# Patient Record
Sex: Female | Born: 1940
Health system: Southern US, Community
[De-identification: ages and names within clinical notes are randomized; demographics above are authoritative.]

## PROBLEM LIST (undated history)

## (undated) DIAGNOSIS — Z972 Presence of dental prosthetic device (complete) (partial): Secondary | ICD-10-CM

## (undated) DIAGNOSIS — E669 Obesity, unspecified: Secondary | ICD-10-CM

## (undated) DIAGNOSIS — H269 Unspecified cataract: Secondary | ICD-10-CM

## (undated) DIAGNOSIS — E119 Type 2 diabetes mellitus without complications: Secondary | ICD-10-CM

## (undated) DIAGNOSIS — E785 Hyperlipidemia, unspecified: Secondary | ICD-10-CM

## (undated) DIAGNOSIS — H409 Unspecified glaucoma: Secondary | ICD-10-CM

## (undated) DIAGNOSIS — I10 Essential (primary) hypertension: Secondary | ICD-10-CM

## (undated) HISTORY — DX: Unspecified glaucoma: H40.9

## (undated) HISTORY — PX: BREAST BIOPSY: SHX20

## (undated) HISTORY — DX: Type 2 diabetes mellitus without complications: E11.9

## (undated) HISTORY — DX: Unspecified cataract: H26.9

## (undated) HISTORY — DX: Hyperlipidemia, unspecified: E78.5

## (undated) HISTORY — DX: Obesity, unspecified: E66.9

## (undated) HISTORY — DX: Essential (primary) hypertension: I10

---

## 1980-05-24 HISTORY — PX: BREAST EXCISIONAL BIOPSY: SUR124

## 1980-05-24 HISTORY — PX: BREAST LUMPECTOMY: SHX2

## 1984-05-24 HISTORY — PX: VAGINAL HYSTERECTOMY: SUR661

## 2004-06-03 ENCOUNTER — Ambulatory Visit: Payer: Self-pay | Admitting: Unknown Physician Specialty

## 2005-06-10 ENCOUNTER — Ambulatory Visit: Payer: Self-pay | Admitting: Unknown Physician Specialty

## 2006-08-03 ENCOUNTER — Ambulatory Visit: Payer: Self-pay | Admitting: Unknown Physician Specialty

## 2007-08-31 ENCOUNTER — Ambulatory Visit: Payer: Self-pay | Admitting: Unknown Physician Specialty

## 2008-04-24 ENCOUNTER — Ambulatory Visit: Payer: Self-pay | Admitting: Unknown Physician Specialty

## 2008-04-24 LAB — HM COLONOSCOPY: HM COLON: NORMAL

## 2008-09-02 ENCOUNTER — Ambulatory Visit: Payer: Self-pay | Admitting: Unknown Physician Specialty

## 2009-09-08 ENCOUNTER — Ambulatory Visit: Payer: Self-pay | Admitting: Unknown Physician Specialty

## 2010-08-17 LAB — HM DEXA SCAN: HM Dexa Scan: NORMAL

## 2010-09-11 ENCOUNTER — Ambulatory Visit: Payer: Self-pay | Admitting: Unknown Physician Specialty

## 2011-07-14 ENCOUNTER — Ambulatory Visit: Payer: Self-pay | Admitting: Unknown Physician Specialty

## 2011-09-22 ENCOUNTER — Ambulatory Visit: Payer: Self-pay | Admitting: Unknown Physician Specialty

## 2012-09-29 ENCOUNTER — Ambulatory Visit: Payer: Self-pay | Admitting: Family Medicine

## 2013-10-10 ENCOUNTER — Ambulatory Visit: Payer: Self-pay | Admitting: Family Medicine

## 2013-10-10 LAB — HM MAMMOGRAPHY: HM MAMMO: NORMAL

## 2013-11-13 LAB — HEPATIC FUNCTION PANEL
ALT: 12 U/L (ref 7–35)
AST: 14 U/L (ref 13–35)

## 2013-11-13 LAB — BASIC METABOLIC PANEL
Creatinine: 0.6 mg/dL (ref <=1.1)
GLUCOSE: 130 mg/dL

## 2013-11-13 LAB — LIPID PANEL
Cholesterol: 154 mg/dL (ref 0–200)
HDL: 54 mg/dL (ref 35–70)
LDL CALC: 61 mg/dL
TRIGLYCERIDES: 193 mg/dL — AB (ref 40–160)

## 2014-07-25 LAB — HEMOGLOBIN A1C: Hgb A1c MFr Bld: 6.6 % — AB (ref 4.0–6.0)

## 2014-08-24 DIAGNOSIS — IMO0002 Reserved for concepts with insufficient information to code with codable children: Secondary | ICD-10-CM | POA: Insufficient documentation

## 2014-08-24 DIAGNOSIS — E1169 Type 2 diabetes mellitus with other specified complication: Secondary | ICD-10-CM | POA: Insufficient documentation

## 2014-08-24 DIAGNOSIS — H409 Unspecified glaucoma: Secondary | ICD-10-CM | POA: Insufficient documentation

## 2014-08-24 DIAGNOSIS — D229 Melanocytic nevi, unspecified: Secondary | ICD-10-CM | POA: Insufficient documentation

## 2014-08-24 DIAGNOSIS — I152 Hypertension secondary to endocrine disorders: Secondary | ICD-10-CM | POA: Insufficient documentation

## 2014-08-24 DIAGNOSIS — I10 Essential (primary) hypertension: Secondary | ICD-10-CM | POA: Insufficient documentation

## 2014-08-24 DIAGNOSIS — H269 Unspecified cataract: Secondary | ICD-10-CM | POA: Insufficient documentation

## 2014-08-24 DIAGNOSIS — E785 Hyperlipidemia, unspecified: Secondary | ICD-10-CM | POA: Insufficient documentation

## 2014-09-20 ENCOUNTER — Other Ambulatory Visit: Payer: Self-pay

## 2014-09-20 DIAGNOSIS — Z1231 Encounter for screening mammogram for malignant neoplasm of breast: Secondary | ICD-10-CM

## 2014-10-22 ENCOUNTER — Ambulatory Visit
Admission: RE | Admit: 2014-10-22 | Discharge: 2014-10-22 | Disposition: A | Discharge status: Home / Self Care | Payer: Medicare Other | Source: Ambulatory Visit

## 2014-10-22 DIAGNOSIS — Z1231 Encounter for screening mammogram for malignant neoplasm of breast: Secondary | ICD-10-CM | POA: Insufficient documentation

## 2014-10-30 ENCOUNTER — Encounter: Payer: Self-pay | Admitting: Family Medicine

## 2014-10-30 ENCOUNTER — Encounter (INDEPENDENT_AMBULATORY_CARE_PROVIDER_SITE_OTHER): Payer: Self-pay

## 2014-10-30 ENCOUNTER — Ambulatory Visit (INDEPENDENT_AMBULATORY_CARE_PROVIDER_SITE_OTHER): Payer: Medicare Other | Admitting: Family Medicine

## 2014-10-30 VITALS — BP 116/80 | HR 70 | Temp 97.9°F | Resp 16 | Ht 68.5 in | Wt 189.1 lb

## 2014-10-30 DIAGNOSIS — Z23 Encounter for immunization: Secondary | ICD-10-CM | POA: Diagnosis not present

## 2014-10-30 DIAGNOSIS — Z7189 Other specified counseling: Secondary | ICD-10-CM

## 2014-10-30 DIAGNOSIS — Z719 Counseling, unspecified: Secondary | ICD-10-CM

## 2014-10-30 DIAGNOSIS — Z0000 Encounter for general adult medical examination without abnormal findings: Secondary | ICD-10-CM | POA: Diagnosis not present

## 2014-10-30 DIAGNOSIS — E663 Overweight: Secondary | ICD-10-CM

## 2014-10-30 NOTE — Patient Instructions (Addendum)
   Adrienne Anderson , Thank you for taking time to come for your Medicare Wellness Visit. I appreciate your ongoing commitment to your health goals. Please review the following plan we discussed and let me know if I can assist you in the future.    This is a list of the screening recommended for you and due dates:  Health Maintenance  Topic Date Due  . Pneumonia vaccines (2 of 2 - PCV13) 05/03/2013  . Flu Shot  12/23/2014  . Hemoglobin A1C  01/25/2015  . Urine Protein Check  07/25/2015  . Eye exam for diabetics  07/30/2015  . Complete foot exam   10/30/2015  . Mammogram  10/21/2016  . Colon Cancer Screening  04/24/2018  . Tetanus Vaccine  08/04/2022  . DEXA scan (bone density measurement)  Completed  . Shingles Vaccine  Completed

## 2014-10-30 NOTE — Addendum Note (Signed)
Addended by: Steele Sizer F on: 10/30/2014 10:29 AM   Modules accepted: Level of Service, SmartSet

## 2014-10-30 NOTE — Progress Notes (Addendum)
Name: Adrienne Anderson   MRN: [id number]    DOB: 12-Jan-1941   Date:10/30/2014       Progress Note  Subjective  Chief Complaint  Chief Complaint  Patient presents with  . Annual Exam    HPI Mrs. Friedl is doing well, she denies any problems at this time. She has advance directives, she had one fall last winter - slipped on ice when she went to get the paper, otherwise she had good balance, normal gait. She is independent at home, able to perform ADL and instrumental ADL. Very independent - living with husband.  Review all her preventive care. Due for pneumococcal 13    Overweight: she has lost 6 lbs since last visit, she has decrease the caloric intake during breakfast. Eating more fruit, she has also been working in her garden   Patient Active Problem List   Diagnosis Date Noted  . Glaucoma 08/24/2014  . Cataract 08/24/2014  . Well controlled diabetes mellitus 08/24/2014  . Adult BMI 30+ 08/24/2014  . Benign hypertension 08/24/2014  . Dyslipidemia 08/24/2014  . Benign melanoma 08/24/2014    Past Surgical History  Procedure Laterality Date  . Vaginal hysterectomy  1986  . Breast lumpectomy N/A 1982  . Breast biopsy Bilateral     rt/clip-02/27/03, let - all neg    Family History  Problem Relation Age of Onset  . Depression Mother   . Rheum arthritis Father   . Depression Sister   . Hypertension Sister   . Breast cancer Sister   . Alcohol abuse Brother   . Hypertension Brother   . Hypertension Sister   . Cancer Sister     lung  . Hypertension Sister   . Breast cancer Paternal Aunt   . Breast cancer Cousin   . Hypertension Sister   . Depression Sister     History   Social History  . Marital Status: Married    Spouse Name: N/A  . Number of Children: 2  . Years of Education: 16   Occupational History  . Retired    Social History Main Topics  . Smoking status: Never Smoker   . Smokeless tobacco: Not on file  . Alcohol Use: No     Comment: occasional  . Drug  Use: No  . Sexual Activity: Not Currently   Other Topics Concern  . Not on file   Social History Narrative     Current outpatient prescriptions:  .  aspirin 81 MG tablet, Take by mouth., Disp: , Rfl:  .  atorvastatin (LIPITOR) 40 MG tablet, Take by mouth., Disp: , Rfl:  .  Blood Glucose Monitoring Suppl KIT, ONETOUCH TEST (In Vitro Strip)  1 (one) Strip check fsbs daily for 90 days  Quantity: 3;  Refills: 1   Ordered :03-Aug-2012  Steele Sizer MD;  Buddy Duty 03-Aug-2012 Active Comments: Ulra 2, Disp: , Rfl:  .  latanoprost (XALATAN) 0.005 % ophthalmic solution, Apply to eye., Disp: , Rfl:  .  metFORMIN (GLUCOPHAGE) 1000 MG tablet, Take by mouth., Disp: , Rfl:  .  MULTIPLE VITAMIN PO, Take by mouth., Disp: , Rfl:  .  telmisartan-hydrochlorothiazide (MICARDIS HCT) 80-25 MG per tablet, Take by mouth., Disp: , Rfl:  .  Timolol Maleate PF 0.25 % SOLN, Apply to eye., Disp: , Rfl:   No Known Allergies   ROS  Constitutional: Negative for fever or weight change.  Respiratory: Negative for cough and shortness of breath.   Cardiovascular: Negative for chest pain or palpitations.  Gastrointestinal: Negative for abdominal pain, no bowel changes.  Musculoskeletal: Negative for gait problem or joint swelling.  Skin: Negative for rash.  Neurological: Negative for dizziness or headache.  No other specific complaints in a complete review of systems (except as listed in HPI above).  Objective  Filed Vitals:   10/30/14 0842  BP: 116/80  Pulse: 70  Temp: 97.9 F (36.6 C)  TempSrc: Oral  Resp: 16  Height: 5' 8.5" (1.74 m)  Weight: 189 lb 1.6 oz (85.775 kg)  SpO2: 95%    Physical Exam   Constitutional: Patient appears well-developed and well-nourished. No distress.  HENT: Head: Normocephalic and atraumatic. Ears: B TMs ok, no erythema or effusion; Nose: Nose normal. Mouth/Throat: Oropharynx is clear and moist. No oropharyngeal exudate.  Eyes: Conjunctivae and EOM are normal. Pupils  are equal, round, and reactive to light. No scleral icterus.  Neck: Normal range of motion. Neck supple. No JVD present. No thyromegaly present.  Cardiovascular: Normal rate, regular rhythm and normal heart sounds.  No murmur heard. No BLE edema. Pulmonary/Chest: Effort normal and breath sounds normal. No respiratory distress. Abdominal: Soft.  no distension. There is no tenderness. no masses Breast: no lumps or masses, no nipple discharge or rashes FEMALE GENITALIA:  External genitalia normal External urethra normal Musculoskeletal: Normal range of motion, no joint effusions. No gross deformities Neurological: he is alert and oriented to person, place, and time. No cranial nerve deficit. Coordination, balance, strength, speech and gait are normal.  Skin: Skin is warm and dry. No rash noted. No erythema. She has multiple moles and see dermatologist  Psychiatric: Patient has a normal mood and affect. behavior is normal. Judgment and thought content normal.  Depression screen PHQ 2/9 10/30/2014  Decreased Interest 0  Down, Depressed, Hopeless 0  PHQ - 2 Score 0   Fall Risk: Fall Risk  10/30/2014  Falls in the past year? Yes  Number falls in past yr: 1  Injury with Fall? No  Risk for fall due to : Other (Comment)     Assessment & Plan   1. Medicare annual wellness visit, subsequent Questions answered. Discussed current USPTF guidelines during CPE   Functional ability/safety issues: No Issues Hearing issues: Addressed  Activities of daily living: Discussed Home safety issues: No Issues  End Of Life Planning: Offered verbal information regarding advanced directives, healthcare power of attorney.  Preventative care, Health maintenance, Preventative health measures discussed.  Preventative screenings discussed today: lab work, colonoscopy, PAP, mammogram, DEXA.  Low Dose CT Chest recommended if Age 72-80 years, 30 pack-year currently smoking OR have quit w/in 15years.   Lifestyle  risk factor issued reviewed: Diet, exercise, weight management, advised patient smoking is not healthy, nutrition/diet.  Preventative health measures discussed (5-10 year plan).  Reviewed and recommended vaccinations: - Pneumovax  - Prevnar  - Annual Influenza - Zostavax - Tdap   Depression screening: Done Fall risk screening: Done Discuss ADLs/IADLs: Done  Current medical providers: See HPI  Other health risk factors identified this visit: No other issues Cognitive impairment issues: None identified  All above discussed with patient. Appropriate education, counseling and referral will be made based upon the above.    2. Overweight (BMI 25.0-29.9) Discussed importance of walking 30 minutes daily   3. Health counseling Discussed importance of 150 minutes of physical activity weekly, eat two servings of fish weekly, eat one serving of tree nuts ( cashews, pistachios, pecans, almonds.Marland Kitchen) every other day, eat 6 servings of fruit/vegetables daily and drink plenty  of water and avoid sweet beverages.   4. Need for pneumococcal vaccination  - Pneumococcal conjugate vaccine 13-valent

## 2014-11-27 ENCOUNTER — Encounter: Payer: Self-pay | Admitting: Family Medicine

## 2014-11-27 ENCOUNTER — Ambulatory Visit (INDEPENDENT_AMBULATORY_CARE_PROVIDER_SITE_OTHER): Payer: Medicare Other | Admitting: Family Medicine

## 2014-11-27 VITALS — BP 122/82 | HR 65 | Temp 98.0°F | Resp 16 | Ht 69.0 in | Wt 190.9 lb

## 2014-11-27 DIAGNOSIS — H409 Unspecified glaucoma: Secondary | ICD-10-CM | POA: Diagnosis not present

## 2014-11-27 DIAGNOSIS — E785 Hyperlipidemia, unspecified: Secondary | ICD-10-CM | POA: Diagnosis not present

## 2014-11-27 DIAGNOSIS — E0836 Diabetes mellitus due to underlying condition with diabetic cataract: Secondary | ICD-10-CM

## 2014-11-27 DIAGNOSIS — I10 Essential (primary) hypertension: Secondary | ICD-10-CM | POA: Diagnosis not present

## 2014-11-27 LAB — POCT GLYCOSYLATED HEMOGLOBIN (HGB A1C): HEMOGLOBIN A1C: 6.3

## 2014-11-27 MED ORDER — TELMISARTAN-HCTZ 80-25 MG PO TABS: 1.0000 | ORAL_TABLET | Freq: Every day | ORAL | Status: DC

## 2014-11-27 MED ORDER — ATORVASTATIN CALCIUM 40 MG PO TABS: 40.0000 mg | ORAL_TABLET | Freq: Every day | ORAL | Status: DC

## 2014-11-27 MED ORDER — METFORMIN HCL 1000 MG PO TABS: 1000.0000 mg | ORAL_TABLET | Freq: Every day | ORAL | Status: DC

## 2014-11-27 NOTE — Progress Notes (Signed)
Name: Adrienne Anderson   MRN: [id number]    DOB: 09/08/1940   Date:11/27/2014       Progress Note  Subjective  Chief Complaint  Chief Complaint  Patient presents with  . Medication Refill    3 month F/U  . Diabetes    Checks a couple of times a week,  Low-98 Average-126 High-153  . Hypertension  . Hyperlipidemia    HPI  DMII : she takes Metformin daily, she denies side effects of medication, fsbs has been at goal.  She denies polyphagia, polydipsia or polyuria.  Eye exam is up to date, she has glaucoma and cataract, but is stable  HTN: denies side effects of medication, no chest pain or palpitation, compliant with Telmisartan/HCTZ and bp has been at goal.  Hyperlipidemia: taking Atorvastatin, denies side effects, last labs done December 2015 and at goal.   Patient Active Problem List   Diagnosis Date Noted  . Glaucoma 08/24/2014  . Cataract 08/24/2014  . Diabetes mellitus with glaucoma, due to underlying condition, with cataract 08/24/2014  . Benign hypertension 08/24/2014  . Dyslipidemia 08/24/2014    Past Surgical History  Procedure Laterality Date  . Vaginal hysterectomy  1986  . Breast lumpectomy N/A 1982  . Breast biopsy Bilateral     rt/clip-02/27/03, let - all neg    Family History  Problem Relation Age of Onset  . Depression Mother   . Rheum arthritis Father   . Depression Sister   . Hypertension Sister   . Breast cancer Sister   . Alcohol abuse Brother   . Hypertension Brother   . Hypertension Sister   . Cancer Sister     lung  . Hypertension Sister   . Breast cancer Paternal Aunt   . Breast cancer Cousin   . Hypertension Sister   . Depression Sister     History   Social History  . Marital Status: Married    Spouse Name: N/A  . Number of Children: 2  . Years of Education: 16   Occupational History  . Retired    Social History Main Topics  . Smoking status: Never Smoker   . Smokeless tobacco: Never Used  . Alcohol Use: No  . Drug Use: No   . Sexual Activity: Not Currently   Other Topics Concern  . Not on file   Social History Narrative     Current outpatient prescriptions:  .  aspirin 81 MG tablet, Take by mouth., Disp: , Rfl:  .  atorvastatin (LIPITOR) 40 MG tablet, Take by mouth., Disp: , Rfl:  .  Blood Glucose Monitoring Suppl KIT, ONETOUCH TEST (In Vitro Strip)  1 (one) Strip check fsbs daily for 90 days  Quantity: 3;  Refills: 1   Ordered :03-Aug-2012  SOWLES, KRICHNA MD;  Started 03-Aug-2012 Active Comments: Ulra 2, Disp: , Rfl:  .  latanoprost (XALATAN) 0.005 % ophthalmic solution, Apply to eye., Disp: , Rfl:  .  metFORMIN (GLUCOPHAGE) 1000 MG tablet, Take by mouth., Disp: , Rfl:  .  MULTIPLE VITAMIN PO, Take by mouth., Disp: , Rfl:  .  telmisartan-hydrochlorothiazide (MICARDIS HCT) 80-25 MG per tablet, Take by mouth., Disp: , Rfl:  .  Timolol Maleate PF 0.25 % SOLN, Apply to eye., Disp: , Rfl:   No Known Allergies   ROS  Constitutional: Negative for fever or weight change.  Respiratory: Negative for cough and shortness of breath.   Cardiovascular: Negative for chest pain or palpitations.  Gastrointestinal: Negative for abdominal pain,   no bowel changes.  Musculoskeletal: Negative for gait problem or joint swelling.  Skin: Negative for rash.  Neurological: Negative for dizziness or headache.  No other specific complaints in a complete review of systems (except as listed in HPI above).  Objective  Filed Vitals:   11/27/14 0829  BP: 122/82  Pulse: 65  Temp: 98 F (36.7 C)  TempSrc: Oral  Resp: 16  Height: 5' 9" (1.753 m)  Weight: 190 lb 14.4 oz (86.592 kg)  SpO2: 97%    Body mass index is 28.18 kg/(m^2).  Physical Exam  Constitutional: Patient appears well-developed and well-nourished. No distress.  HENT: Head: Normocephalic and atraumatic.  Nose: Nose normal. Mouth/Throat: Oropharynx is clear and moist. No oropharyngeal exudate.  Eyes: Conjunctivae and EOM are normal. Pupils are equal,  round, and reactive to light. No scleral icterus.  Neck: Normal range of motion. Neck supple. No JVD present. No thyromegaly present.  Cardiovascular: Normal rate, regular rhythm and normal heart sounds.  No murmur heard. No BLE edema. Pulmonary/Chest: Effort normal and breath sounds normal. No respiratory distress. Abdominal: Soft. Bowel sounds are normal, no distension. There is no tenderness. no masses Musculoskeletal: Normal range of motion, no joint effusions. No gross deformities Neurological: he is alert and oriented to person, place, and time. No cranial nerve deficit. Coordination, balance, strength, speech and gait are normal.  Skin: Skin is warm and dry. No rash noted. No erythema.  Psychiatric: Patient has a normal mood and affect. behavior is normal. Judgment and thought content normal.  Recent Results (from the past 2160 hour(s))  POCT HgB A1C     Status: None   Collection Time: 11/27/14  8:44 AM  Result Value Ref Range   Hemoglobin A1C 6.3     Diabetic Foot Exam - Simple   Simple Foot Form  Visual Inspection  No deformities, no ulcerations, no other skin breakdown bilaterally:  Yes  Sensation Testing  Intact to touch and monofilament testing bilaterally:  Yes  Pulse Check  Posterior Tibialis and Dorsalis pulse intact bilaterally:  Yes  Comments        PHQ2/9: Depression screen PHQ 2/9 10/30/2014  Decreased Interest 0  Down, Depressed, Hopeless 0  PHQ - 2 Score 0     Fall Risk: Fall Risk  10/30/2014  Falls in the past year? Yes  Number falls in past yr: 1  Injury with Fall? No  Risk for fall due to : Other (Comment)  Follow up Falls prevention discussed    Assessment & Plan  1. Diabetes mellitus due to underlying condition with diabetic cataract At goal, doing well - POCT HgB A1C - metFORMIN (GLUCOPHAGE) 1000 MG tablet; Take 1 tablet (1,000 mg total) by mouth daily with breakfast.  Dispense: 90 tablet; Refill: 1  2. Benign hypertension  -  telmisartan-hydrochlorothiazide (MICARDIS HCT) 80-25 MG per tablet; Take 1 tablet by mouth daily.  Dispense: 90 tablet; Refill: 1 - Comprehensive Metabolic Panel (CMET)  3. Dyslipidemia  - atorvastatin (LIPITOR) 40 MG tablet; Take 1 tablet (40 mg total) by mouth daily at 6 PM.  Dispense: 90 tablet; Refill: 1 - Lipid Profile  4. Glaucoma Continue follow up with opthalmologist

## 2014-11-27 NOTE — Patient Instructions (Signed)
Mederma Cream is over the counter for scars.

## 2014-11-28 LAB — COMPREHENSIVE METABOLIC PANEL
ALBUMIN: 4.3 g/dL (ref 3.5–4.8)
ALT: 14 IU/L (ref 0–32)
AST: 17 IU/L (ref 0–40)
Albumin/Globulin Ratio: 1.6 (ref 1.1–2.5)
Alkaline Phosphatase: 83 IU/L (ref 39–117)
BUN/Creatinine Ratio: 17 (ref 11–26)
BUN: 13 mg/dL (ref 8–27)
Bilirubin Total: 0.7 mg/dL (ref 0.0–1.2)
CHLORIDE: 101 mmol/L (ref 97–108)
CO2: 27 mmol/L (ref 18–29)
Calcium: 9.5 mg/dL (ref 8.7–10.3)
Creatinine, Ser: 0.75 mg/dL (ref 0.57–1.00)
GFR calc Af Amer: 93 mL/min/{1.73_m2} (ref >59)
GFR calc non Af Amer: 80 mL/min/{1.73_m2} (ref >59)
Globulin, Total: 2.7 g/dL (ref 1.5–4.5)
Glucose: 130 mg/dL — ABNORMAL HIGH (ref 65–99)
POTASSIUM: 3.9 mmol/L (ref 3.5–5.2)
Sodium: 143 mmol/L (ref 134–144)
TOTAL PROTEIN: 7 g/dL (ref 6.0–8.5)

## 2014-11-28 LAB — LIPID PANEL
Chol/HDL Ratio: 3.4 ratio units (ref 0.0–4.4)
Cholesterol, Total: 176 mg/dL (ref 100–199)
HDL: 52 mg/dL (ref >39)
LDL CALC: 92 mg/dL (ref 0–99)
Triglycerides: 160 mg/dL — ABNORMAL HIGH (ref 0–149)
VLDL Cholesterol Cal: 32 mg/dL (ref 5–40)

## 2015-03-06 ENCOUNTER — Ambulatory Visit (INDEPENDENT_AMBULATORY_CARE_PROVIDER_SITE_OTHER): Payer: Medicare Other

## 2015-03-06 DIAGNOSIS — Z23 Encounter for immunization: Secondary | ICD-10-CM | POA: Diagnosis not present

## 2015-05-30 ENCOUNTER — Encounter: Payer: Self-pay | Admitting: Family Medicine

## 2015-05-30 ENCOUNTER — Ambulatory Visit (INDEPENDENT_AMBULATORY_CARE_PROVIDER_SITE_OTHER): Payer: Medicare Other | Admitting: Family Medicine

## 2015-05-30 VITALS — BP 112/72 | HR 87 | Temp 98.0°F | Resp 16 | Ht 69.0 in | Wt 199.0 lb

## 2015-05-30 DIAGNOSIS — I10 Essential (primary) hypertension: Secondary | ICD-10-CM

## 2015-05-30 DIAGNOSIS — E785 Hyperlipidemia, unspecified: Secondary | ICD-10-CM

## 2015-05-30 DIAGNOSIS — H409 Unspecified glaucoma: Secondary | ICD-10-CM | POA: Diagnosis not present

## 2015-05-30 DIAGNOSIS — E0836 Diabetes mellitus due to underlying condition with diabetic cataract: Secondary | ICD-10-CM

## 2015-05-30 DIAGNOSIS — E0839 Diabetes mellitus due to underlying condition with other diabetic ophthalmic complication: Principal | ICD-10-CM

## 2015-05-30 DIAGNOSIS — H42 Glaucoma in diseases classified elsewhere: Principal | ICD-10-CM

## 2015-05-30 LAB — POCT GLYCOSYLATED HEMOGLOBIN (HGB A1C): HEMOGLOBIN A1C: 6.6

## 2015-05-30 MED ORDER — ATORVASTATIN CALCIUM 40 MG PO TABS: 40.0000 mg | ORAL_TABLET | Freq: Every day | ORAL | Status: DC

## 2015-05-30 MED ORDER — TELMISARTAN-HCTZ 80-25 MG PO TABS: 1.0000 | ORAL_TABLET | Freq: Every day | ORAL | Status: DC

## 2015-05-30 MED ORDER — METFORMIN HCL 1000 MG PO TABS: 1000.0000 mg | ORAL_TABLET | Freq: Every day | ORAL | Status: DC

## 2015-05-30 NOTE — Progress Notes (Signed)
Name: Adrienne Anderson   MRN: [id number]    DOB: Dec 24, 1940   Date:05/30/2015       Progress Note  Subjective  Chief Complaint  Chief Complaint  Patient presents with  . Diabetes    patient checks her blood sugar a couple time per week. highest = 140 lowest 118.  Marland Kitchen Hypertension    HPI   DMII : she takes Metformin daily, she denies side effects of medication, fsbs has been at goal. She denies polyphagia, polydipsia or polyuria. Eye exam is up to date, she has glaucoma and cataract, but is stable. She is only on Xalatan now, off Timolol. Going to [facility]. On 81 mg aspirin  HTN: denies side effects of medication, no chest pain or palpitation, no SOB, she is  compliant with Telmisartan/HCTZ and bp has been at goal.  Hyperlipidemia: taking Atorvastatin, denies myalgia  Patient Active Problem List   Diagnosis Date Noted  . Glaucoma 08/24/2014  . Cataract 08/24/2014  . Diabetes mellitus with glaucoma, due to underlying condition, with cataract 08/24/2014  . Benign hypertension 08/24/2014  . Dyslipidemia 08/24/2014    Past Surgical History  Procedure Laterality Date  . Vaginal hysterectomy  1986  . Breast lumpectomy N/A 1982  . Breast biopsy Bilateral     rt/clip-02/27/03, let - all neg    Family History  Problem Relation Age of Onset  . Depression Mother   . Rheum arthritis Father   . Depression Sister   . Hypertension Sister   . Breast cancer Sister   . Alcohol abuse Brother   . Hypertension Brother   . Hypertension Sister   . Cancer Sister     lung  . Hypertension Sister   . Breast cancer Paternal Aunt   . Breast cancer Cousin   . Hypertension Sister   . Depression Sister     Social History   Social History  . Marital Status: Married    Spouse Name: N/A  . Number of Children: 2  . Years of Education: 16   Occupational History  . Retired    Social History Main Topics  . Smoking status: Never Smoker   . Smokeless tobacco: Never Used  .  Alcohol Use: No  . Drug Use: No  . Sexual Activity: Not Currently   Other Topics Concern  . Not on file   Social History Narrative     Current outpatient prescriptions:  .  aspirin 81 MG tablet, Take by mouth., Disp: , Rfl:  .  atorvastatin (LIPITOR) 40 MG tablet, Take 1 tablet (40 mg total) by mouth daily at 6 PM., Disp: 90 tablet, Rfl: 1 .  Blood Glucose Monitoring Suppl KIT, ONETOUCH TEST (In Vitro Strip)  1 (one) Strip check fsbs daily for 90 days  Quantity: 3;  Refills: 1   Ordered :03-Aug-2012  Steele Sizer MD;  Buddy Duty 03-Aug-2012 Active Comments: Ulra 2, Disp: , Rfl:  .  latanoprost (XALATAN) 0.005 % ophthalmic solution, Apply to eye., Disp: , Rfl:  .  metFORMIN (GLUCOPHAGE) 1000 MG tablet, Take 1 tablet (1,000 mg total) by mouth daily with breakfast., Disp: 90 tablet, Rfl: 1 .  MULTIPLE VITAMIN PO, Take by mouth., Disp: , Rfl:  .  telmisartan-hydrochlorothiazide (MICARDIS HCT) 80-25 MG tablet, Take 1 tablet by mouth daily., Disp: 90 tablet, Rfl: 1  No Known Allergies   ROS  Constitutional: Negative for fever , positive for  weight change - gained 9 lbs since last visit  Respiratory: Negative for  cough and shortness of breath.   Cardiovascular: Negative for chest pain or palpitations.  Gastrointestinal: Negative for abdominal pain, no bowel changes.  Musculoskeletal: Negative for gait problem or joint swelling.  Skin: Negative for rash.  Neurological: Negative for dizziness or headache.  No other specific complaints in a complete review of systems (except as listed in HPI above).  Objective  Filed Vitals:   05/30/15 0808  BP: 112/72  Pulse: 87  Temp: 98 F (36.7 C)  TempSrc: Oral  Resp: 16  Height: 5' 9"  (1.753 m)  Weight: 199 lb (90.266 kg)  SpO2: 96%    Body mass index is 29.37 kg/(m^2).  Physical Exam  Constitutional: Patient appears well-developed and well-nourished. Obese No distress.  HEENT: head atraumatic, normocephalic, pupils equal and  reactive to light,  neck supple, throat within normal limits Cardiovascular: Normal rate, regular rhythm and normal heart sounds.  No murmur heard. Trace  BLE edema - both ankles. Pulmonary/Chest: Effort normal and breath sounds normal. No respiratory distress. Abdominal: Soft.  There is no tenderness. Psychiatric: Patient has a normal mood and affect. behavior is normal. Judgment and thought content normal.  Recent Results (from the past 2160 hour(s))  POCT glycosylated hemoglobin (Hb A1C)     Status: Abnormal   Collection Time: 05/30/15  8:13 AM  Result Value Ref Range   Hemoglobin A1C 6.6     PHQ2/9: Depression screen [facility] 2/9 05/30/2015 10/30/2014  Decreased Interest 0 0  Down, Depressed, Hopeless 0 0  PHQ - 2 Score 0 0     Fall Risk: Fall Risk  05/30/2015 10/30/2014  Falls in the past year? Yes Yes  Number falls in past yr: 1 1  Injury with Fall? Yes No  Risk for fall due to : - Other (Comment)  Follow up - Falls prevention discussed     Functional Status Survey: Is the patient deaf or have difficulty hearing?: No Does the patient have difficulty seeing, even when wearing glasses/contacts?: No Does the patient have difficulty concentrating, remembering, or making decisions?: No Does the patient have difficulty walking or climbing stairs?: No Does the patient have difficulty dressing or bathing?: No Does the patient have difficulty doing errands alone such as visiting a doctor's office or shopping?: No    Assessment & Plan  1. Diabetes mellitus with glaucoma, due to underlying condition, with cataract  - POCT glycosylated hemoglobin (Hb A1C) - metFORMIN (GLUCOPHAGE) 1000 MG tablet; Take 1 tablet (1,000 mg total) by mouth daily with breakfast.  Dispense: 90 tablet; Refill: 1  2. Benign hypertension  - telmisartan-hydrochlorothiazide (MICARDIS HCT) 80-25 MG tablet; Take 1 tablet by mouth daily.  Dispense: 90 tablet; Refill: 1  3. Dyslipidemia  - atorvastatin (LIPITOR)  40 MG tablet; Take 1 tablet (40 mg total) by mouth daily at 6 PM.  Dispense: 90 tablet; Refill: 1

## 2015-09-01 ENCOUNTER — Encounter: Payer: Self-pay | Admitting: *Deleted

## 2015-09-01 ENCOUNTER — Ambulatory Visit (INDEPENDENT_AMBULATORY_CARE_PROVIDER_SITE_OTHER): Payer: Medicare Other

## 2015-09-01 ENCOUNTER — Ambulatory Visit
Admission: EM | Admit: 2015-09-01 | Discharge: 2015-09-01 | Disposition: A | Discharge status: Home / Self Care | Payer: Medicare Other | Attending: Family Medicine | Admitting: Family Medicine

## 2015-09-01 DIAGNOSIS — J40 Bronchitis, not specified as acute or chronic: Secondary | ICD-10-CM | POA: Diagnosis not present

## 2015-09-01 DIAGNOSIS — J209 Acute bronchitis, unspecified: Secondary | ICD-10-CM

## 2015-09-01 MED ORDER — HYDROCOD POLST-CPM POLST ER 10-8 MG/5ML PO SUER: 5.0000 mL | Freq: Two times a day (BID) | ORAL | Status: DC

## 2015-09-01 MED ORDER — AZITHROMYCIN 250 MG PO TABS: ORAL_TABLET | ORAL | Status: DC

## 2015-09-01 MED ORDER — ALBUTEROL SULFATE HFA 108 (90 BASE) MCG/ACT IN AERS: 1.0000 | INHALATION_SPRAY | Freq: Four times a day (QID) | RESPIRATORY_TRACT | Status: DC | PRN

## 2015-09-01 NOTE — ED Notes (Signed)
URI type symptoms onset 1 week ago which for the most part have resolved except for a productive-yellow cough. Severe coughing on Sat/Sun with at time pink sputum.

## 2015-09-01 NOTE — Discharge Instructions (Signed)
How to Use an Inhaler Proper inhaler technique is very important. Good technique ensures that the medicine reaches the lungs. Poor technique results in depositing the medicine on the tongue and back of the throat rather than in the airways. If you do not use the inhaler with good technique, the medicine will not help you. STEPS TO FOLLOW IF USING AN INHALER WITHOUT AN EXTENSION TUBE  Remove the cap from the inhaler.  If you are using the inhaler for the first time, you will need to prime it. Shake the inhaler for 5 seconds and release four puffs into the air, away from your face. Ask your health care provider or pharmacist if you have questions about priming your inhaler.  Shake the inhaler for 5 seconds before each breath in (inhalation).  Position the inhaler so that the top of the canister faces up.  Put your index finger on the top of the medicine canister. Your thumb supports the bottom of the inhaler.  Open your mouth.  Either place the inhaler between your teeth and place your lips tightly around the mouthpiece, or hold the inhaler 1-2 inches away from your open mouth. If you are unsure of which technique to use, ask your health care provider.  Breathe out (exhale) normally and as completely as possible.  Press the canister down with your index finger to release the medicine.  At the same time as the canister is pressed, inhale deeply and slowly until your lungs are completely filled. This should take 4-6 seconds. Keep your tongue down.  Hold the medicine in your lungs for 5-10 seconds (10 seconds is best). This helps the medicine get into the small airways of your lungs.  Breathe out slowly, through pursed lips. Whistling is an example of pursed lips.  Wait at least 15-30 seconds between puffs. Continue with the above steps until you have taken the number of puffs your health care provider has ordered. Do not use the inhaler more than your health care provider tells  you.  Replace the cap on the inhaler.  Follow the directions from your health care provider or the inhaler insert for cleaning the inhaler. STEPS TO FOLLOW IF USING AN INHALER WITH AN EXTENSION (SPACER)  Remove the cap from the inhaler.  If you are using the inhaler for the first time, you will need to prime it. Shake the inhaler for 5 seconds and release four puffs into the air, away from your face. Ask your health care provider or pharmacist if you have questions about priming your inhaler.  Shake the inhaler for 5 seconds before each breath in (inhalation).  Place the open end of the spacer onto the mouthpiece of the inhaler.  Position the inhaler so that the top of the canister faces up and the spacer mouthpiece faces you.  Put your index finger on the top of the medicine canister. Your thumb supports the bottom of the inhaler and the spacer.  Breathe out (exhale) normally and as completely as possible.  Immediately after exhaling, place the spacer between your teeth and into your mouth. Close your lips tightly around the spacer.  Press the canister down with your index finger to release the medicine.  At the same time as the canister is pressed, inhale deeply and slowly until your lungs are completely filled. This should take 4-6 seconds. Keep your tongue down and out of the way.  Hold the medicine in your lungs for 5-10 seconds (10 seconds is best). This helps the  medicine get into the small airways of your lungs. Exhale.  Repeat inhaling deeply through the spacer mouthpiece. Again hold that breath for up to 10 seconds (10 seconds is best). Exhale slowly. If it is difficult to take this second deep breath through the spacer, breathe normally several times through the spacer. Remove the spacer from your mouth.  Wait at least 15-30 seconds between puffs. Continue with the above steps until you have taken the number of puffs your health care provider has ordered. Do not use the  inhaler more than your health care provider tells you.  Remove the spacer from the inhaler, and place the cap on the inhaler.  Follow the directions from your health care provider or the inhaler insert for cleaning the inhaler and spacer. If you are using different kinds of inhalers, use your quick relief medicine to open the airways 10-15 minutes before using a steroid if instructed to do so by your health care provider. If you are unsure which inhalers to use and the order of using them, ask your health care provider, nurse, or respiratory therapist. If you are using a steroid inhaler, always rinse your mouth with water after your last puff, then gargle and spit out the water. Do not swallow the water. AVOID:  Inhaling before or after starting the spray of medicine. It takes practice to coordinate your breathing with triggering the spray.  Inhaling through the nose (rather than the mouth) when triggering the spray. HOW TO DETERMINE IF YOUR INHALER IS FULL OR NEARLY EMPTY You cannot know when an inhaler is empty by shaking it. A few inhalers are now being made with dose counters. Ask your health care provider for a prescription that has a dose counter if you feel you need that extra help. If your inhaler does not have a counter, ask your health care provider to help you determine the date you need to refill your inhaler. Write the refill date on a calendar or your inhaler canister. Refill your inhaler 7-10 days before it runs out. Be sure to keep an adequate supply of medicine. This includes making sure it is not expired, and that you have a spare inhaler.  SEEK MEDICAL CARE IF:   Your symptoms are only partially relieved with your inhaler.  You are having trouble using your inhaler.  You have some increase in phlegm. SEEK IMMEDIATE MEDICAL CARE IF:   You feel little or no relief with your inhalers. You are still wheezing and are feeling shortness of breath or tightness in your chest or  both.  You have dizziness, headaches, or a fast heart rate.  You have chills, fever, or night sweats.  You have a noticeable increase in phlegm production, or there is blood in the phlegm. MAKE SURE YOU:   Understand these instructions.  Will watch your condition.  Will get help right away if you are not doing well or get worse.   This information is not intended to replace advice given to you by your health care provider. Make sure you discuss any questions you have with your health care provider.   Document Released: 05/07/2000 Document Revised: 02/28/2013 Document Reviewed: 12/07/2012 Elsevier Interactive Patient Education 2016 [employer]  Upper Respiratory Infection, Adult Most upper respiratory infections (URIs) are a viral infection of the air passages leading to the lungs. A URI affects the nose, throat, and upper air passages. The most common type of URI is nasopharyngitis and is typically referred to as "the common cold."  URIs run their course and usually go away on their own. Most of the time, a URI does not require medical attention, but sometimes a bacterial infection in the upper airways can follow a viral infection. This is called a secondary infection. Sinus and middle ear infections are common types of secondary upper respiratory infections. Bacterial pneumonia can also complicate a URI. A URI can worsen asthma and chronic obstructive pulmonary disease (COPD). Sometimes, these complications can require emergency medical care and may be life threatening.  CAUSES Almost all URIs are caused by viruses. A virus is a type of germ and can spread from one person to another.  RISKS FACTORS You may be at risk for a URI if:   You smoke.   You have chronic heart or lung disease.  You have a weakened defense (immune) system.   You are very young or very old.   You have nasal allergies or asthma.  You work in crowded or poorly ventilated areas.  You work in health  care facilities or schools. SIGNS AND SYMPTOMS  Symptoms typically develop 2-3 days after you come in contact with a cold virus. Most viral URIs last 7-10 days. However, viral URIs from the influenza virus (flu virus) can last 14-18 days and are typically more severe. Symptoms may include:   Runny or stuffy (congested) nose.   Sneezing.   Cough.   Sore throat.   Headache.   Fatigue.   Fever.   Loss of appetite.   Pain in your forehead, behind your eyes, and over your cheekbones (sinus pain).  Muscle aches.  DIAGNOSIS  Your health care provider may diagnose a URI by:  Physical exam.  Tests to check that your symptoms are not due to another condition such as:  Strep throat.  Sinusitis.  Pneumonia.  Asthma. TREATMENT  A URI goes away on its own with time. It cannot be cured with medicines, but medicines may be prescribed or recommended to relieve symptoms. Medicines may help:  Reduce your fever.  Reduce your cough.  Relieve nasal congestion. HOME CARE INSTRUCTIONS   Take medicines only as directed by your health care provider.   Gargle warm saltwater or take cough drops to comfort your throat as directed by your health care provider.  Use a warm mist humidifier or inhale steam from a shower to increase air moisture. This may make it easier to breathe.  Drink enough fluid to keep your urine clear or pale yellow.   Eat soups and other clear broths and maintain good nutrition.   Rest as needed.   Return to work when your temperature has returned to normal or as your health care provider advises. You may need to stay home longer to avoid infecting others. You can also use a face mask and careful hand washing to prevent spread of the virus.  Increase the usage of your inhaler if you have asthma.   Do not use any tobacco products, including cigarettes, chewing tobacco, or electronic cigarettes. If you need help quitting, ask your health care  provider. PREVENTION  The best way to protect yourself from getting a cold is to practice good hygiene.   Avoid oral or hand contact with people with cold symptoms.   Wash your hands often if contact occurs.  There is no clear evidence that vitamin C, vitamin E, echinacea, or exercise reduces the chance of developing a cold. However, it is always recommended to get plenty of rest, exercise, and practice good nutrition.  SEEK MEDICAL CARE IF:  °· You are getting worse rather than better.   °· Your symptoms are not controlled by medicine.   °· You have chills. °· You have worsening shortness of breath. °· You have brown or red mucus. °· You have yellow or brown nasal discharge. °· You have pain in your face, especially when you bend forward. °· You have a fever. °· You have swollen neck glands. °· You have pain while swallowing. °· You have white areas in the back of your throat. °SEEK IMMEDIATE MEDICAL CARE IF:  °· You have severe or persistent: °¨ Headache. °¨ Ear pain. °¨ Sinus pain. °¨ Chest pain. °· You have chronic lung disease and any of the following: °¨ Wheezing. °¨ Prolonged cough. °¨ Coughing up blood. °¨ A change in your usual mucus. °· You have a stiff neck. °· You have changes in your: °¨ Vision. °¨ Hearing. °¨ Thinking. °¨ Mood. °MAKE SURE YOU:  °· Understand these instructions. °· Will watch your condition. °· Will get help right away if you are not doing well or get worse. °  °This information is not intended to replace advice given to you by your health care provider. Make sure you discuss any questions you have with your health care provider. °  °Document Released: 11/03/2000 Document Revised: 09/24/2014 Document Reviewed: 08/15/2013 °Elsevier Interactive Patient Education ©2016 Elsevier Inc. ° °

## 2015-09-01 NOTE — ED Provider Notes (Signed)
CSN: [id number]     Arrival date & time 09/01/15  1105 History   First MD Initiated Contact with Patient 09/01/15 1259     Chief Complaint  Patient presents with  . Cough   (Consider location/radiation/quality/duration/timing/severity/associated sxs/prior Treatment) HPI  This 75 year old female who presents with a one to two-week history of severe coughing and congestion. She's been having some greenish sputum production,at times blood-tinged mucus. She does have some shortness of breath. She is a nonsmoker but has had secondhand smoke through her husband smoking. They've been married for 51 years. She has not had any fever that she is aware of but has had chills. Temperature is 90.8 degrees pulse 74 respirations 16 blood pressure 120/74 O2 sat on room air 94%      Past Medical History  Diagnosis Date  . Hypertension   . Hyperlipidemia   . Diabetes mellitus without complication ([place])   . Cataract   . Glaucoma   . Obesity    Past Surgical History  Procedure Laterality Date  . Vaginal hysterectomy  1986  . Breast lumpectomy N/A 1982  . Breast biopsy Bilateral     rt/clip-02/27/03, let - all neg   Family History  Problem Relation Age of Onset  . Depression Mother   . Rheum arthritis Father   . Depression Sister   . Hypertension Sister   . Breast cancer Sister   . Alcohol abuse Brother   . Hypertension Brother   . Hypertension Sister   . Cancer Sister     lung  . Hypertension Sister   . Breast cancer Paternal Aunt   . Breast cancer Cousin   . Hypertension Sister   . Depression Sister    Social History  Substance Use Topics  . Smoking status: Never Smoker   . Smokeless tobacco: Never Used  . Alcohol Use: No   OB History    No data available     Review of Systems  Constitutional: Positive for chills, activity change and fatigue. Negative for fever.  HENT: Positive for congestion, postnasal drip, rhinorrhea and sinus pressure.   Respiratory: Positive for cough,  shortness of breath and wheezing. Negative for stridor.   All other systems reviewed and are negative.   Allergies  Review of patient's allergies indicates no known allergies.  Home Medications   Prior to Admission medications   Medication Sig Start Date End Date Taking? Authorizing Provider  aspirin 81 MG tablet Take by mouth.   Yes Historical Provider, MD  atorvastatin (LIPITOR) 40 MG tablet Take 1 tablet (40 mg total) by mouth daily at 6 PM. 05/30/15  Yes Steele Sizer, MD  Blood Glucose Monitoring Suppl KIT ONETOUCH TEST (In Vitro Strip)  1 (one) Strip check fsbs daily for 90 days  Quantity: 3;  Refills: 1   Ordered :03-Aug-2012  Steele Sizer MD;  Buddy Duty 03-Aug-2012 Active Comments: Ulra 2 08/03/12  Yes Historical Provider, MD  latanoprost (XALATAN) 0.005 % ophthalmic solution Apply to eye.   Yes Historical Provider, MD  metFORMIN (GLUCOPHAGE) 1000 MG tablet Take 1 tablet (1,000 mg total) by mouth daily with breakfast. 05/30/15  Yes Steele Sizer, MD  MULTIPLE VITAMIN PO Take by mouth.   Yes Historical Provider, MD  telmisartan-hydrochlorothiazide (MICARDIS HCT) 80-25 MG tablet Take 1 tablet by mouth daily. 05/30/15  Yes Steele Sizer, MD  albuterol (PROVENTIL HFA;VENTOLIN HFA) 108 (90 Base) MCG/ACT inhaler Inhale 1-2 puffs into the lungs every 6 (six) hours as needed for wheezing or shortness of breath.  09/01/15   Lorin Picket, PA-C  azithromycin (ZITHROMAX Z-PAK) 250 MG tablet Use as per package instructions 09/01/15   Lorin Picket, PA-C  chlorpheniramine-HYDROcodone [facility] ER) 10-8 MG/5ML SUER Take 5 mLs by mouth 2 (two) times daily. 09/01/15   Lorin Picket, PA-C   Meds Ordered and Administered this Visit  Medications - No data to display  BP 120/74 mmHg  Pulse 74  Temp(Src) 98 F (36.7 C) (Oral)  Resp 16  Ht 5' 8"  (1.727 m)  Wt 196 lb (88.905 kg)  BMI 29.81 kg/m2  SpO2 94% No data found.   Physical Exam  Constitutional: She is oriented to person,  place, and time. She appears well-developed and well-nourished. No distress.  HENT:  Head: Normocephalic and atraumatic.  Right Ear: External ear normal.  Left Ear: External ear normal.  Nose: Nose normal.  Mouth/Throat: Oropharynx is clear and moist. No oropharyngeal exudate.  Eyes: Conjunctivae are normal. Pupils are equal, round, and reactive to light. Right eye exhibits no discharge. Left eye exhibits no discharge.  Neck: Normal range of motion. Neck supple.  Pulmonary/Chest: Effort normal. She has wheezes. She has rales.  Musculoskeletal: Normal range of motion. She exhibits no edema or tenderness.  Lymphadenopathy:    She has no cervical adenopathy.  Neurological: She is alert and oriented to person, place, and time.  Skin: Skin is warm and dry. No rash noted. She is not diaphoretic.  Psychiatric: She has a normal mood and affect. Her behavior is normal. Judgment and thought content normal.  Nursing note and vitals reviewed.   ED Course  Procedures (including critical care time)  Labs Review Labs Reviewed - No data to display  Imaging Review Dg Chest 2 View  09/01/2015  CLINICAL DATA:  Productive cough and chest congestion. Chest tightness. EXAM: CHEST  2 VIEW COMPARISON:  None. FINDINGS: There is borderline cardiomegaly. Pulmonary vascularity is normal. Peribronchial thickening. No infiltrates or effusions. Degenerative changes in the thoracic spine. IMPRESSION: Slight bronchitic changes. Electronically Signed   By: Lorriane Shire M.D.   On: 09/01/2015 13:31     Visual Acuity Review  Right Eye Distance:   Left Eye Distance:   Bilateral Distance:    Right Eye Near:   Left Eye Near:    Bilateral Near:         MDM   1. Bronchitis with bronchospasm    New Prescriptions   ALBUTEROL (PROVENTIL HFA;VENTOLIN HFA) 108 (90 BASE) MCG/ACT INHALER    Inhale 1-2 puffs into the lungs every 6 (six) hours as needed for wheezing or shortness of breath.   AZITHROMYCIN  (ZITHROMAX Z-PAK) 250 MG TABLET    Use as per package instructions   CHLORPHENIRAMINE-HYDROCODONE (TUSSIONEX PENNKINETIC ER) 10-8 MG/5ML SUER    Take 5 mLs by mouth 2 (two) times daily.  Plan: 1. Test/x-ray results and diagnosis reviewed with patient 2. rx as per orders; risks, benefits, potential side effects reviewed with patient 3. Recommend supportive treatment with Attempting to have her husband stop smoking. We discussed the possibility of steroid use however I will leave that up to her primary care she continues to have the cough. I will provide her with a albuterol inhaler with instructions. I've also asked her to take only half a dose of the Tussionex at nighttime CPAP be sufficient enough to allow her to have a restful sleep. She should follow-up with her primary care physician 4. F/u prn if symptoms worsen or don't improve  Lorin Picket, PA-C 09/01/15 1352

## 2015-09-08 ENCOUNTER — Ambulatory Visit (INDEPENDENT_AMBULATORY_CARE_PROVIDER_SITE_OTHER): Payer: Medicare Other | Admitting: Family Medicine

## 2015-09-08 ENCOUNTER — Encounter: Payer: Self-pay | Admitting: Family Medicine

## 2015-09-08 VITALS — BP 118/86 | HR 80 | Temp 97.7°F | Resp 16 | Ht 69.0 in | Wt 198.6 lb

## 2015-09-08 DIAGNOSIS — J209 Acute bronchitis, unspecified: Secondary | ICD-10-CM | POA: Diagnosis not present

## 2015-09-08 MED ORDER — FLUTICASONE FUROATE-VILANTEROL 100-25 MCG/INH IN AEPB: 1.0000 | INHALATION_SPRAY | Freq: Every day | RESPIRATORY_TRACT | Status: DC

## 2015-09-08 NOTE — Progress Notes (Signed)
Name: Adrienne Anderson   MRN: [id number]    DOB: [birth year]   Date:09/08/2015       Progress Note  Subjective  Chief Complaint  Chief Complaint  Patient presents with  . Follow-up    patient was recently seen at the [facility] on 09/01/15 and was diagnosed with Bronchitis with Bronchospams.  . Cough    persistent productive. sputum is a lighter color.    HPI  Acute Bronchitis: she states symptoms started with URI a couple of weeks ago but was severe last week and went to Urgent care. Finished a Z-pack and is taking Tussionex and Mucinex otc, still has some chest congestion, a productive cough but no SOB but has occasional wheezing at night. She is taking Ventolin prn. No fever at this time. She denies change in appetite, she had a subjective fever.    Patient Active Problem List   Diagnosis Date Noted  . Glaucoma 08/24/2014  . Cataract 08/24/2014  . Diabetes mellitus with glaucoma, due to underlying condition, with cataract 08/24/2014  . Benign hypertension 08/24/2014  . Dyslipidemia 08/24/2014    Past Surgical History  Procedure Laterality Date  . Vaginal hysterectomy  1986  . Breast lumpectomy N/A 1982  . Breast biopsy Bilateral     rt/clip-02/27/03, let - all neg    Family History  Problem Relation Age of Onset  . Depression Mother   . Rheum arthritis Father   . Depression Sister   . Hypertension Sister   . Breast cancer Sister   . Alcohol abuse Brother   . Hypertension Brother   . Hypertension Sister   . Cancer Sister     lung  . Hypertension Sister   . Breast cancer Paternal Aunt   . Breast cancer Cousin   . Hypertension Sister   . Depression Sister     Social History   Social History  . Marital Status: Married    Spouse Name: N/A  . Number of Children: 2  . Years of Education: 16   Occupational History  . Retired    Social History Main Topics  . Smoking status: Never Smoker   . Smokeless tobacco: Never Used  . Alcohol Use: No  . Drug  Use: No  . Sexual Activity: Not Currently   Other Topics Concern  . Not on file   Social History Narrative     Current outpatient prescriptions:  .  albuterol (PROVENTIL HFA;VENTOLIN HFA) 108 (90 Base) MCG/ACT inhaler, Inhale 1-2 puffs into the lungs every 6 (six) hours as needed for wheezing or shortness of breath., Disp: 1 Inhaler, Rfl: 0 .  aspirin 81 MG tablet, Take by mouth., Disp: , Rfl:  .  atorvastatin (LIPITOR) 40 MG tablet, Take 1 tablet (40 mg total) by mouth daily at 6 PM., Disp: 90 tablet, Rfl: 1 .  Blood Glucose Monitoring Suppl KIT, ONETOUCH TEST (In Vitro Strip)  1 (one) Strip check fsbs daily for 90 days  Quantity: 3;  Refills: 1   Ordered :03-Aug-2012  Steele Sizer MD;  Buddy Duty 03-Aug-2012 Active Comments: Ulra 2, Disp: , Rfl:  .  chlorpheniramine-HYDROcodone (TUSSIONEX PENNKINETIC ER) 10-8 MG/5ML SUER, Take 5 mLs by mouth 2 (two) times daily., Disp: 115 mL, Rfl: 0 .  fluticasone furoate-vilanterol (BREO ELLIPTA) 100-25 MCG/INH AEPB, Inhale 1 puff into the lungs daily., Disp: 60 each, Rfl: 0 .  latanoprost (XALATAN) 0.005 % ophthalmic solution, Apply to eye., Disp: , Rfl:  .  metFORMIN (GLUCOPHAGE) 1000 MG tablet,  Take 1 tablet (1,000 mg total) by mouth daily with breakfast., Disp: 90 tablet, Rfl: 1 .  MULTIPLE VITAMIN PO, Take by mouth., Disp: , Rfl:  .  telmisartan-hydrochlorothiazide (MICARDIS HCT) 80-25 MG tablet, Take 1 tablet by mouth daily., Disp: 90 tablet, Rfl: 1  No Known Allergies   ROS  Ten systems reviewed and is negative except as mentioned in HPI   Objective  Filed Vitals:   09/08/15 1031  BP: 118/86  Pulse: 80  Temp: 97.7 F (36.5 C)  TempSrc: Oral  Resp: 16  Height: 5' 9"  (1.753 m)  Weight: 198 lb 9.6 oz (90.084 kg)  SpO2: 96%    Body mass index is 29.31 kg/(m^2).  Physical Exam  Constitutional: Patient appears well-developed and well-nourished. Obese  No distress.  HEENT: head atraumatic, normocephalic, pupils equal and reactive  to light, ears normal TM bilaterally,  neck supple, throat within normal limits Cardiovascular: Normal rate, regular rhythm and normal heart sounds.  No murmur heard. No BLE edema. Pulmonary/Chest: Effort normal and bilateral rhoncho. No respiratory distress. Abdominal: Soft.  There is no tenderness. Psychiatric: Patient has a normal mood and affect. behavior is normal. Judgment and thought content normal.   PHQ2/9: Depression screen [facility] 2/9 09/08/2015 05/30/2015 10/30/2014  Decreased Interest 0 0 0  Down, Depressed, Hopeless 0 0 0  PHQ - 2 Score 0 0 0     Fall Risk: Fall Risk  09/08/2015 05/30/2015 10/30/2014  Falls in the past year? No Yes Yes  Number falls in past yr: - 1 1  Injury with Fall? - Yes No  Risk for fall due to : - - Other (Comment)  Follow up - - Falls prevention discussed    Functional Status Survey: Is the patient deaf or have difficulty hearing?: No Does the patient have difficulty seeing, even when wearing glasses/contacts?: No Does the patient have difficulty concentrating, remembering, or making decisions?: No Does the patient have difficulty walking or climbing stairs?: No Does the patient have difficulty dressing or bathing?: No Does the patient have difficulty doing errands alone such as visiting a doctor's office or shopping?: No    Assessment & Plan  1. Acute bronchitis, unspecified organism  Advised to continue Mucinex, explained that symptoms of cough may last up to 8 weeks - fluticasone furoate-vilanterol (BREO ELLIPTA) 100-25 MCG/INH AEPB; Inhale 1 puff into the lungs daily.  Dispense: 60 each; Refill: 0

## 2015-09-16 ENCOUNTER — Other Ambulatory Visit: Payer: Self-pay | Admitting: Family Medicine

## 2015-09-16 DIAGNOSIS — Z1231 Encounter for screening mammogram for malignant neoplasm of breast: Secondary | ICD-10-CM

## 2015-10-28 ENCOUNTER — Ambulatory Visit
Admission: RE | Admit: 2015-10-28 | Discharge: 2015-10-28 | Disposition: A | Discharge status: Home / Self Care | Payer: Medicare Other | Source: Ambulatory Visit | Attending: Family Medicine | Admitting: Family Medicine

## 2015-10-28 DIAGNOSIS — Z1231 Encounter for screening mammogram for malignant neoplasm of breast: Secondary | ICD-10-CM | POA: Insufficient documentation

## 2015-11-03 ENCOUNTER — Ambulatory Visit
Admission: RE | Admit: 2015-11-03 | Discharge: 2015-11-03 | Disposition: A | Discharge status: Home / Self Care | Payer: Medicare Other | Source: Ambulatory Visit | Attending: Family Medicine | Admitting: Family Medicine

## 2015-11-03 ENCOUNTER — Ambulatory Visit (INDEPENDENT_AMBULATORY_CARE_PROVIDER_SITE_OTHER): Payer: Medicare Other | Admitting: Family Medicine

## 2015-11-03 ENCOUNTER — Encounter: Payer: Self-pay | Admitting: Family Medicine

## 2015-11-03 VITALS — BP 114/78 | HR 91 | Temp 98.1°F | Resp 16 | Ht 69.0 in | Wt 200.9 lb

## 2015-11-03 DIAGNOSIS — H409 Unspecified glaucoma: Secondary | ICD-10-CM

## 2015-11-03 DIAGNOSIS — E785 Hyperlipidemia, unspecified: Secondary | ICD-10-CM | POA: Diagnosis not present

## 2015-11-03 DIAGNOSIS — E2839 Other primary ovarian failure: Secondary | ICD-10-CM | POA: Diagnosis present

## 2015-11-03 DIAGNOSIS — Z0000 Encounter for general adult medical examination without abnormal findings: Secondary | ICD-10-CM

## 2015-11-03 DIAGNOSIS — I10 Essential (primary) hypertension: Secondary | ICD-10-CM | POA: Diagnosis not present

## 2015-11-03 DIAGNOSIS — H9193 Unspecified hearing loss, bilateral: Secondary | ICD-10-CM

## 2015-11-03 DIAGNOSIS — N3941 Urge incontinence: Secondary | ICD-10-CM | POA: Diagnosis not present

## 2015-11-03 DIAGNOSIS — E0839 Diabetes mellitus due to underlying condition with other diabetic ophthalmic complication: Secondary | ICD-10-CM

## 2015-11-03 DIAGNOSIS — E0836 Diabetes mellitus due to underlying condition with diabetic cataract: Secondary | ICD-10-CM

## 2015-11-03 DIAGNOSIS — M8588 Other specified disorders of bone density and structure, other site: Secondary | ICD-10-CM | POA: Insufficient documentation

## 2015-11-03 DIAGNOSIS — H42 Glaucoma in diseases classified elsewhere: Secondary | ICD-10-CM

## 2015-11-03 MED ORDER — METFORMIN HCL 1000 MG PO TABS: 1000.0000 mg | ORAL_TABLET | Freq: Every day | ORAL | Status: DC

## 2015-11-03 MED ORDER — ATORVASTATIN CALCIUM 40 MG PO TABS: 40.0000 mg | ORAL_TABLET | Freq: Every day | ORAL | Status: DC

## 2015-11-03 MED ORDER — TELMISARTAN-HCTZ 80-25 MG PO TABS: 1.0000 | ORAL_TABLET | Freq: Every day | ORAL | Status: DC

## 2015-11-03 MED ORDER — GLUCOSE BLOOD VI STRP: ORAL_STRIP | Status: DC

## 2015-11-03 NOTE — Progress Notes (Signed)
Name: Adrienne Anderson   MRN: [id number]    DOB: Dec 09, 1940   Date:11/03/2015       Progress Note  Subjective  Chief Complaint  Chief Complaint  Patient presents with  . Annual Exam  . Medication Refill    HPI  Functional ability/safety issues: No Issues Hearing issues: Addressed - she is concerned about mild hearing loss Activities of daily living: Discussed Home safety issues: No Issues  End Of Life Planning: Offered verbal information regarding advanced directives, healthcare power of attorney.  Preventative care, Health maintenance, Preventative health measures discussed.  Preventative screenings discussed today: lab work, colonoscopy,  mammogram, DEXA.  Low Dose CT Chest recommended if Age 73-80 years, 30 pack-year currently smoking OR have quit w/in 15years.   Lifestyle risk factor issued reviewed: Diet, exercise, weight management, advised patient smoking is not healthy, nutrition/diet.  Preventative health measures discussed (5-10 year plan).  Reviewed and recommended vaccinations: - Pneumovax  - Prevnar  - Annual Influenza - Zostavax - Tdap   Depression screening: Done Fall risk screening: Done Discuss ADLs/IADLs: Done  Current medical providers: See HPI  Other health risk factors identified this visit: No other issues Cognitive impairment issues: None identified  All above discussed with patient. Appropriate education, counseling and referral will be made based upon the above.    DMII : she takes Metformin daily, she denies side effects of medication, fsbs at home is usually at goal, occasionally goes above 140 - highest recently was 173, average in the 140's. She has not been as active lately, she has been compliant with her diet. She denies polyphagia, polydipsia or polyuria. Eye exam is up to date, she has glaucoma and cataract, but is stable. She is only on Xalatan now, off Timolol. Going to [facility]. On 81 mg aspirin  HTN: denies side  effects of medication, no chest pain, palpitation, or SOB, she is compliant with Telmisartan/HCTZ and bp has been at goal.  Hyperlipidemia: taking Atorvastatin, denies myalgia   Patient Active Problem List   Diagnosis Date Noted  . Glaucoma 08/24/2014  . Cataract 08/24/2014  . Diabetes mellitus with glaucoma, due to underlying condition, with cataract 08/24/2014  . Benign hypertension 08/24/2014  . Dyslipidemia 08/24/2014    Past Surgical History  Procedure Laterality Date  . Vaginal hysterectomy  1986  . Breast lumpectomy N/A 1982  . Breast biopsy Bilateral     rt/clip-02/27/03, let - all neg    Family History  Problem Relation Age of Onset  . Depression Mother   . Rheum arthritis Father   . Depression Sister   . Hypertension Sister   . Breast cancer Sister   . Alcohol abuse Brother   . Hypertension Brother   . Hypertension Sister   . Cancer Sister     lung  . Hypertension Sister   . Breast cancer Paternal Aunt   . Breast cancer Cousin   . Hypertension Sister   . Depression Sister     Social History   Social History  . Marital Status: Married    Spouse Name: N/A  . Number of Children: 73  . Years of Education: 16   Occupational History  . Retired    Social History Main Topics  . Smoking status: Never Smoker   . Smokeless tobacco: Never Used  . Alcohol Use: No  . Drug Use: No  . Sexual Activity: Not Currently   Other Topics Concern  . Not on file   Social History Narrative  Current outpatient prescriptions:  .  aspirin 81 MG tablet, Take by mouth., Disp: , Rfl:  .  atorvastatin (LIPITOR) 40 MG tablet, Take 1 tablet (40 mg total) by mouth daily at 6 PM., Disp: 90 tablet, Rfl: 1 .  glucose blood (ONE TOUCH ULTRA TEST) test strip, Use as instructed, Disp: 100 each, Rfl: 12 .  latanoprost (XALATAN) 0.005 % ophthalmic solution, Apply to eye., Disp: , Rfl:  .  metFORMIN (GLUCOPHAGE) 1000 MG tablet, Take 1 tablet (1,000 mg total) by mouth daily with  breakfast., Disp: 90 tablet, Rfl: 1 .  MULTIPLE VITAMIN PO, Take by mouth., Disp: , Rfl:  .  telmisartan-hydrochlorothiazide (MICARDIS HCT) 80-25 MG tablet, Take 1 tablet by mouth daily., Disp: 90 tablet, Rfl: 1  No Known Allergies   ROS  Constitutional: Negative for fever or weight change.  Respiratory: Negative for cough and shortness of breath.   Cardiovascular: Negative for chest pain or palpitations.  Gastrointestinal: Negative for abdominal pain, no bowel changes.  Musculoskeletal: Negative for gait problem or joint swelling.  Skin: Negative for rash.  Neurological: Negative for dizziness or headache.  No other specific complaints in a complete review of systems (except as listed in HPI above).  Objective  Filed Vitals:   11/03/15 0827  BP: 114/78  Pulse: 91  Temp: 98.1 F (36.7 C)  TempSrc: Oral  Resp: 16  Height: 5\' 9"  (1.753 m)  Weight: 200 lb 14.4 oz (91.128 kg)  SpO2: 96%    Body mass index is 29.65 kg/(m^2).  Physical Exam  Constitutional: Patient appears well-developed and well-nourished. No distress.  HENT: Head: Normocephalic and atraumatic. Ears: B TMs ok, no erythema or effusion; Nose: Nose normal. Mouth/Throat: Oropharynx is clear and moist. No oropharyngeal exudate.  Eyes: Conjunctivae and EOM are normal. Pupils are equal, round, and reactive to light. No scleral icterus.  Neck: Normal range of motion. Neck supple. No JVD present. No thyromegaly present.  Cardiovascular: Normal rate, regular rhythm and normal heart sounds.  No murmur heard. No BLE edema. Pulmonary/Chest: Effort normal and breath sounds normal. No respiratory distress. Abdominal: Soft. Bowel sounds are normal, no distension. There is no tenderness. no masses Breast: not done FEMALE GENITALIA:  Not done RECTAL: not done Musculoskeletal: Normal range of motion, no joint effusions. No gross deformities Neurological: he is alert and oriented to person, place, and time. No cranial nerve  deficit. Coordination, balance, strength, speech and gait are normal.  Skin: Skin is warm and dry. No rash noted. No erythema.  Psychiatric: Patient has a normal mood and affect. behavior is normal. Judgment and thought content normal.   Diabetic Foot Exam: Diabetic Foot Exam - Simple   Simple Foot Form  Diabetic Foot exam was performed with the following findings:  Yes 11/03/2015  9:26 AM  Visual Inspection  No deformities, no ulcerations, no other skin breakdown bilaterally:  Yes  Sensation Testing  Intact to touch and monofilament testing bilaterally:  Yes  Pulse Check  Posterior Tibialis and Dorsalis pulse intact bilaterally:  Yes  Comments      Hearing Screening   125Hz  250Hz  500Hz  1000Hz  2000Hz  4000Hz  8000Hz   Right ear:   Pass Fail Pass Pass   Left ear:   Fail Fail Pass Pass     PHQ2/9: Depression screen [facility] 2/9 11/03/2015 09/08/2015 05/30/2015 10/30/2014  Decreased Interest 0 0 0 0  Down, Depressed, Hopeless 0 0 0 0  PHQ - 2 Score 0 0 0 0     Fall Risk:  Fall Risk  11/03/2015 09/08/2015 05/30/2015 10/30/2014  Falls in the past year? No No Yes Yes  Number falls in past yr: - - 1 1  Injury with Fall? - - Yes No  Risk for fall due to : - - - Other (Comment)  Follow up - - - Falls prevention discussed     Functional Status Survey: Is the patient deaf or have difficulty hearing?: No Does the patient have difficulty seeing, even when wearing glasses/contacts?: No Does the patient have difficulty concentrating, remembering, or making decisions?: No Does the patient have difficulty walking or climbing stairs?: No Does the patient have difficulty dressing or bathing?: No Does the patient have difficulty doing errands alone such as visiting a doctor's office or shopping?: No    Assessment & Plan  1. Medicare annual wellness visit, subsequent  Discussed importance of 150 minutes of physical activity weekly, eat two servings of fish weekly, eat one serving of tree nuts ( cashews,  pistachios, pecans, almonds.Marland Kitchen) every other day, eat 6 servings of fruit/vegetables daily and drink plenty of water and avoid sweet beverages.  -Hearing test: failed, refer to ENT   2. Benign hypertension  - telmisartan-hydrochlorothiazide (MICARDIS HCT) 80-25 MG tablet; Take 1 tablet by mouth daily.  Dispense: 90 tablet; Refill: 1 - Comprehensive metabolic panel  3. Diabetes mellitus with glaucoma, due to underlying condition, with cataract  - glucose blood (ONE TOUCH ULTRA TEST) test strip; Use as instructed  Dispense: 100 each; Refill: 12 - metFORMIN (GLUCOPHAGE) 1000 MG tablet; Take 1 tablet (1,000 mg total) by mouth daily with breakfast.  Dispense: 90 tablet; Refill: 1 - Hemoglobin A1c - POCT UA - Microalbumin   4. Dyslipidemia  - atorvastatin (LIPITOR) 40 MG tablet; Take 1 tablet (40 mg total) by mouth daily at 6 PM.  Dispense: 90 tablet; Refill: 1 - Lipid panel  5. Ovarian failure  - DG Bone Density; Future   6. Urge incontinence of urine  Mild symptoms, she does not want referral to PT or medication at this time, Kegel exercises -relaxation techniques   7. Hearing loss, bilateral  - Ambulatory referral to ENT

## 2015-11-05 LAB — LIPID PANEL
Chol/HDL Ratio: 2.9 ratio units (ref 0.0–4.4)
Cholesterol, Total: 154 mg/dL (ref 100–199)
HDL: 53 mg/dL (ref >39)
LDL Calculated: 65 mg/dL (ref 0–99)
Triglycerides: 181 mg/dL — ABNORMAL HIGH (ref 0–149)
VLDL Cholesterol Cal: 36 mg/dL (ref 5–40)

## 2015-11-05 LAB — COMPREHENSIVE METABOLIC PANEL
ALT: 18 IU/L (ref 0–32)
AST: 17 IU/L (ref 0–40)
Albumin/Globulin Ratio: 1.5 (ref 1.2–2.2)
Albumin: 4.2 g/dL (ref 3.5–4.8)
Alkaline Phosphatase: 79 IU/L (ref 39–117)
BUN/Creatinine Ratio: 19 (ref 12–28)
BUN: 15 mg/dL (ref 8–27)
Bilirubin Total: 0.7 mg/dL (ref 0.0–1.2)
CALCIUM: 9 mg/dL (ref 8.7–10.3)
CO2: 26 mmol/L (ref 18–29)
Chloride: 99 mmol/L (ref 96–106)
Creatinine, Ser: 0.8 mg/dL (ref 0.57–1.00)
GFR calc Af Amer: 85 mL/min/{1.73_m2} (ref >59)
GFR calc non Af Amer: 74 mL/min/{1.73_m2} (ref >59)
GLUCOSE: 156 mg/dL — AB (ref 65–99)
Globulin, Total: 2.8 g/dL (ref 1.5–4.5)
POTASSIUM: 4 mmol/L (ref 3.5–5.2)
Sodium: 143 mmol/L (ref 134–144)
Total Protein: 7 g/dL (ref 6.0–8.5)

## 2015-11-05 LAB — HEMOGLOBIN A1C
ESTIMATED AVERAGE GLUCOSE: 163 mg/dL
HEMOGLOBIN A1C: 7.3 % — AB (ref 4.8–5.6)

## 2015-11-19 ENCOUNTER — Encounter: Payer: Self-pay | Admitting: Family Medicine

## 2015-11-19 DIAGNOSIS — H903 Sensorineural hearing loss, bilateral: Secondary | ICD-10-CM | POA: Insufficient documentation

## 2016-03-10 ENCOUNTER — Ambulatory Visit (INDEPENDENT_AMBULATORY_CARE_PROVIDER_SITE_OTHER): Payer: Medicare Other

## 2016-03-10 DIAGNOSIS — Z23 Encounter for immunization: Secondary | ICD-10-CM | POA: Diagnosis not present

## 2016-05-04 ENCOUNTER — Ambulatory Visit (INDEPENDENT_AMBULATORY_CARE_PROVIDER_SITE_OTHER): Payer: Medicare Other | Admitting: Family Medicine

## 2016-05-04 ENCOUNTER — Encounter: Payer: Self-pay | Admitting: Family Medicine

## 2016-05-04 VITALS — BP 118/68 | HR 80 | Temp 98.1°F | Resp 16 | Ht 69.0 in | Wt 198.4 lb

## 2016-05-04 DIAGNOSIS — I10 Essential (primary) hypertension: Secondary | ICD-10-CM

## 2016-05-04 DIAGNOSIS — E118 Type 2 diabetes mellitus with unspecified complications: Secondary | ICD-10-CM

## 2016-05-04 DIAGNOSIS — N3941 Urge incontinence: Secondary | ICD-10-CM | POA: Diagnosis not present

## 2016-05-04 DIAGNOSIS — E785 Hyperlipidemia, unspecified: Secondary | ICD-10-CM

## 2016-05-04 DIAGNOSIS — Z9181 History of falling: Secondary | ICD-10-CM

## 2016-05-04 LAB — POCT GLYCOSYLATED HEMOGLOBIN (HGB A1C): HEMOGLOBIN A1C: 7.3

## 2016-05-04 MED ORDER — TELMISARTAN-HCTZ 80-12.5 MG PO TABS: 1.0000 | ORAL_TABLET | Freq: Every day | ORAL | 1 refills | Status: DC

## 2016-05-04 MED ORDER — ATORVASTATIN CALCIUM 40 MG PO TABS: 40.0000 mg | ORAL_TABLET | Freq: Every day | ORAL | 1 refills | Status: DC

## 2016-05-04 MED ORDER — METFORMIN HCL 1000 MG PO TABS: 1000.0000 mg | ORAL_TABLET | Freq: Every day | ORAL | 1 refills | Status: DC

## 2016-05-04 NOTE — Progress Notes (Signed)
Name: Adrienne Anderson   MRN: [id number]    DOB: [birth year]   Date:05/04/2016       Progress Note  Subjective  Chief Complaint  Chief Complaint  Patient presents with  . Diabetes    143 high 118 low   . Hyperlipidemia  . Hypertension    HPI  DMII : she takes Metformin daily, she denies side effects of medication, fsbs at home is usually at [facility], but only checks it fasting,  occasionally goes up, but highest was 143. She has not been as active lately, she has been compliant with her diet. She denies polyphagia, polydipsia or polyuria. Eye exam is up to date, she has glaucoma and cataract, but is stable. She is only on Xalatan now, off Timolol. Going to [facility] On 81 mg aspirin, statin therapy . She will try to walk after meals and be more compliant with her diet  HTN: denies side effects of medication, no chest pain, palpitation, or SOB, she is compliant with Telmisartan/HCTZ and bp has been towards low end of normal but no orthostatic changes  Hyperlipidemia: taking Atorvastatin, denies myalgia , we will recheck labs in June  Recent Fall: tripped on the curb while going to [employer], no loss of consciousness, she got up and kept walking.    Patient Active Problem List   Diagnosis Date Noted  . Sensorineural hearing loss of combined sites, bilateral 11/19/2015  . Glaucoma 08/24/2014  . Cataract 08/24/2014  . Diabetes ([place]) 08/24/2014  . Benign hypertension 08/24/2014  . Dyslipidemia 08/24/2014    Past Surgical History:  Procedure Laterality Date  . BREAST BIOPSY Bilateral    rt/clip-02/27/03, let - all neg  . BREAST LUMPECTOMY N/A 1982  . VAGINAL HYSTERECTOMY  1986    Family History  Problem Relation Age of Onset  . Depression Mother   . Rheum arthritis Father   . Depression Sister   . Hypertension Sister   . Breast cancer Sister   . Alcohol abuse Brother   . Hypertension Brother   . Hypertension Sister   . Cancer Sister     lung  . Hypertension  Sister   . Breast cancer Paternal Aunt   . Breast cancer Cousin   . Hypertension Sister   . Depression Sister     Social History   Social History  . Marital status: Married    Spouse name: N/A  . Number of children: 2  . Years of education: 16   Occupational History  . Retired    Social History Main Topics  . Smoking status: Never Smoker  . Smokeless tobacco: Never Used  . Alcohol use No  . Drug use: No  . Sexual activity: Not Currently   Other Topics Concern  . Not on file   Social History Narrative  . No narrative on file     Current Outpatient Prescriptions:  .  aspirin 81 MG tablet, Take by mouth., Disp: , Rfl:  .  atorvastatin (LIPITOR) 40 MG tablet, Take 1 tablet (40 mg total) by mouth daily at 6 PM., Disp: 90 tablet, Rfl: 1 .  glucose blood (ONE TOUCH ULTRA TEST) test strip, Use as instructed, Disp: 100 each, Rfl: 12 .  latanoprost (XALATAN) 0.005 % ophthalmic solution, Apply to eye., Disp: , Rfl:  .  metFORMIN (GLUCOPHAGE) 1000 MG tablet, Take 1 tablet (1,000 mg total) by mouth daily with breakfast., Disp: 90 tablet, Rfl: 1 .  MULTIPLE VITAMIN PO, Take by mouth., Disp: ,  Rfl:  .  telmisartan-hydrochlorothiazide (MICARDIS HCT) 80-12.5 MG tablet, Take 1 tablet by mouth daily., Disp: 90 tablet, Rfl: 1  No Known Allergies   ROS  Constitutional: Negative for fever or weight change.  Respiratory: Negative for cough and shortness of breath.   Cardiovascular: Negative for chest pain or palpitations.  Gastrointestinal: Negative for abdominal pain, no bowel changes.  Musculoskeletal: Negative for gait problem or joint swelling.  Skin: Negative for rash.  Neurological: Negative for dizziness or headache.  No other specific complaints in a complete review of systems (except as listed in HPI above).  Objective  Vitals:   05/04/16 0804  BP: 118/68  Pulse: 80  Resp: 16  Temp: 98.1 F (36.7 C)  TempSrc: Oral  SpO2: 95%  Weight: 198 lb 6 oz (90 kg)  Height:  5\' 9"  (1.753 m)    Body mass index is 29.29 kg/m.  Physical Exam  Constitutional: Patient appears well-developed and well-nourished. Obese  No distress.  HEENT: head atraumatic, normocephalic, pupils equal and reactive to light, neck supple, throat within normal limits Cardiovascular: Normal rate, regular rhythm and normal heart sounds.  No murmur heard. No BLE edema. Pulmonary/Chest: Effort normal and breath sounds normal. No respiratory distress. Abdominal: Soft.  There is no tenderness. Psychiatric: Patient has a normal mood and affect. behavior is normal. Judgment and thought content normal. Muscular Skeletal: healing scab on right knee, normal rom of both knees  Recent Results (from the past 2160 hour(s))  POCT HgB A1C     Status: None   Collection Time: 05/04/16  8:16 AM  Result Value Ref Range   Hemoglobin A1C 7.3      PHQ2/9: Depression screen [facility] 2/9 05/04/2016 11/03/2015 09/08/2015 05/30/2015 10/30/2014  Decreased Interest 0 0 0 0 0  Down, Depressed, Hopeless 0 0 0 0 0  PHQ - 2 Score 0 0 0 0 0    Fall Risk: Fall Risk  05/04/2016 11/03/2015 09/08/2015 05/30/2015 10/30/2014  Falls in the past year? Yes No No Yes Yes  Number falls in past yr: 1 - - 1 1  Injury with Fall? No - - Yes No  Risk for fall due to : History of fall(s) - - - Other (Comment)  Follow up Falls evaluation completed - - - Falls prevention discussed   She tripped on the curb while going in [facility], scrapped her knee but was able to get up and keep moving.   Functional Status Survey: Is the patient deaf or have difficulty hearing?: No Does the patient have difficulty seeing, even when wearing glasses/contacts?: No Does the patient have difficulty concentrating, remembering, or making decisions?: No Does the patient have difficulty walking or climbing stairs?: No Does the patient have difficulty dressing or bathing?: No Does the patient have difficulty doing errands alone such as visiting a doctor's office  or shopping?: No   Assessment & Plan  1. Type 2 diabetes mellitus with complication, without long-term current use of insulin (HCC)  - POCT HgB A1C - metFORMIN (GLUCOPHAGE) 1000 MG tablet; Take 1 tablet (1,000 mg total) by mouth daily with breakfast.  Dispense: 90 tablet; Refill: 1 HgbA1C is stable, however discussed adding another medication or increasing dose of Metformin, but she prefers to follow a more strict diet  2. Benign hypertension  bp is towards low end of normal, we will decreased from 25 of HCTZ to 12.5 mg - telmisartan-hydrochlorothiazide (MICARDIS HCT) 80-12.5 MG tablet; Take 1 tablet by mouth daily.  Dispense: 90  tablet; Refill: 1  3. Dyslipidemia  - atorvastatin (LIPITOR) 40 MG tablet; Take 1 tablet (40 mg total) by mouth daily at 6 PM.  Dispense: 90 tablet; Refill: 1  4. Urge incontinence of urine  Stable    5. History of recent fall  Doing well, discussed home safety, resume walking to help with strength

## 2016-06-29 ENCOUNTER — Ambulatory Visit
Admission: EM | Admit: 2016-06-29 | Discharge: 2016-06-29 | Disposition: A | Discharge status: Home / Self Care | Payer: Medicare Other | Attending: Family Medicine | Admitting: Family Medicine

## 2016-06-29 DIAGNOSIS — J069 Acute upper respiratory infection, unspecified: Secondary | ICD-10-CM

## 2016-06-29 MED ORDER — AZITHROMYCIN 250 MG PO TABS
250.0000 mg | ORAL_TABLET | Freq: Every day | ORAL | 0 refills | Status: DC
Start: 2016-06-29 — End: 2016-11-02

## 2016-06-29 MED ORDER — HYDROCOD POLST-CPM POLST ER 10-8 MG/5ML PO SUER: 5.0000 mL | Freq: Two times a day (BID) | ORAL | 0 refills | Status: DC

## 2016-06-29 NOTE — ED Provider Notes (Signed)
CSN: [id number]     Arrival date & time 06/29/16  0844 History   First MD Initiated Contact with Patient 06/29/16 [phone]     Chief Complaint  Patient presents with  . Cough   (Consider location/radiation/quality/duration/timing/severity/associated sxs/prior Treatment) HPI  This 76 year old female who presents with with that chest pain congestion chills and body aches. Her symptoms started approximate 5 days ago. They seem to worsen 2 days after onset. He denies to be bothered mostly with coughing diaphragmatic pain that she has been having when she coughs violently. The whitish nature. She had her flu shot in October. Temperature is 99.6 pulse rate of 73 blood pressure 141/82 respirations 18 O2 sats on room air is 95%.       Past Medical History:  Diagnosis Date  . Cataract   . Diabetes mellitus without complication ([place])   . Glaucoma   . Hyperlipidemia   . Hypertension   . Obesity    Past Surgical History:  Procedure Laterality Date  . BREAST BIOPSY Bilateral    rt/clip-02/27/03, let - all neg  . BREAST LUMPECTOMY N/A 1982  . VAGINAL HYSTERECTOMY  1986   Family History  Problem Relation Age of Onset  . Depression Mother   . Rheum arthritis Father   . Depression Sister   . Hypertension Sister   . Breast cancer Sister   . Alcohol abuse Brother   . Hypertension Brother   . Hypertension Sister   . Cancer Sister     lung  . Hypertension Sister   . Breast cancer Cousin   . Hypertension Sister   . Depression Sister   . Breast cancer Paternal Aunt    Social History  Substance Use Topics  . Smoking status: Never Smoker  . Smokeless tobacco: Never Used  . Alcohol use No   OB History    No data available     Review of Systems  Constitutional: Positive for activity change and chills.  HENT: Positive for congestion, postnasal drip and sore throat.   Respiratory: Positive for cough. Negative for shortness of breath, wheezing and stridor.   All other systems reviewed  and are negative.   Allergies  Patient has no known allergies.  Home Medications   Prior to Admission medications   Medication Sig Start Date End Date Taking? Authorizing Provider  aspirin 81 MG tablet Take by mouth.   Yes Historical Provider, MD  atorvastatin (LIPITOR) 40 MG tablet Take 1 tablet (40 mg total) by mouth daily at 6 PM. 05/04/16  Yes Steele Sizer, MD  glucose blood (ONE TOUCH ULTRA TEST) test strip Use as instructed 11/03/15  Yes Steele Sizer, MD  latanoprost (XALATAN) 0.005 % ophthalmic solution Apply to eye.   Yes Historical Provider, MD  metFORMIN (GLUCOPHAGE) 1000 MG tablet Take 1 tablet (1,000 mg total) by mouth daily with breakfast. 05/04/16  Yes Steele Sizer, MD  MULTIPLE VITAMIN PO Take by mouth.   Yes Historical Provider, MD  telmisartan-hydrochlorothiazide (MICARDIS HCT) 80-12.5 MG tablet Take 1 tablet by mouth daily. 05/04/16  Yes Steele Sizer, MD  azithromycin (ZITHROMAX) 250 MG tablet Take 1 tablet (250 mg total) by mouth daily. Take first 2 tablets together, then 1 every day until finished. 06/29/16   Lorin Picket, PA-C  chlorpheniramine-HYDROcodone (TUSSIONEX PENNKINETIC ER) 10-8 MG/5ML SUER Take 5 mLs by mouth 2 (two) times daily. 06/29/16   Lorin Picket, PA-C   Meds Ordered and Administered this Visit  Medications - No data to display  BP (!) 141/82 (BP Location: Left Arm)   Pulse 73   Temp 99.6 F (37.6 C) (Oral)   Resp 18   Ht 5\' 10"  (1.778 m)   Wt 196 lb (88.9 kg)   SpO2 95%   BMI 28.12 kg/m  No data found.   Physical Exam  Constitutional: She is oriented to person, place, and time. She appears well-developed and well-nourished. No distress.  HENT:  Head: Normocephalic and atraumatic.  Right Ear: External ear normal.  Left Ear: External ear normal.  Nose: Nose normal.  Mouth/Throat: Oropharynx is clear and moist. No oropharyngeal exudate.  Eyes: EOM are normal. Pupils are equal, round, and reactive to light. Right eye exhibits  no discharge. Left eye exhibits no discharge.  Neck: Normal range of motion. Neck supple.  Pulmonary/Chest: Effort normal and breath sounds normal. No respiratory distress. She has no wheezes. She has no rales.  Musculoskeletal: Normal range of motion.  Lymphadenopathy:    She has no cervical adenopathy.  Neurological: She is alert and oriented to person, place, and time.  Skin: Skin is warm and dry. She is not diaphoretic.  Psychiatric: She has a normal mood and affect. Her behavior is normal. Judgment and thought content normal.  Nursing note and vitals reviewed.   Urgent Care Course     Procedures (including critical care time)  Labs Review Labs Reviewed - No data to display  Imaging Review No results found.   Visual Acuity Review  Right Eye Distance:   Left Eye Distance:   Bilateral Distance:    Right Eye Near:   Left Eye Near:    Bilateral Near:         MDM   1. Acute upper respiratory infection    Discharge Medication List as of 06/29/2016  9:35 AM    START taking these medications   Details  azithromycin (ZITHROMAX) 250 MG tablet Take 1 tablet (250 mg total) by mouth daily. Take first 2 tablets together, then 1 every day until finished., Starting Tue 06/29/2016, Normal    chlorpheniramine-HYDROcodone (TUSSIONEX PENNKINETIC ER) 10-8 MG/5ML SUER Take 5 mLs by mouth 2 (two) times daily., Starting Tue 06/29/2016, Print      Plan: 1. Test/x-ray results and diagnosis reviewed with patient 2. rx as per orders; risks, benefits, potential side effects reviewed with patient 3. Recommend supportive treatment with Rest and fluids Tylenol or Motrin for body aches and fever. Patient may have some Tussionex left over from her previous bout and I've asked her to see if she still has some utilize that and destroy  the current prescription.Reminded  Her again to only take half a teaspoon  at bedtime. If she is not improving she will follow-up with her primary care physician 4.  F/u prn if symptoms worsen or don't improve     Lorin Picket, PA-C 06/29/16 [phone]

## 2016-06-29 NOTE — ED Triage Notes (Signed)
Patient c/o flu like symptoms- cough, congestion, chills, body aches. Patient states that symptoms started Thursday and worsened on Saturday.

## 2016-07-01 ENCOUNTER — Telehealth: Payer: Self-pay

## 2016-07-01 NOTE — Telephone Encounter (Signed)
Courtesy call back completed today after patient's visit at [facility]. Patient improved and will call back with any questions or concerns.  

## 2016-09-14 ENCOUNTER — Other Ambulatory Visit: Payer: Self-pay | Admitting: Family Medicine

## 2016-09-14 DIAGNOSIS — Z1231 Encounter for screening mammogram for malignant neoplasm of breast: Secondary | ICD-10-CM

## 2016-09-14 LAB — HM DIABETES EYE EXAM

## 2016-09-15 ENCOUNTER — Encounter: Payer: Self-pay | Admitting: Family Medicine

## 2016-10-28 ENCOUNTER — Ambulatory Visit
Admission: RE | Admit: 2016-10-28 | Discharge: 2016-10-28 | Disposition: A | Discharge status: Home / Self Care | Payer: Medicare Other | Source: Ambulatory Visit | Attending: Family Medicine | Admitting: Family Medicine

## 2016-10-28 ENCOUNTER — Other Ambulatory Visit: Payer: Self-pay | Admitting: Family Medicine

## 2016-10-28 DIAGNOSIS — Z1231 Encounter for screening mammogram for malignant neoplasm of breast: Secondary | ICD-10-CM

## 2016-10-28 DIAGNOSIS — I10 Essential (primary) hypertension: Secondary | ICD-10-CM

## 2016-10-28 NOTE — Telephone Encounter (Signed)
Sending 30 day supply due to pt having follow up appt on 11/02/2016 and medication adjustments may need to be made depending on PT's BP at that time. Please notify pt.

## 2016-10-28 NOTE — Progress Notes (Signed)
Negative mammogram, follow up in 1 year. Please call patient. Thank you!

## 2016-11-02 ENCOUNTER — Ambulatory Visit (INDEPENDENT_AMBULATORY_CARE_PROVIDER_SITE_OTHER): Payer: Medicare Other | Admitting: Family Medicine

## 2016-11-02 ENCOUNTER — Encounter: Payer: Self-pay | Admitting: Family Medicine

## 2016-11-02 VITALS — BP 118/74 | HR 90 | Temp 97.8°F | Resp 16 | Ht 70.0 in | Wt 195.4 lb

## 2016-11-02 DIAGNOSIS — E785 Hyperlipidemia, unspecified: Secondary | ICD-10-CM

## 2016-11-02 DIAGNOSIS — E118 Type 2 diabetes mellitus with unspecified complications: Secondary | ICD-10-CM | POA: Diagnosis not present

## 2016-11-02 DIAGNOSIS — I10 Essential (primary) hypertension: Secondary | ICD-10-CM

## 2016-11-02 LAB — POCT UA - MICROALBUMIN: Microalbumin Ur, POC: 20 mg/L

## 2016-11-02 LAB — POCT GLYCOSYLATED HEMOGLOBIN (HGB A1C): Hemoglobin A1C: 6.8

## 2016-11-02 MED ORDER — METFORMIN HCL 1000 MG PO TABS: 1000.0000 mg | ORAL_TABLET | Freq: Every day | ORAL | 1 refills | Status: DC

## 2016-11-02 MED ORDER — ATORVASTATIN CALCIUM 40 MG PO TABS: 40.0000 mg | ORAL_TABLET | Freq: Every day | ORAL | 1 refills | Status: DC

## 2016-11-02 MED ORDER — GLUCOSE BLOOD VI STRP
ORAL_STRIP | 12 refills | Status: AC
Start: 2016-11-02 — End: ?

## 2016-11-02 MED ORDER — TELMISARTAN-HCTZ 80-12.5 MG PO TABS: 1.0000 | ORAL_TABLET | Freq: Every day | ORAL | 1 refills | Status: DC

## 2016-11-02 NOTE — Progress Notes (Signed)
Name: Adrienne Anderson   MRN: [id number]    DOB: July 31, 1940   Date:11/02/2016       Progress Note  Subjective  Chief Complaint  Chief Complaint  Patient presents with  . Medication Refill    6 month F/U  . Diabetes    Checks couple of times a week, Average-140  . Hyperlipidemia    Cramps occasionally  . Hypertension    Edema in ankles during the day, goes down at night    HPI  DMII : she takes Metformin daily, she denies side effects of medication, fsbs at home -  highest was 180, lowest 124, average is 140's. A1C today is 6.8%, last A1C in 04/2017 was 7.3%  She has been more active lately renovating a rental property. She has been compliant with her diet - doesn't eat a formal diabetic diet, but eats less than she used to and has reduced desserts. She denies polyphagia, polydipsia or polyuria. Eye exam is up to date, she has glaucoma and cataract, but is still stable. She is only on Xalatan now. Going to [facility]. On 81 mg aspirin, statin therapy, on an ARB . She has been walking sometimes after meals and be more compliant with her diet.  HTN: At goal today, denies side effects of medication, no chest pain, palpitation, or SOB, she is compliant with Telmisartan/HCTZ and bp has been towards low end of normal but no orthostatic changes. Notes some dependent edema that goes away with elevation at night. We will recheck fasting labs on 11/09/16 at her physical.  Hyperlipidemia: taking Atorvastatin, denies myalgia , we will recheck fasting labs on 11/09/16 at her physical.  Patient Active Problem List   Diagnosis Date Noted  . Sensorineural hearing loss of combined sites, bilateral 11/19/2015  . Glaucoma 08/24/2014  . Cataract 08/24/2014  . Diabetes ([place]) 08/24/2014  . Benign hypertension 08/24/2014  . Dyslipidemia 08/24/2014    Past Surgical History:  Procedure Laterality Date  . BREAST BIOPSY Bilateral    rt/clip-02/27/03, let - all neg  . BREAST LUMPECTOMY N/A 1982   . VAGINAL HYSTERECTOMY  1986    Family History  Problem Relation Age of Onset  . Depression Mother   . Rheum arthritis Father   . Depression Sister   . Hypertension Sister   . Breast cancer Sister   . Alcohol abuse Brother   . Hypertension Brother   . Hypertension Sister   . Cancer Sister        lung  . Hypertension Sister   . Breast cancer Cousin   . Hypertension Sister   . Depression Sister   . Breast cancer Paternal Aunt     Social History   Social History  . Marital status: Married    Spouse name: N/A  . Number of children: 2  . Years of education: 16   Occupational History  . Retired    Social History Main Topics  . Smoking status: Never Smoker  . Smokeless tobacco: Never Used  . Alcohol use No  . Drug use: No  . Sexual activity: Not Currently   Other Topics Concern  . Not on file   Social History Narrative  . No narrative on file     Current Outpatient Prescriptions:  .  aspirin 81 MG tablet, Take by mouth., Disp: , Rfl:  .  atorvastatin (LIPITOR) 40 MG tablet, Take 1 tablet (40 mg total) by mouth daily at 6 PM., Disp: 90 tablet, Rfl: 1 .  glucose blood (ONE TOUCH ULTRA TEST) test strip, Use as instructed, Disp: 100 each, Rfl: 12 .  latanoprost (XALATAN) 0.005 % ophthalmic solution, Apply to eye., Disp: , Rfl:  .  metFORMIN (GLUCOPHAGE) 1000 MG tablet, Take 1 tablet (1,000 mg total) by mouth daily with breakfast., Disp: 90 tablet, Rfl: 1 .  MULTIPLE VITAMIN PO, Take by mouth., Disp: , Rfl:  .  telmisartan-hydrochlorothiazide (MICARDIS HCT) 80-12.5 MG tablet, TAKE 1 TABLET BY MOUTH DAILY., Disp: 30 tablet, Rfl: 0  No Known Allergies   ROS  Constitutional: Negative for fever or weight change.  Respiratory: Negative for cough and shortness of breath.   Cardiovascular: Negative for chest pain or palpitations.  Gastrointestinal: Negative for abdominal pain, no bowel changes.  Musculoskeletal: Negative for gait problem or joint swelling.  Skin:  Negative for rash.  Neurological: Negative for dizziness or headache.  No other specific complaints in a complete review of systems (except as listed in HPI above).  Objective  Vitals:   11/02/16 0827  BP: 118/74  Pulse: 90  Resp: 16  Temp: 97.8 F (36.6 C)  TempSrc: Oral  SpO2: 96%  Weight: 195 lb 6.4 oz (88.6 kg)  Height: 5\' 10"  (1.778 m)    Body mass index is 28.04 kg/m.  Physical Exam Constitutional: Patient appears well-developed and well-nourished. No distress.  HENT: Head: Normocephalic and atraumatic. Eyes: Conjunctivae and EOM are normal.  Neck: Normal range of motion. Neck supple. No JVD present. No thyromegaly present.  Cardiovascular: Normal rate, regular rhythm and normal heart sounds.  No murmur heard. No BLE edema. Pulmonary/Chest: Effort normal and breath sounds normal. No respiratory distress. Musculoskeletal: Normal range of motion, no joint effusions. No gross deformities Neurological: he is alert and oriented to person, place, and time. No cranial nerve deficit. Coordination, balance, strength, speech and gait are normal.  Skin: Skin is warm and dry. No rash noted. No erythema.  Psychiatric: Patient has a normal mood and affect. behavior is normal. Judgment and thought content normal.  Recent Results (from the past 2160 hour(s))  HM DIABETES EYE EXAM     Status: None   Collection Time: 09/14/16 12:00 AM  Result Value Ref Range   HM Diabetic Eye Exam No Retinopathy No Retinopathy    Comment: Dr. Karren Burly, [place]  POCT HgB A1C     Status: None   Collection Time: 11/02/16  8:29 AM  Result Value Ref Range   Hemoglobin A1C 6.8   POCT UA - Microalbumin     Status: None   Collection Time: 11/02/16  8:29 AM  Result Value Ref Range   Microalbumin Ur, POC 20 mg/L   Creatinine, POC  mg/dL   Albumin/Creatinine Ratio, Urine, POC      Diabetic Foot Exam: Diabetic Foot Exam - Simple   Simple Foot Form Visual Inspection No deformities, no  ulcerations, no other skin breakdown bilaterally:  Yes Sensation Testing Intact to touch and monofilament testing bilaterally:  Yes Pulse Check Posterior Tibialis and Dorsalis pulse intact bilaterally:  Yes Comments    PHQ2/9: Depression screen [facility] 2/9 11/02/2016 05/04/2016 11/03/2015 09/08/2015 05/30/2015  Decreased Interest 0 0 0 0 0  Down, Depressed, Hopeless 0 0 0 0 0  PHQ - 2 Score 0 0 0 0 0    Fall Risk: Fall Risk  11/02/2016 05/04/2016 11/03/2015 09/08/2015 05/30/2015  Falls in the past year? No Yes No No Yes  Number falls in past yr: - 1 - - 1  Injury with  Fall? - No - - Yes  Risk for fall due to : - History of fall(s) - - -  Follow up - Falls evaluation completed - - -   Functional Status Survey: Is the patient deaf or have difficulty hearing?: No Does the patient have difficulty seeing, even when wearing glasses/contacts?: No Does the patient have difficulty concentrating, remembering, or making decisions?: No Does the patient have difficulty walking or climbing stairs?: No Does the patient have difficulty dressing or bathing?: No Does the patient have difficulty doing errands alone such as visiting a doctor's office or shopping?: No   Assessment & Plan  1. Type 2 diabetes mellitus with complication, without long-term current use of insulin (HCC)  - POCT HgB A1C - POCT UA - Microalbumin - metFORMIN (GLUCOPHAGE) 1000 MG tablet; Take 1 tablet (1,000 mg total) by mouth daily with breakfast.  Dispense: 90 tablet; Refill: 1 - glucose blood (ONE TOUCH ULTRA TEST) test strip; Use as instructed  Dispense: 100 each; Refill: 12 - We will recheck fasting labs on 11/09/16 at her physical. 2. Dyslipidemia  - atorvastatin (LIPITOR) 40 MG tablet; Take 1 tablet (40 mg total) by mouth daily at 6 PM.  Dispense: 90 tablet; Refill: 1 - We will recheck fasting labs on 11/09/16 at her physical.  3. Benign hypertension - telmisartan-hydrochlorothiazide (MICARDIS HCT) 80-12.5 MG tablet; Take 1  tablet by mouth daily.  Dispense: 90 tablet; Refill: 1  -Reviewed Health Maintenance: CPE w/ fasting labs on 11/09/2016. Eye exam and A1C and Foot exam updated today.  I saw patient with Raelyn Ensign  Steele Sizer, MD [facility] 11/02/2016, 9:03 AM

## 2016-11-02 NOTE — Patient Instructions (Signed)
Please come fasting to your appointment on June 19th so that we may draw labs.

## 2016-11-09 ENCOUNTER — Ambulatory Visit (INDEPENDENT_AMBULATORY_CARE_PROVIDER_SITE_OTHER): Payer: Medicare Other | Admitting: Family Medicine

## 2016-11-09 ENCOUNTER — Encounter: Payer: Self-pay | Admitting: Family Medicine

## 2016-11-09 VITALS — BP 124/74 | HR 75 | Temp 98.2°F | Resp 16 | Ht 70.0 in | Wt 195.2 lb

## 2016-11-09 DIAGNOSIS — N3941 Urge incontinence: Secondary | ICD-10-CM

## 2016-11-09 DIAGNOSIS — Z0000 Encounter for general adult medical examination without abnormal findings: Secondary | ICD-10-CM

## 2016-11-09 DIAGNOSIS — I10 Essential (primary) hypertension: Secondary | ICD-10-CM | POA: Diagnosis not present

## 2016-11-09 DIAGNOSIS — E785 Hyperlipidemia, unspecified: Secondary | ICD-10-CM

## 2016-11-09 LAB — COMPLETE METABOLIC PANEL WITH GFR
ALT: 19 U/L (ref 6–29)
AST: 17 U/L (ref 10–35)
Albumin: 4.1 g/dL (ref 3.6–5.1)
Alkaline Phosphatase: 79 U/L (ref 33–130)
BUN: 13 mg/dL (ref 7–25)
CHLORIDE: 106 mmol/L (ref 98–110)
CO2: 31 mmol/L (ref 20–31)
Calcium: 9.2 mg/dL (ref 8.6–10.4)
Creat: 0.7 mg/dL (ref 0.60–0.93)
GFR, Est African American: >89 mL/min (ref >=60)
GFR, Est Non African American: 86 mL/min (ref >=60)
Glucose, Bld: 132 mg/dL — ABNORMAL HIGH (ref 65–99)
POTASSIUM: 3.9 mmol/L (ref 3.5–5.3)
SODIUM: 141 mmol/L (ref 135–146)
Total Bilirubin: 0.6 mg/dL (ref 0.2–1.2)
Total Protein: 7.1 g/dL (ref 6.1–8.1)

## 2016-11-09 LAB — LIPID PANEL
CHOL/HDL RATIO: 2.9 ratio (ref <5.0)
Cholesterol: 148 mg/dL (ref <200)
HDL: 51 mg/dL (ref >50)
LDL Cholesterol: 72 mg/dL (ref <100)
Triglycerides: 124 mg/dL (ref <150)
VLDL: 25 mg/dL (ref <30)

## 2016-11-09 NOTE — Patient Instructions (Signed)
Preventive Care 65 Years and Older, Female Preventive care refers to lifestyle choices and visits with your health care provider that can promote health and wellness. What does preventive care include?  A yearly physical exam. This is also called an annual well check.  Dental exams once or twice a year.  Routine eye exams. Ask your health care provider how often you should have your eyes checked.  Personal lifestyle choices, including: ? Daily care of your teeth and gums. ? Regular physical activity. ? Eating a healthy diet. ? Avoiding tobacco and drug use. ? Limiting alcohol use. ? Practicing safe sex. ? Taking low-dose aspirin every day. ? Taking vitamin and mineral supplements as recommended by your health care provider. What happens during an annual well check? The services and screenings done by your health care provider during your annual well check will depend on your age, overall health, lifestyle risk factors, and family history of disease. Counseling Your health care provider may ask you questions about your:  Alcohol use.  Tobacco use.  Drug use.  Emotional well-being.  Home and relationship well-being.  Sexual activity.  Eating habits.  History of falls.  Memory and ability to understand (cognition).  Work and work environment.  Reproductive health.  Screening You may have the following tests or measurements:  Height, weight, and BMI.  Blood pressure.  Lipid and cholesterol levels. These may be checked every 5 years, or more frequently if you are over 50 years old.  Skin check.  Lung cancer screening. You may have this screening every year starting at age 55 if you have a 30-pack-year history of smoking and currently smoke or have quit within the past 15 years.  Fecal occult blood test (FOBT) of the stool. You may have this test every year starting at age 50.  Flexible sigmoidoscopy or colonoscopy. You may have a sigmoidoscopy every 5 years or  a colonoscopy every 10 years starting at age 50.  Hepatitis C blood test.  Hepatitis B blood test.  Sexually transmitted disease (STD) testing.  Diabetes screening. This is done by checking your blood sugar (glucose) after you have not eaten for a while (fasting). You may have this done every 1-3 years.  Bone density scan. This is done to screen for osteoporosis. You may have this done starting at age 74.  Mammogram. This may be done every 1-2 years. Talk to your health care provider about how often you should have regular mammograms.  Talk with your health care provider about your test results, treatment options, and if necessary, the need for more tests. Vaccines Your health care provider may recommend certain vaccines, such as:  Influenza vaccine. This is recommended every year.  Tetanus, diphtheria, and acellular pertussis (Tdap, Td) vaccine. You may need a Td booster every 10 years.  Varicella vaccine. You may need this if you have not been vaccinated.  Zoster vaccine. You may need this after age 60.  Measles, mumps, and rubella (MMR) vaccine. You may need at least one dose of MMR if you were born in 1957 or later. You may also need a second dose.  Pneumococcal 13-valent conjugate (PCV13) vaccine. One dose is recommended after age 74.  Pneumococcal polysaccharide (PPSV23) vaccine. One dose is recommended after age 74.  Meningococcal vaccine. You may need this if you have certain conditions.  Hepatitis A vaccine. You may need this if you have certain conditions or if you travel or work in places where you may be exposed to hepatitis   A.  Hepatitis B vaccine. You may need this if you have certain conditions or if you travel or work in places where you may be exposed to hepatitis B.  Haemophilus influenzae type b (Hib) vaccine. You may need this if you have certain conditions.  Talk to your health care provider about which screenings and vaccines you need and how often you  need them. This information is not intended to replace advice given to you by your health care provider. Make sure you discuss any questions you have with your health care provider. Document Released: 06/06/2015 Document Revised: 01/28/2016 Document Reviewed: 03/11/2015 Elsevier Interactive Patient Education  2017 [employer].

## 2016-11-09 NOTE — Progress Notes (Signed)
Name: Adrienne Anderson   MRN: [id number]    DOB: Oct 22, 1940   Date:11/09/2016       Progress Note  Subjective  Chief Complaint  Chief Complaint  Patient presents with  . Annual Exam    HPI  Functional ability/safety issues: No Issues Hearing issues: Addressed - she saw Dr. Tami Ribas last year, very mild sensorineural hearing loss Activities of daily living: Discussed Home safety issues: No Issues  End Of Life Planning: They have one already - advised to bring [place] a copy   Preventative care, Health maintenance, Preventative health measures discussed.  Preventative screenings discussed today: lab work, colonoscopy,  mammogram, DEXA.  Low Dose CT Chest recommended if Age 26-80 years, 30 pack-year currently smoking OR have quit w/in 15years.   Lifestyle risk factor issued reviewed: Diet, exercise, weight management, advised patient smoking is not healthy, nutrition/diet.  Preventative health measures discussed (5-10 year plan).  Reviewed and recommended vaccinations: - Pneumovax  - Prevnar  - Annual Influenza - Zostavax - Tdap   Depression screening: Done Fall risk screening: Done Discuss ADLs/IADLs: Done  Current medical providers: See HPI  Other health risk factors identified this visit: No other issues Cognitive impairment issues: None identified  All above discussed with patient. Appropriate education, counseling and referral will be made based upon the above.    Patient Active Problem List   Diagnosis Date Noted  . Sensorineural hearing loss of combined sites, bilateral 11/19/2015  . Glaucoma 08/24/2014  . Cataract 08/24/2014  . Diabetes ([place]) 08/24/2014  . Benign hypertension 08/24/2014  . Dyslipidemia 08/24/2014    Past Surgical History:  Procedure Laterality Date  . BREAST BIOPSY Bilateral    rt/clip-02/27/03, let - all neg  . BREAST LUMPECTOMY N/A 1982  . VAGINAL HYSTERECTOMY  1986    Family History  Problem Relation Age of Onset  . Depression  Mother   . Rheum arthritis Father   . Depression Sister   . Hypertension Sister   . Breast cancer Sister   . Alcohol abuse Brother   . Hypertension Brother   . Hypertension Sister   . Cancer Sister        lung  . Hypertension Sister   . Breast cancer Cousin   . Hypertension Sister   . Depression Sister   . Breast cancer Paternal Aunt     Social History   Social History  . Marital status: Married    Spouse name: N/A  . Number of children: 2  . Years of education: 16   Occupational History  . Retired    Social History Main Topics  . Smoking status: Never Smoker  . Smokeless tobacco: Never Used  . Alcohol use No  . Drug use: No  . Sexual activity: Not Currently   Other Topics Concern  . Not on file   Social History Narrative  . No narrative on file     Current Outpatient Prescriptions:  .  aspirin 81 MG tablet, Take by mouth., Disp: , Rfl:  .  atorvastatin (LIPITOR) 40 MG tablet, Take 1 tablet (40 mg total) by mouth daily at 6 PM., Disp: 90 tablet, Rfl: 1 .  glucose blood (ONE TOUCH ULTRA TEST) test strip, Use as instructed, Disp: 100 each, Rfl: 12 .  latanoprost (XALATAN) 0.005 % ophthalmic solution, Apply to eye., Disp: , Rfl:  .  metFORMIN (GLUCOPHAGE) 1000 MG tablet, Take 1 tablet (1,000 mg total) by mouth daily with breakfast., Disp: 90 tablet, Rfl: 1 .  MULTIPLE  VITAMIN PO, Take by mouth., Disp: , Rfl:  .  telmisartan-hydrochlorothiazide (MICARDIS HCT) 80-12.5 MG tablet, Take 1 tablet by mouth daily., Disp: 90 tablet, Rfl: 1  Allergies  Allergen Reactions  . Clindamycin/Lincomycin Nausea Only     ROS  Constitutional: Negative for fever or weight change.  Respiratory: Negative for cough and shortness of breath.   Cardiovascular: Negative for chest pain or palpitations.  Gastrointestinal: Negative for abdominal pain, no bowel changes.  Musculoskeletal: Negative for gait problem or joint swelling.  Skin: Negative for rash.  Neurological: Negative for  dizziness or headache.  No other specific complaints in a complete review of systems (except as listed in HPI above).  Objective  Vitals:   11/09/16 0838  BP: 124/74  Pulse: 75  Resp: 16  Temp: 98.2 F (36.8 C)  SpO2: 95%  Weight: 195 lb 3 oz (88.5 kg)  Height: 5\' 10"  (1.778 m)    Body mass index is 28.01 kg/m.  Physical Exam  Constitutional: Patient appears well-developed and overweight .  No distress.  HENT: Head: Normocephalic and atraumatic. Ears: B TMs ok, no erythema or effusion; Nose: Nose normal. Mouth/Throat: Oropharynx is clear and moist. No oropharyngeal exudate.  Eyes: Conjunctivae and EOM are normal. Pupils are equal, round, and reactive to light. No scleral icterus.  Neck: Normal range of motion. Neck supple. No JVD present. No thyromegaly present.  Cardiovascular: Normal rate, regular rhythm and normal heart sounds.  No murmur heard. No BLE edema. Pulmonary/Chest: Effort normal and breath sounds normal. No respiratory distress. Abdominal: Soft. Bowel sounds are normal, no distension. There is no tenderness. no masses Breast: no lumps or masses, no nipple discharge or rashes FEMALE GENITALIA:  External genitalia normal External urethra normal S/p hysterectomy ( pelvic not done)  RECTAL: not done Musculoskeletal: Normal range of motion, no joint effusions. No gross deformities Neurological: he is alert and oriented to person, place, and time. No cranial nerve deficit. Coordination, balance, strength, speech and gait are normal.  Skin: Skin is warm and dry. No rash noted. No erythema.  Psychiatric: Patient has a normal mood and affect. behavior is normal. Judgment and thought content normal.   Recent Results (from the past 2160 hour(s))  HM DIABETES EYE EXAM     Status: None   Collection Time: 09/14/16 12:00 AM  Result Value Ref Range   HM Diabetic Eye Exam No Retinopathy No Retinopathy    Comment: Dr. Karren Burly, [facility]  POCT HgB A1C      Status: None   Collection Time: 11/02/16  8:29 AM  Result Value Ref Range   Hemoglobin A1C 6.8   POCT UA - Microalbumin     Status: None   Collection Time: 11/02/16  8:29 AM  Result Value Ref Range   Microalbumin Ur, POC 20 mg/L   Creatinine, POC  mg/dL   Albumin/Creatinine Ratio, Urine, POC      Current Exercise Habits: The patient does not participate in regular exercise at present    PHQ2/9: Depression screen [facility] 2/9 11/09/2016 11/02/2016 05/04/2016 11/03/2015 09/08/2015  Decreased Interest 0 0 0 0 0  Down, Depressed, Hopeless 0 0 0 0 0  PHQ - 2 Score 0 0 0 0 0     Fall Risk: Fall Risk  11/09/2016 11/02/2016 05/04/2016 11/03/2015 09/08/2015  Falls in the past year? Yes No Yes No No  Number falls in past yr: 1 - 1 - -  Injury with Fall? No - No - -  Risk  for fall due to : - - History of fall(s) - -  Follow up Falls evaluation completed - Falls evaluation completed - -   She tripped and fell before Christmas, on the curb getting out of [facility], she skinned her knee, she got up on her own.    Functional Status Survey: Is the patient deaf or have difficulty hearing?: No Does the patient have difficulty seeing, even when wearing glasses/contacts?: No Does the patient have difficulty concentrating, remembering, or making decisions?: No Does the patient have difficulty walking or climbing stairs?: No Does the patient have difficulty dressing or bathing?: No Does the patient have difficulty doing errands alone such as visiting a doctor's office or shopping?: No    Assessment & Plan  1. Medicare annual wellness visit, subsequent  Discussed importance of 150 minutes of physical activity weekly, eat two servings of fish weekly, eat one serving of tree nuts ( cashews, pistachios, pecans, almonds.Marland Kitchen) every other day, eat 6 servings of fruit/vegetables daily and drink plenty of water and avoid sweet beverages.   2. Urge incontinence of urine  Stable, she does not want medication    3. Dyslipidemia  - Lipid panel  4. Benign hypertension  -EKG - sinus bradycardia - COMPLETE METABOLIC PANEL WITH GFR

## 2017-03-28 ENCOUNTER — Ambulatory Visit (INDEPENDENT_AMBULATORY_CARE_PROVIDER_SITE_OTHER): Payer: Medicare Other

## 2017-03-28 DIAGNOSIS — Z23 Encounter for immunization: Secondary | ICD-10-CM

## 2017-03-30 ENCOUNTER — Ambulatory Visit: Payer: Medicare Other

## 2017-05-03 ENCOUNTER — Other Ambulatory Visit: Payer: Self-pay | Admitting: Family Medicine

## 2017-05-03 DIAGNOSIS — I10 Essential (primary) hypertension: Secondary | ICD-10-CM

## 2017-05-12 ENCOUNTER — Encounter: Payer: Self-pay | Admitting: Family Medicine

## 2017-05-12 ENCOUNTER — Ambulatory Visit: Payer: Medicare Other | Admitting: Family Medicine

## 2017-05-12 VITALS — BP 126/72 | HR 80 | Temp 98.2°F | Resp 16 | Ht 70.0 in | Wt 198.8 lb

## 2017-05-12 DIAGNOSIS — E785 Hyperlipidemia, unspecified: Secondary | ICD-10-CM | POA: Diagnosis not present

## 2017-05-12 DIAGNOSIS — E118 Type 2 diabetes mellitus with unspecified complications: Secondary | ICD-10-CM | POA: Diagnosis not present

## 2017-05-12 DIAGNOSIS — I10 Essential (primary) hypertension: Secondary | ICD-10-CM | POA: Diagnosis not present

## 2017-05-12 DIAGNOSIS — E1169 Type 2 diabetes mellitus with other specified complication: Secondary | ICD-10-CM | POA: Diagnosis not present

## 2017-05-12 LAB — POCT GLYCOSYLATED HEMOGLOBIN (HGB A1C): Hemoglobin A1C: 7.5

## 2017-05-12 MED ORDER — METFORMIN HCL 1000 MG PO TABS: 1000.0000 mg | ORAL_TABLET | Freq: Every day | ORAL | 1 refills | Status: DC

## 2017-05-12 MED ORDER — ATORVASTATIN CALCIUM 40 MG PO TABS: 40.0000 mg | ORAL_TABLET | Freq: Every day | ORAL | 1 refills | Status: DC

## 2017-05-12 NOTE — Progress Notes (Signed)
Name: Adrienne Anderson   MRN: [id number]    DOB: Jan 17, 1941   Date:05/12/2017       Progress Note  Subjective  Chief Complaint  Chief Complaint  Patient presents with  . Medication Refill    6 month F/U  . Hypertension    Denies any symptoms  . Hyperlipidemia  . Diabetes    Checks pre-prandial    HPI  DMII : she takes Metformin daily, she denies side effects of medication, fsbs 140's ,  last A1C in 04/2016 was 7.3%, went down to  6.8% up again to 7.5%  She has not  been very compliant with her diet  She denies polyphagia, polydipsia or polyuria. Eye exam is up to date, she has glaucoma and cataract, but is still stable. She is only on Xalatan now. Going to [facility]. On 81 mg aspirin, statin therapy, on an ARB . She has not been walking lately, but will resume that too.   HTN: At goal today, denies side effects of medication, no chest pain, palpitation, or SOB, she is compliant with Telmisartan/HCTZ denies orthostatic changes. She states she feels great.   Hyperlipidemia: taking Atorvastatin, denies myalgia , reviewed labs done 10/2016  Patient Active Problem List   Diagnosis Date Noted  . Sensorineural hearing loss of combined sites, bilateral 11/19/2015  . Glaucoma 08/24/2014  . Cataract 08/24/2014  . Diabetes ([place]) 08/24/2014  . Benign hypertension 08/24/2014  . Dyslipidemia 08/24/2014    Past Surgical History:  Procedure Laterality Date  . BREAST BIOPSY Bilateral    rt/clip-02/27/03, let - all neg  . BREAST LUMPECTOMY N/A 1982  . VAGINAL HYSTERECTOMY  1986    Family History  Problem Relation Age of Onset  . Depression Mother   . Rheum arthritis Father   . Depression Sister   . Hypertension Sister   . Breast cancer Sister   . Alcohol abuse Brother   . Hypertension Brother   . Hypertension Sister   . Cancer Sister        lung  . Hypertension Sister   . Breast cancer Cousin   . Hypertension Sister   . Depression Sister   . Breast cancer  Paternal Aunt     Social History   Socioeconomic History  . Marital status: Married    Spouse name: Not on file  . Number of children: 2  . Years of education: 53  . Highest education level: Not on file  Social Needs  . Financial resource strain: Not on file  . Food insecurity - worry: Not on file  . Food insecurity - inability: Not on file  . Transportation needs - medical: Not on file  . Transportation needs - non-medical: Not on file  Occupational History  . Occupation: Retired  Tobacco Use  . Smoking status: Never Smoker  . Smokeless tobacco: Never Used  Substance and Sexual Activity  . Alcohol use: No    Alcohol/week: 0.0 oz  . Drug use: No  . Sexual activity: Not Currently  Other Topics Concern  . Not on file  Social History Narrative  . Not on file     Current Outpatient Medications:  .  aspirin 81 MG tablet, Take by mouth., Disp: , Rfl:  .  atorvastatin (LIPITOR) 40 MG tablet, Take 1 tablet (40 mg total) by mouth daily at 6 PM., Disp: 90 tablet, Rfl: 1 .  glucose blood (ONE TOUCH ULTRA TEST) test strip, Use as instructed, Disp: 100 each, Rfl: 12 .  latanoprost (XALATAN) 0.005 % ophthalmic solution, Apply to eye., Disp: , Rfl:  .  metFORMIN (GLUCOPHAGE) 1000 MG tablet, Take 1 tablet (1,000 mg total) by mouth daily with breakfast., Disp: 90 tablet, Rfl: 1 .  MULTIPLE VITAMIN PO, Take by mouth., Disp: , Rfl:  .  telmisartan-hydrochlorothiazide (MICARDIS HCT) 80-12.5 MG tablet, TAKE 1 TABLET BY MOUTH EVERY DAY, Disp: 90 tablet, Rfl: 1  Allergies  Allergen Reactions  . Clindamycin/Lincomycin Nausea Only     ROS  Constitutional: Negative for fever or weight change.  Respiratory: Negative for cough and shortness of breath.   Cardiovascular: Negative for chest pain or palpitations.  Gastrointestinal: Negative for abdominal pain, no bowel changes.  Musculoskeletal: Negative for gait problem or joint swelling.  Skin: Negative for rash.  Neurological: Negative  for dizziness or headache.  No other specific complaints in a complete review of systems (except as listed in HPI above).  Objective  Vitals:   05/12/17 0850  BP: 126/72  Pulse: 80  Resp: 16  Temp: 98.2 F (36.8 C)  TempSrc: Oral  SpO2: 98%  Weight: 198 lb 12.8 oz (90.2 kg)  Height: 5\' 10"  (1.778 m)    Body mass index is 28.52 kg/m.  Physical Exam  Constitutional: Patient appears well-developed and well-nourished. Obese  No distress.  HEENT: head atraumatic, normocephalic, pupils equal and reactive to light, neck supple, throat within normal limits Cardiovascular: Normal rate, regular rhythm and normal heart sounds.  No murmur heard. No BLE edema. Pulmonary/Chest: Effort normal and breath sounds normal. No respiratory distress. Abdominal: Soft.  There is no tenderness. Psychiatric: Patient has a normal mood and affect. behavior is normal. Judgment and thought content normal.  Recent Results (from the past 2160 hour(s))  POCT HgB A1C     Status: Abnormal   Collection Time: 05/12/17  8:55 AM  Result Value Ref Range   Hemoglobin A1C 7.5      PHQ2/9: Depression screen [facility] 2/9 05/12/2017 11/09/2016 11/02/2016 05/04/2016 11/03/2015  Decreased Interest 0 0 0 0 0  Down, Depressed, Hopeless 0 0 0 0 0  PHQ - 2 Score 0 0 0 0 0    Fall Risk: Fall Risk  05/12/2017 11/09/2016 11/02/2016 05/04/2016 11/03/2015  Falls in the past year? No Yes No Yes No  Comment - - - - -  Number falls in past yr: - 1 - 1 -  Injury with Fall? - No - No -  Comment - - - - -  Risk for fall due to : - - - History of fall(s) -  Follow up - Falls evaluation completed - Falls evaluation completed -      Functional Status Survey: Is the patient deaf or have difficulty hearing?: No Does the patient have difficulty seeing, even when wearing glasses/contacts?: No Does the patient have difficulty concentrating, remembering, or making decisions?: No Does the patient have difficulty walking or climbing  stairs?: No Does the patient have difficulty dressing or bathing?: No Does the patient have difficulty doing errands alone such as visiting a doctor's office or shopping?: No    Assessment & Plan  1. Dyslipidemia associated with type 2 diabetes mellitus (HCC)  - POCT HgB A1C -7.5% - metFORMIN (GLUCOPHAGE) 1000 MG tablet; Take 1 tablet (1,000 mg total) by mouth daily with breakfast.  Dispense: 90 tablet; Refill: 1 Discussed going up on Metformin, however she prefers being more strict with her diet and recheck in 4 months   2. Dyslipidemia  - atorvastatin (LIPITOR) 40  MG tablet; Take 1 tablet (40 mg total) by mouth daily at 6 PM.  Dispense: 90 tablet; Refill: 1  3. Benign hypertension  At goal, continue medication

## 2017-08-24 ENCOUNTER — Encounter: Payer: Self-pay | Admitting: Family Medicine

## 2017-08-24 ENCOUNTER — Ambulatory Visit: Payer: Medicare Other | Admitting: Family Medicine

## 2017-08-24 ENCOUNTER — Ambulatory Visit
Admission: RE | Admit: 2017-08-24 | Discharge: 2017-08-24 | Disposition: A | Discharge status: Home / Self Care | Payer: Medicare Other | Source: Ambulatory Visit | Attending: Family Medicine | Admitting: Family Medicine

## 2017-08-24 VITALS — BP 140/90 | HR 64 | Resp 16 | Ht 70.0 in | Wt 202.9 lb

## 2017-08-24 DIAGNOSIS — N3941 Urge incontinence: Secondary | ICD-10-CM

## 2017-08-24 DIAGNOSIS — R059 Cough, unspecified: Secondary | ICD-10-CM

## 2017-08-24 DIAGNOSIS — E1169 Type 2 diabetes mellitus with other specified complication: Secondary | ICD-10-CM | POA: Diagnosis not present

## 2017-08-24 DIAGNOSIS — E785 Hyperlipidemia, unspecified: Secondary | ICD-10-CM

## 2017-08-24 DIAGNOSIS — R05 Cough: Secondary | ICD-10-CM | POA: Insufficient documentation

## 2017-08-24 DIAGNOSIS — R918 Other nonspecific abnormal finding of lung field: Secondary | ICD-10-CM | POA: Diagnosis not present

## 2017-08-24 DIAGNOSIS — I10 Essential (primary) hypertension: Secondary | ICD-10-CM | POA: Diagnosis not present

## 2017-08-24 LAB — CBC WITH DIFFERENTIAL/PLATELET
BASOS PCT: 0.7 %
Basophils Absolute: 21 cells/uL (ref 0–200)
EOS PCT: 2.3 %
Eosinophils Absolute: 69 cells/uL (ref 15–500)
HCT: 38.2 % (ref 35.0–45.0)
Hemoglobin: 13 g/dL (ref 11.7–15.5)
Lymphs Abs: 1461 cells/uL (ref 850–3900)
MCH: 30.7 pg (ref 27.0–33.0)
MCHC: 34 g/dL (ref 32.0–36.0)
MCV: 90.1 fL (ref 80.0–100.0)
MPV: 10.7 fL (ref 7.5–12.5)
Monocytes Relative: 13 %
Neutro Abs: 1059 cells/uL — ABNORMAL LOW (ref 1500–7800)
Neutrophils Relative %: 35.3 %
Platelets: 221 10*3/uL (ref 140–400)
RBC: 4.24 10*6/uL (ref 3.80–5.10)
RDW: 13.1 % (ref 11.0–15.0)
Total Lymphocyte: 48.7 %
WBC mixed population: 390 cells/uL (ref 200–950)
WBC: 3 10*3/uL — AB (ref 3.8–10.8)

## 2017-08-24 LAB — COMPLETE METABOLIC PANEL WITH GFR
AG RATIO: 1.2 (calc) (ref 1.0–2.5)
ALT: 20 U/L (ref 6–29)
AST: 16 U/L (ref 10–35)
Albumin: 4 g/dL (ref 3.6–5.1)
Alkaline phosphatase (APISO): 86 U/L (ref 33–130)
BUN: 14 mg/dL (ref 7–25)
CO2: 33 mmol/L — AB (ref 20–32)
CREATININE: 0.72 mg/dL (ref 0.60–0.93)
Calcium: 9.2 mg/dL (ref 8.6–10.4)
Chloride: 105 mmol/L (ref 98–110)
GFR, EST AFRICAN AMERICAN: 96 mL/min/{1.73_m2} (ref >=60)
GFR, Est Non African American: 82 mL/min/{1.73_m2} (ref >=60)
GLUCOSE: 163 mg/dL — AB (ref 65–99)
Globulin: 3.4 g/dL (calc) (ref 1.9–3.7)
POTASSIUM: 3.9 mmol/L (ref 3.5–5.3)
Sodium: 142 mmol/L (ref 135–146)
TOTAL PROTEIN: 7.4 g/dL (ref 6.1–8.1)
Total Bilirubin: 0.6 mg/dL (ref 0.2–1.2)

## 2017-08-24 LAB — LIPID PANEL
CHOLESTEROL: 177 mg/dL (ref <200)
HDL: 54 mg/dL (ref >50)
LDL Cholesterol (Calc): 95 mg/dL (calc)
NON-HDL CHOLESTEROL (CALC): 123 mg/dL (ref <130)
TRIGLYCERIDES: 184 mg/dL — AB (ref <150)
Total CHOL/HDL Ratio: 3.3 (calc) (ref <5.0)

## 2017-08-24 LAB — POCT GLYCOSYLATED HEMOGLOBIN (HGB A1C): HEMOGLOBIN A1C: 7.6

## 2017-08-24 MED ORDER — EMPAGLIFLOZIN-METFORMIN HCL ER 12.5-1000 MG PO TB24: 1.0000 | ORAL_TABLET | Freq: Every day | ORAL | 0 refills | Status: DC

## 2017-08-24 NOTE — Progress Notes (Signed)
Name: BRIN RUGGERIO   MRN: [id number]    DOB: 04/09/1941   Date:08/24/2017       Progress Note  Subjective  Chief Complaint  Chief Complaint  Patient presents with  . Diabetes  . Hypertension  . Weight Loss    HPI  DMII : she takes Metformin daily, she denies side effects of medication, fsbs not checked over the past week, but still in the 140's fasting. Her  A1C in 04/2016 was 7.3%, went down to  6.8% up again to 7.5%, today 7.6%  She has not  been very compliant with her diet, she states she loves candy -and this time of the year she eats Jelly beans She denies polyphagia, polydipsia or polyuria. Eye exam is up to date, she has glaucoma and cataract, but is stillstable. She is only on Xalatan now. Going to [facility]. On 81 mg aspirin, statin therapy, on an ARB  IRW:ERXVQ slightly higher than usual for her, at 140/90 today. She denies side effects of medication, no chest pain, palpitation, or SOB, she is compliant with Telmisartan/HCTZ denies orthostatic changes. She states she feels great.   Hyperlipidemia: taking Atorvastatin, denies myalgia , reviewed labs done 10/2016, we will recheck yearsly  Cough: she states she has a dry cough when exposed to smoke or when also around a restaurant that is frying something. Not associated with SOB. She has been exposed cigarettes all her life, husband is heavy smoker. Discussed spirometry and CXR and she just wants a cxr at this time   Patient Active Problem List   Diagnosis Date Noted  . Sensorineural hearing loss of combined sites, bilateral 11/19/2015  . Glaucoma 08/24/2014  . Cataract 08/24/2014  . Dyslipidemia associated with type 2 diabetes mellitus ([place]) 08/24/2014  . Benign hypertension 08/24/2014  . Dyslipidemia 08/24/2014    Past Surgical History:  Procedure Laterality Date  . BREAST BIOPSY Bilateral    rt/clip-02/27/03, let - all neg  . BREAST LUMPECTOMY N/A 1982  . VAGINAL HYSTERECTOMY  1986    Family  History  Problem Relation Age of Onset  . Depression Mother   . Rheum arthritis Father   . Depression Sister   . Hypertension Sister   . Breast cancer Sister   . Alcohol abuse Brother   . Hypertension Brother   . Hypertension Sister   . Cancer Sister        lung  . Hypertension Sister   . Breast cancer Cousin   . Hypertension Sister   . Depression Sister   . Breast cancer Paternal Aunt     Social History   Socioeconomic History  . Marital status: Married    Spouse name: Not on file  . Number of children: 2  . Years of education: 57  . Highest education level: Not on file  Occupational History  . Occupation: Retired  Scientific laboratory technician  . Financial resource strain: Not on file  . Food insecurity:    Worry: Not on file    Inability: Not on file  . Transportation needs:    Medical: Not on file    Non-medical: Not on file  Tobacco Use  . Smoking status: Never Smoker  . Smokeless tobacco: Never Used  Substance and Sexual Activity  . Alcohol use: No    Alcohol/week: 0.0 oz  . Drug use: No  . Sexual activity: Not Currently  Lifestyle  . Physical activity:    Days per week: Not on file    Minutes  per session: Not on file  . Stress: Not on file  Relationships  . Social connections:    Talks on phone: Not on file    Gets together: Not on file    Attends religious service: Not on file    Active member of club or organization: Not on file    Attends meetings of clubs or organizations: Not on file    Relationship status: Not on file  . Intimate partner violence:    Fear of current or ex partner: Not on file    Emotionally abused: Not on file    Physically abused: Not on file    Forced sexual activity: Not on file  Other Topics Concern  . Not on file  Social History Narrative  . Not on file     Current Outpatient Medications:  .  aspirin 81 MG tablet, Take by mouth., Disp: , Rfl:  .  atorvastatin (LIPITOR) 40 MG tablet, Take 1 tablet (40 mg total) by mouth daily at  6 PM., Disp: 90 tablet, Rfl: 1 .  glucose blood (ONE TOUCH ULTRA TEST) test strip, Use as instructed, Disp: 100 each, Rfl: 12 .  latanoprost (XALATAN) 0.005 % ophthalmic solution, Apply to eye., Disp: , Rfl:  .  MULTIPLE VITAMIN PO, Take by mouth., Disp: , Rfl:  .  telmisartan-hydrochlorothiazide (MICARDIS HCT) 80-12.5 MG tablet, TAKE 1 TABLET BY MOUTH EVERY DAY, Disp: 90 tablet, Rfl: 1 .  Empagliflozin-metFORMIN HCl ER (SYNJARDY XR) 12.09-998 MG TB24, Take 1 tablet by mouth daily., Disp: 90 tablet, Rfl: 0  Allergies  Allergen Reactions  . Clindamycin/Lincomycin Nausea Only     ROS   Constitutional: Negative for fever or weight change.  Respiratory: Positive  for cough ( around fried food/from a restaurant)  But no shortness of breath.   Cardiovascular: Negative for chest pain or palpitations.  Gastrointestinal: Negative for abdominal pain, no bowel changes.  Musculoskeletal: Negative for gait problem or joint swelling.  Skin: Negative for rash.  Neurological: Negative for dizziness or headache.  No other specific complaints in a complete review of systems (except as listed in HPI above).  Objective  Vitals:   08/24/17 0803  BP: 140/90  Pulse: 64  Resp: 16  SpO2: 98%  Weight: 202 lb 14.4 oz (92 kg)  Height: 5\' 10"  (1.778 m)    Body mass index is 29.11 kg/m.  Physical Exam  Constitutional: Patient appears well-developed and well-nourished. Overweight. No distress.  HEENT: head atraumatic, normocephalic, pupils equal and reactive to light,neck supple, throat within normal limits Cardiovascular: Normal rate, regular rhythm and normal heart sounds.  No murmur heard. No BLE edema. Pulmonary/Chest: Effort normal and breath sounds normal. No respiratory distress. Abdominal: Soft.  There is no tenderness. Psychiatric: Patient has a normal mood and affect. behavior is normal. Judgment and thought content normal.  Recent Results (from the past 2160 hour(s))  POCT HgB A1C      Status: Abnormal   Collection Time: 08/24/17  8:13 AM  Result Value Ref Range   Hemoglobin A1C 7.6      PHQ2/9: Depression screen [facility] 2/9 05/12/2017 11/09/2016 11/02/2016 05/04/2016 11/03/2015  Decreased Interest 0 0 0 0 0  Down, Depressed, Hopeless 0 0 0 0 0  PHQ - 2 Score 0 0 0 0 0     Fall Risk: Fall Risk  08/24/2017 08/24/2017 05/12/2017 11/09/2016 11/02/2016  Falls in the past year? Yes No No Yes No  Comment - - - - -  Number falls  in past yr: 1 - - 1 -  Injury with Fall? No - - No -  Comment - - - - -  Risk for fall due to : - - - - -  Follow up - - - Falls evaluation completed -    Functional Status Survey: Is the patient deaf or have difficulty hearing?: No Does the patient have difficulty seeing, even when wearing glasses/contacts?: No Does the patient have difficulty concentrating, remembering, or making decisions?: No Does the patient have difficulty walking or climbing stairs?: No Does the patient have difficulty dressing or bathing?: No Does the patient have difficulty doing errands alone such as visiting a doctor's office or shopping?: No    Assessment & Plan  1. Dyslipidemia associated with type 2 diabetes mellitus (HCC)  - POCT HgB A1C still going up a little, we will try to switch from Metformin to [facility], she is worried about adding another pill, and states will only take one pill daily. Discussed possible side effects, including yeast infection and increase in UTI - Empagliflozin-metFORMIN HCl ER (SYNJARDY XR) 12.09-998 MG TB24; Take 1 tablet by mouth daily.  Dispense: 90 tablet; Refill: 0  2. Benign hypertension  bp is at goal, continue medication   3. Urge incontinence of urine  Only when getting to the bathroom.   4. Cough in adult  CXR

## 2017-08-29 ENCOUNTER — Encounter: Payer: Self-pay | Admitting: Family Medicine

## 2017-08-29 LAB — HM DIABETES EYE EXAM

## 2017-09-22 ENCOUNTER — Other Ambulatory Visit: Payer: Self-pay | Admitting: Family Medicine

## 2017-09-22 DIAGNOSIS — Z1231 Encounter for screening mammogram for malignant neoplasm of breast: Secondary | ICD-10-CM

## 2017-10-22 ENCOUNTER — Other Ambulatory Visit: Payer: Self-pay | Admitting: Family Medicine

## 2017-10-22 DIAGNOSIS — I10 Essential (primary) hypertension: Secondary | ICD-10-CM

## 2017-10-31 ENCOUNTER — Ambulatory Visit
Admission: RE | Admit: 2017-10-31 | Discharge: 2017-10-31 | Disposition: A | Discharge status: Home / Self Care | Payer: Medicare Other | Source: Ambulatory Visit | Attending: Family Medicine | Admitting: Family Medicine

## 2017-10-31 DIAGNOSIS — Z1231 Encounter for screening mammogram for malignant neoplasm of breast: Secondary | ICD-10-CM | POA: Diagnosis present

## 2017-11-20 ENCOUNTER — Other Ambulatory Visit: Payer: Self-pay | Admitting: Family Medicine

## 2017-11-20 DIAGNOSIS — E785 Hyperlipidemia, unspecified: Principal | ICD-10-CM

## 2017-11-20 DIAGNOSIS — E1169 Type 2 diabetes mellitus with other specified complication: Secondary | ICD-10-CM

## 2017-12-20 ENCOUNTER — Encounter: Payer: Medicare Other | Admitting: Family Medicine

## 2017-12-20 ENCOUNTER — Ambulatory Visit (INDEPENDENT_AMBULATORY_CARE_PROVIDER_SITE_OTHER): Payer: Medicare Other

## 2017-12-20 VITALS — BP 124/70 | HR 60 | Temp 98.2°F | Resp 14 | Ht 70.0 in | Wt 186.5 lb

## 2017-12-20 DIAGNOSIS — M8589 Other specified disorders of bone density and structure, multiple sites: Secondary | ICD-10-CM | POA: Diagnosis not present

## 2017-12-20 DIAGNOSIS — Z0000 Encounter for general adult medical examination without abnormal findings: Secondary | ICD-10-CM | POA: Diagnosis not present

## 2017-12-20 DIAGNOSIS — E2839 Other primary ovarian failure: Secondary | ICD-10-CM | POA: Diagnosis not present

## 2017-12-20 DIAGNOSIS — Z9181 History of falling: Secondary | ICD-10-CM | POA: Diagnosis not present

## 2017-12-20 NOTE — Patient Instructions (Addendum)
Adrienne Anderson , Thank you for taking time to come for your Medicare Wellness Visit. I appreciate your ongoing commitment to your health goals. Please review the following plan we discussed and let me know if I can assist you in the future.   Screening recommendations/referrals: Colorectal Screening: Up to date Mammogram: Up to date Bone Density: Ordered today  Vision and Dental Exams: Recommended annual ophthalmology exams for early detection of glaucoma and other disorders of the eye Recommended annual dental exams for proper oral hygiene  Diabetic Exams: Recommended annual diabetic eye exams for early detection of retinopathy Recommended annual diabetic foot exams for early detection of peripheral neuropathy.  Diabetic Eye Exam: Up to date Diabetic Foot Exam: To be completed today  Vaccinations: Influenza vaccine: Up to date Pneumococcal vaccine: Up to date Tdap vaccine: Up to date Shingles vaccine: Please call your insurance company to determine your out of pocket expense for the Shingrix vaccine. You may also receive this vaccine at your local pharmacy or Health Dept.  Advanced directives: Please bring a copy of your POA (Power of Attorney) and/or Living Will to your next appointment.  Goals: Recommend to drink at least 6-8 8oz glasses of water per day.  Next appointment: Please schedule your Annual Wellness Visit with your Nurse Health Advisor in one year.  Preventive Care 23 Years and Older, Female Preventive care refers to lifestyle choices and visits with your health care provider that can promote health and wellness. What does preventive care include?  A yearly physical exam. This is also called an annual well check.  Dental exams once or twice a year.  Routine eye exams. Ask your health care provider how often you should have your eyes checked.  Personal lifestyle choices, including:  Daily care of your teeth and gums.  Regular physical activity.  Eating a healthy  diet.  Avoiding tobacco and drug use.  Limiting alcohol use.  Practicing safe sex.  Taking low-dose aspirin every day.  Taking vitamin and mineral supplements as recommended by your health care provider. What happens during an annual well check? The services and screenings done by your health care provider during your annual well check will depend on your age, overall health, lifestyle risk factors, and family history of disease. Counseling  Your health care provider may ask you questions about your:  Alcohol use.  Tobacco use.  Drug use.  Emotional well-being.  Home and relationship well-being.  Sexual activity.  Eating habits.  History of falls.  Memory and ability to understand (cognition).  Work and work Statistician.  Reproductive health. Screening  You may have the following tests or measurements:  Height, weight, and BMI.  Blood pressure.  Lipid and cholesterol levels. These may be checked every 5 years, or more frequently if you are over 41 years old.  Skin check.  Lung cancer screening. You may have this screening every year starting at age 38 if you have a 30-pack-year history of smoking and currently smoke or have quit within the past 15 years.  Fecal occult blood test (FOBT) of the stool. You may have this test every year starting at age 3.  Flexible sigmoidoscopy or colonoscopy. You may have a sigmoidoscopy every 5 years or a colonoscopy every 10 years starting at age 28.  Hepatitis C blood test.  Hepatitis B blood test.  Sexually transmitted disease (STD) testing.  Diabetes screening. This is done by checking your blood sugar (glucose) after you have not eaten for a while (fasting). You  may have this done every 1-3 years.  Bone density scan. This is done to screen for osteoporosis. You may have this done starting at age 74.  Mammogram. This may be done every 1-2 years. Talk to your health care provider about how often you should have  regular mammograms. Talk with your health care provider about your test results, treatment options, and if necessary, the need for more tests. Vaccines  Your health care provider may recommend certain vaccines, such as:  Influenza vaccine. This is recommended every year.  Tetanus, diphtheria, and acellular pertussis (Tdap, Td) vaccine. You may need a Td booster every 10 years.  Zoster vaccine. You may need this after age 2.  Pneumococcal 13-valent conjugate (PCV13) vaccine. One dose is recommended after age 36.  Pneumococcal polysaccharide (PPSV23) vaccine. One dose is recommended after age 37. Talk to your health care provider about which screenings and vaccines you need and how often you need them. This information is not intended to replace advice given to you by your health care provider. Make sure you discuss any questions you have with your health care provider. Document Released: 06/06/2015 Document Revised: 01/28/2016 Document Reviewed: 03/11/2015 Elsevier Interactive Patient Education  2017 [place] Prevention in the Home Falls can cause injuries. They can happen to people of all ages. There are many things you can do to make your home safe and to help prevent falls. What can I do on the outside of my home?  Regularly fix the edges of walkways and driveways and fix any cracks.  Remove anything that might make you trip as you walk through a door, such as a raised step or threshold.  Trim any bushes or trees on the path to your home.  Use bright outdoor lighting.  Clear any walking paths of anything that might make someone trip, such as rocks or tools.  Regularly check to see if handrails are loose or broken. Make sure that both sides of any steps have handrails.  Any raised decks and porches should have guardrails on the edges.  Have any leaves, snow, or ice cleared regularly.  Use sand or salt on walking paths during winter.  Clean up any spills in your  garage right away. This includes oil or grease spills. What can I do in the bathroom?  Use night lights.  Install grab bars by the toilet and in the tub and shower. Do not use towel bars as grab bars.  Use non-skid mats or decals in the tub or shower.  If you need to sit down in the shower, use a plastic, non-slip stool.  Keep the floor dry. Clean up any water that spills on the floor as soon as it happens.  Remove soap buildup in the tub or shower regularly.  Attach bath mats securely with double-sided non-slip rug tape.  Do not have throw rugs and other things on the floor that can make you trip. What can I do in the bedroom?  Use night lights.  Make sure that you have a light by your bed that is easy to reach.  Do not use any sheets or blankets that are too big for your bed. They should not hang down onto the floor.  Have a firm chair that has side arms. You can use this for support while you get dressed.  Do not have throw rugs and other things on the floor that can make you trip. What can I do in the kitchen?  Clean  up any spills right away.  Avoid walking on wet floors.  Keep items that you use a lot in easy-to-reach places.  If you need to reach something above you, use a strong step stool that has a grab bar.  Keep electrical cords out of the way.  Do not use floor polish or wax that makes floors slippery. If you must use wax, use non-skid floor wax.  Do not have throw rugs and other things on the floor that can make you trip. What can I do with my stairs?  Do not leave any items on the stairs.  Make sure that there are handrails on both sides of the stairs and use them. Fix handrails that are broken or loose. Make sure that handrails are as long as the stairways.  Check any carpeting to make sure that it is firmly attached to the stairs. Fix any carpet that is loose or worn.  Avoid having throw rugs at the top or bottom of the stairs. If you do have throw  rugs, attach them to the floor with carpet tape.  Make sure that you have a light switch at the top of the stairs and the bottom of the stairs. If you do not have them, ask someone to add them for you. What else can I do to help prevent falls?  Wear shoes that:  Do not have high heels.  Have rubber bottoms.  Are comfortable and fit you well.  Are closed at the toe. Do not wear sandals.  If you use a stepladder:  Make sure that it is fully opened. Do not climb a closed stepladder.  Make sure that both sides of the stepladder are locked into place.  Ask someone to hold it for you, if possible.  Clearly mark and make sure that you can see:  Any grab bars or handrails.  First and last steps.  Where the edge of each step is.  Use tools that help you move around (mobility aids) if they are needed. These include:  Canes.  Walkers.  Scooters.  Crutches.  Turn on the lights when you go into a dark area. Replace any light bulbs as soon as they burn out.  Set up your furniture so you have a clear path. Avoid moving your furniture around.  If any of your floors are uneven, fix them.  If there are any pets around you, be aware of where they are.  Review your medicines with your doctor. Some medicines can make you feel dizzy. This can increase your chance of falling. Ask your doctor what other things that you can do to help prevent falls. This information is not intended to replace advice given to you by your health care provider. Make sure you discuss any questions you have with your health care provider. Document Released: 03/06/2009 Document Revised: 10/16/2015 Document Reviewed: 06/14/2014 Elsevier Interactive Patient Education  2017 [employer].

## 2017-12-20 NOTE — Progress Notes (Addendum)
Subjective:   Adrienne Anderson is a 77 y.o. female who presents for Medicare Annual (Subsequent) preventive examination.  Review of Systems:  N/A Cardiac Risk Factors include: advanced age (>66men, >70 women);diabetes mellitus;dyslipidemia;hypertension;sedentary lifestyle     Objective:     Vitals: Resp 12   Ht 5\' 10"  (1.778 m)   Wt 186 lb 8 oz (84.6 kg)   BMI 26.76 kg/m   Body mass index is 26.76 kg/m.  Advanced Directives 12/20/2017 11/09/2016 11/02/2016 05/04/2016 11/03/2015 09/08/2015 05/30/2015  Does Patient Have a Medical Advance Directive? No Yes Yes Yes Yes Yes No;Yes  Type of Advance Directive - [place];Living will [place];Living will [place];Living will [place];Living will [place];Living will [place];Living will  Does patient want to make changes to medical advance directive? - - - - - No - Patient declined No - Patient declined  Copy of [place] in Chart? - - No - copy requested No - copy requested No - copy requested No - copy requested No - copy requested  Would patient like information on creating a medical advance directive? Yes (MAU/Ambulatory/Procedural Areas - Information given) - - - - - No - patient declined information    Tobacco Social History   Tobacco Use  Smoking Status Never Smoker  Smokeless Tobacco Never Used  Tobacco Comment   smoking cessation materials not required     Counseling given: No Comment: smoking cessation materials not required  Clinical Intake:  Pre-visit preparation completed: Yes  Pain : No/denies pain   BMI - recorded: 29.11 Nutritional Status: BMI 25 -29 Overweight Nutritional Risks: None Diabetes: Yes CBG done?: No Did pt. bring in CBG monitor from home?: No  Nutrition Risk Assessment: Has the patient had any N/V/D within the last 2 months?  No Does the patient have any  non-healing wounds?  No Has the patient had any unintentional weight loss or weight gain?  No  Is the patient diabetic?  Yes If diabetic, was a CBG obtained today?  No Did the patient bring in their glucometer from home?  No Comments: Pt monitors CBG's once per week. Denies any financial strains with the device or supplies.  Diabetic Exams: Diabetic Eye Exam: Completed 08/29/17. Diabetic Foot Exam: Completed 11/02/16. Pt has been advised about the importance in completing this exam. Pt is scheduled for diabetic foot exam today with Dr. Ancil Boozer.  How often do you need to have someone help you when you read instructions, pamphlets, or other written materials from your doctor or pharmacy?: 1 - Never  Interpreter Needed?: No  Information entered by :: Idell Pickles, LPN  Past Medical History:  Diagnosis Date  . Cataract   . Diabetes mellitus without complication ([place])   . Glaucoma   . Hyperlipidemia   . Hypertension   . Obesity    Past Surgical History:  Procedure Laterality Date  . BREAST BIOPSY Bilateral    rt/clip-02/27/03, left - all neg  . BREAST LUMPECTOMY Bilateral 1982   neg  . VAGINAL HYSTERECTOMY  1986   Family History  Problem Relation Age of Onset  . Depression Mother   . Rheum arthritis Father   . Depression Sister   . Hypertension Sister   . Breast cancer Sister   . Alcohol abuse Brother   . Hypertension Brother   . Hypertension Sister   . Cancer Sister        lung  .  Hypertension Sister   . Breast cancer Cousin        pat cousin  . Hypertension Sister   . Depression Sister   . Breast cancer Paternal Aunt    Social History   Socioeconomic History  . Marital status: Married    Spouse name: Not on file  . Number of children: 2  . Years of education: 26  . Highest education level: Not on file  Occupational History  . Occupation: Retired  Scientific laboratory technician  . Financial resource strain: Not on file  . Food insecurity:    Worry: Never true    Inability:  Never true  . Transportation needs:    Medical: No    Non-medical: No  Tobacco Use  . Smoking status: Never Smoker  . Smokeless tobacco: Never Used  . Tobacco comment: smoking cessation materials not required  Substance and Sexual Activity  . Alcohol use: No    Alcohol/week: 0.0 oz  . Drug use: No  . Sexual activity: Not Currently  Lifestyle  . Physical activity:    Days per week: 0 days    Minutes per session: 0 min  . Stress: Not at all  Relationships  . Social connections:    Talks on phone: Patient refused    Gets together: Patient refused    Attends religious service: Patient refused    Active member of club or organization: Patient refused    Attends meetings of clubs or organizations: Patient refused    Relationship status: Married  Other Topics Concern  . Not on file  Social History Narrative  . Not on file    Outpatient Encounter Medications as of 12/20/2017  Medication Sig  . aspirin 81 MG tablet Take by mouth.  Marland Kitchen atorvastatin (LIPITOR) 40 MG tablet Take 1 tablet (40 mg total) by mouth daily at 6 PM.  . Empagliflozin-metFORMIN HCl ER (SYNJARDY XR) 12.09-998 MG TB24 Take 1 tablet by mouth daily.  Marland Kitchen glucose blood (ONE TOUCH ULTRA TEST) test strip Use as instructed  . latanoprost (XALATAN) 0.005 % ophthalmic solution Apply to eye.  . MULTIPLE VITAMIN PO Take by mouth.  . telmisartan-hydrochlorothiazide (MICARDIS HCT) 80-12.5 MG tablet TAKE 1 TABLET BY MOUTH EVERY DAY   No facility-administered encounter medications on file as of 12/20/2017.     Activities of Daily Living In your present state of health, do you have any difficulty performing the following activities: 12/20/2017 08/24/2017  Hearing? N N  Comment denies hearing aids -  Vision? N N  Comment wears eyeglasses -  Difficulty concentrating or making decisions? - N  Walking or climbing stairs? - N  Dressing or bathing? N N  Doing errands, shopping? N N  Preparing Food and eating ? N -  Comment denies  dentures -  Using the Toilet? N -  Managing your Medications? N -  Managing your Finances? N -  Housekeeping or managing your Housekeeping? N -  Some recent data might be hidden    Patient Care Team: Steele Sizer, MD as PCP - General (Family Medicine) Vin-Parikh, Deirdre Peer, MD as Consulting Physician (Ophthalmology) Beverly Gust, MD as Consulting Physician (Otolaryngology)    Assessment:   This is a routine wellness examination for Adrienne Anderson.  Exercise Activities and Dietary recommendations Current Exercise Habits: The patient does not participate in regular exercise at present, Exercise limited by: None identified  Goals    . DIET - INCREASE WATER INTAKE     Recommend to drink at least 6-8 8oz  glasses of water per day.       Fall Risk Fall Risk  12/20/2017 08/24/2017 08/24/2017 05/12/2017 11/09/2016  Falls in the past year? No Yes No No Yes  Comment - - - - -  Number falls in past yr: - 1 - - 1  Injury with Fall? - No - - No  Comment - - - - -  Risk for fall due to : Impaired vision - - - -  Risk for fall due to: Comment wears eyeglasses - - - -  Follow up - - - - Falls evaluation completed   [place]: Is your home free of loose throw rugs in walkways, pet beds, electrical cords, etc? Yes Is there adequate lighting in your home to reduce risk of falls?  Yes Are there stairs in or around your home WITH handrails? Yes  ASSISTIVE DEVICES UTILIZED TO PREVENT FALLS: Use of a cane, walker or w/c? No Grab bars in the bathroom? No  Shower chair or a place to sit while bathing? Yes An elevated toilet seat or a handicapped toilet? Yes  Timed Get Up and Go Performed: Yes. Pt ambulated 10 feet within 11 sec. Gait slow, steady and without the use of an assistive device. No intervention required at this time. Fall risk prevention has been discussed.  Community Resource Referral:  Data processing manager Referral sent to [employer] for installation  of grab bars in the shower.  Depression Screen PHQ 2/9 Scores 05/12/2017 11/09/2016 11/02/2016 05/04/2016  PHQ - 2 Score 0 0 0 0     Cognitive Function     6CIT Screen 12/20/2017  What Year? 0 points  What month? 0 points  What time? 0 points  Count back from 20 0 points  Months in reverse 0 points  Repeat phrase 0 points  Total Score 0    Immunization History  Administered Date(s) Administered  . Influenza Split 03/25/2014  . Influenza, High Dose Seasonal PF 03/10/2016, 03/28/2017  . Influenza, Seasonal, Injecte, Preservative Fre 05/03/2012  . Influenza,inj,Quad PF,6+ Mos 03/06/2015  . Pneumococcal Conjugate-13 10/30/2014  . Pneumococcal Polysaccharide-23 05/03/2012  . Tdap 08/03/2012  . Zoster 11/06/2012    Qualifies for Shingles Vaccine? Yes. Zostavax completed 11/06/12. Due for Shingrix. Education has been provided regarding the importance of this vaccine. Pt has been advised to call insurance company to determine out of pocket expense. Advised may also receive vaccine at local pharmacy or Health Dept. Verbalized acceptance and understanding.  Screening Tests Health Maintenance  Topic Date Due  . FOOT EXAM  11/02/2017  . INFLUENZA VACCINE  12/22/2017  . HEMOGLOBIN A1C  02/23/2018  . COLONOSCOPY  04/24/2018  . OPHTHALMOLOGY EXAM  08/30/2018  . MAMMOGRAM  11/01/2018  . TETANUS/TDAP  08/04/2022  . DEXA SCAN  Completed  . PNA vac Low Risk Adult  Completed    Cancer Screenings: Lung: Low Dose CT Chest recommended if Age 36-80 years, 30 pack-year currently smoking OR have quit w/in 15years. Patient does not qualify. Breast:  Up to date on Mammogram? Yes. Completed 10/31/17. Repeat every year.  Up to date of Bone Density/Dexa? No. Completed 11/03/15. Results reflect osteopenia. Repeat every 2 years. Ordered today. Message sent to referral coordinator for scheduling purposes. Pt aware she will receive a call from our office re: her appt. Colorectal: Completed 04/24/08. Repeat  every 10 years  Additional Screenings: Hepatitis C Screening: Does not qualify    Plan:  I have personally reviewed and addressed  the Medicare Annual Wellness questionnaire and have noted the following in the patient's chart:  A. Medical and social history B. Use of alcohol, tobacco or illicit drugs  C. Current medications and supplements D. Functional ability and status E.  Nutritional status F.  Physical activity G. Advance directives H. List of other physicians I.  Hospitalizations, surgeries, and ER visits in previous 12 months J.  [place] such as hearing and vision if needed, cognitive and depression L. Referrals and appointments  In addition, I have reviewed and discussed with patient certain preventive protocols, quality metrics, and best practice recommendations. A written personalized care plan for preventive services as well as general preventive health recommendations were provided to patient.  See attached scanned questionnaire for additional information.   Signed,  Aleatha Borer, LPN Nurse Health Advisor   I have reviewed this encounter including the documentation in this note and/or discussed this patient with the provider, Aleatha Borer, LPN. I am certifying that I agree with the content of this note as supervising physician.  Steele Sizer, MD [facility] 12/20/2017, 1:37 PM

## 2017-12-21 NOTE — Progress Notes (Signed)
Too soon for follow up, re-scheduled.

## 2017-12-28 ENCOUNTER — Other Ambulatory Visit: Payer: Self-pay | Admitting: Family Medicine

## 2017-12-28 DIAGNOSIS — E785 Hyperlipidemia, unspecified: Secondary | ICD-10-CM

## 2018-01-12 ENCOUNTER — Encounter: Payer: Self-pay | Admitting: Family Medicine

## 2018-01-12 ENCOUNTER — Ambulatory Visit (INDEPENDENT_AMBULATORY_CARE_PROVIDER_SITE_OTHER): Payer: Medicare Other | Admitting: Family Medicine

## 2018-01-12 VITALS — BP 118/68 | HR 73 | Temp 98.3°F | Resp 16 | Ht 70.0 in | Wt 185.5 lb

## 2018-01-12 DIAGNOSIS — E785 Hyperlipidemia, unspecified: Secondary | ICD-10-CM | POA: Diagnosis not present

## 2018-01-12 DIAGNOSIS — M8588 Other specified disorders of bone density and structure, other site: Secondary | ICD-10-CM

## 2018-01-12 DIAGNOSIS — N3941 Urge incontinence: Secondary | ICD-10-CM

## 2018-01-12 DIAGNOSIS — I10 Essential (primary) hypertension: Secondary | ICD-10-CM | POA: Diagnosis not present

## 2018-01-12 DIAGNOSIS — R059 Cough, unspecified: Secondary | ICD-10-CM

## 2018-01-12 DIAGNOSIS — E1169 Type 2 diabetes mellitus with other specified complication: Secondary | ICD-10-CM

## 2018-01-12 DIAGNOSIS — R05 Cough: Secondary | ICD-10-CM

## 2018-01-12 LAB — POCT UA - MICROALBUMIN: Microalbumin Ur, POC: 20 mg/L

## 2018-01-12 LAB — POCT GLYCOSYLATED HEMOGLOBIN (HGB A1C): HbA1c, POC (controlled diabetic range): 6.3 % (ref 0.0–7.0)

## 2018-01-12 MED ORDER — EMPAGLIFLOZIN-METFORMIN HCL ER 12.5-1000 MG PO TB24: 1.0000 | ORAL_TABLET | Freq: Every day | ORAL | 1 refills | Status: DC

## 2018-01-12 MED ORDER — TELMISARTAN-HCTZ 40-12.5 MG PO TABS: 1.0000 | ORAL_TABLET | Freq: Every day | ORAL | 0 refills | Status: DC

## 2018-01-12 NOTE — Progress Notes (Signed)
Name: Adrienne Anderson   MRN: [id number]    DOB: May 25, 1940   Date:01/12/2018       Progress Note  Subjective  Chief Complaint  Chief Complaint  Patient presents with  . Annual Exam  . Medication Refill  . Diabetes  . Hypertension  . Hyperlipidemia    HPI  DMII : she takes Metformin daily, she denies side effects of medication, fsbsnot checked over the past week, but still in the 140's fasting. Her  A1C in 12/2017was 7.3%, went down to 6.8% up again to 7.5%, 7.6% and today is down to 6.3%  She hasnotbeen verycompliant with her diet, no longer having a lot of candy. She denies polyphagia, polydipsia or polyuria. Eye exam is up to date, she has glaucoma and cataract, but is stillstable. She is only on Xalatan now.  On 81 mg aspirin, statin therapy, on an ARB  HTN:bp is improving and today is towards low end of normal. We will try going down on Micardis hctz from 80/12.5 to 40/12.5 and recheck bp in our office when she comes in for flu shot in a few weeks.  She denies side effects of medication, no chest pain, palpitation, or SOB.  Hyperlipidemia: taking Atorvastatin, denies myalgia at goal.   Cough: she states she has a dry cough when exposed to smoke or when also around a restaurant that is frying something. Not associated with SOB. She has been exposed cigarettes all her life, husband was a heavy smoker, CXR showed bronchitis. Discussed spirometry, she wants to hold off for now. Husband just quit smoking and she wants to see if her cough will improve.    Patient Active Problem List   Diagnosis Date Noted  . Sensorineural hearing loss of combined sites, bilateral 11/19/2015  . Glaucoma 08/24/2014  . Cataract 08/24/2014  . Dyslipidemia associated with type 2 diabetes mellitus ([place]) 08/24/2014  . Benign hypertension 08/24/2014  . Dyslipidemia 08/24/2014    Past Surgical History:  Procedure Laterality Date  . BREAST BIOPSY Bilateral    rt/clip-02/27/03, left - all neg   . BREAST LUMPECTOMY Bilateral 1982   neg  . VAGINAL HYSTERECTOMY  1986    Family History  Problem Relation Age of Onset  . Depression Mother   . Rheum arthritis Father   . Hypertension Sister   . Breast cancer Sister   . Alcohol abuse Brother   . Hypertension Sister   . Heart disease Sister   . Cancer Sister        lung  . Hypertension Sister   . Kidney disease Sister   . Breast cancer Cousin        pat cousin  . Hypertension Sister   . Breast cancer Paternal Aunt     Social History   Socioeconomic History  . Marital status: Married    Spouse name: Leane Para  . Number of children: 2  . Years of education: Not on file  . Highest education level: Bachelor's degree (e.g., BA, AB, BS)  Occupational History  . Occupation: Retired  Scientific laboratory technician  . Financial resource strain: Not hard at all  . Food insecurity:    Worry: Never true    Inability: Never true  . Transportation needs:    Medical: No    Non-medical: No  Tobacco Use  . Smoking status: Never Smoker  . Smokeless tobacco: Never Used  . Tobacco comment: smoking cessation materials not required  Substance and Sexual Activity  . Alcohol use: No  Alcohol/week: 0.0 standard drinks  . Drug use: No  . Sexual activity: Not Currently  Lifestyle  . Physical activity:    Days per week: 0 days    Minutes per session: 0 min  . Stress: Not at all  Relationships  . Social connections:    Talks on phone: More than three times a week    Gets together: Once a week    Attends religious service: More than 4 times per year    Active member of club or organization: Yes    Attends meetings of clubs or organizations: More than 4 times per year    Relationship status: Married  . Intimate partner violence:    Fear of current or ex partner: No    Emotionally abused: No    Physically abused: No    Forced sexual activity: No  Other Topics Concern  . Not on file  Social History Narrative   Husband is now on dialysis      Current Outpatient Medications:  .  aspirin 81 MG tablet, Take by mouth., Disp: , Rfl:  .  atorvastatin (LIPITOR) 40 MG tablet, TAKE 1 TABLET (40 MG TOTAL) BY MOUTH DAILY AT 6 PM., Disp: 90 tablet, Rfl: 1 .  brimonidine (ALPHAGAN) 0.15 % ophthalmic solution, Place 1 drop into both eyes 2 (two) times daily. x30 days, Disp: , Rfl: 5 .  Empagliflozin-metFORMIN HCl ER (SYNJARDY XR) 12.09-998 MG TB24, Take 1 tablet by mouth daily., Disp: 90 tablet, Rfl: 1 .  glucose blood (ONE TOUCH ULTRA TEST) test strip, Use as instructed, Disp: 100 each, Rfl: 12 .  latanoprost (XALATAN) 0.005 % ophthalmic solution, Apply to eye., Disp: , Rfl:  .  MULTIPLE VITAMIN PO, Take by mouth., Disp: , Rfl:  .  telmisartan-hydrochlorothiazide (MICARDIS HCT) 40-12.5 MG tablet, Take 1 tablet by mouth daily., Disp: 90 tablet, Rfl: 0  Allergies  Allergen Reactions  . Clindamycin/Lincomycin Nausea Only     ROS  Constitutional: Negative for fever or weight change.  Respiratory: Negative for cough and shortness of breath.   Cardiovascular: Negative for chest pain or palpitations.  Gastrointestinal: Negative for abdominal pain, no bowel changes.  Musculoskeletal: Negative for gait problem or joint swelling.  Skin: Negative for rash.  Neurological: Negative for dizziness or headache.  No other specific complaints in a complete review of systems (except as listed in HPI above).  Objective  Vitals:   01/12/18 1144  BP: 118/68  Pulse: 73  Resp: 16  Temp: 98.3 F (36.8 C)  TempSrc: Oral  SpO2: 97%  Weight: 185 lb 8 oz (84.1 kg)  Height: 5\' 10"  (1.778 m)    Body mass index is 26.62 kg/m.  Physical Exam  Constitutional: Patient appears well-developed and well-nourished. Overweight.  No distress.  HEENT: head atraumatic, normocephalic, pupils equal and reactive to light,neck supple, throat within normal limits Cardiovascular: Normal rate, regular rhythm and normal heart sounds.  No murmur heard. Non  pitting trace of bilateral ankle  edema. Pulmonary/Chest: Effort normal and breath sounds normal. No respiratory distress. Abdominal: Soft.  There is no tenderness. Psychiatric: Patient has a normal mood and affect. behavior is normal. Judgment and thought content normal.   Recent Results (from the past 2160 hour(s))  POCT HgB A1C     Status: Normal   Collection Time: 01/12/18 11:43 AM  Result Value Ref Range   Hemoglobin A1C     HbA1c POC (<> result, manual entry)     HbA1c, POC (prediabetic range)  HbA1c, POC (controlled diabetic range) 6.3 0.0 - 7.0 %  POCT UA - Microalbumin     Status: Normal   Collection Time: 01/12/18 11:43 AM  Result Value Ref Range   Microalbumin Ur, POC 20 mg/L   Creatinine, POC     Albumin/Creatinine Ratio, Urine, POC      Diabetic Foot Exam: Diabetic Foot Exam - Simple   Simple Foot Form Diabetic Foot exam was performed with the following findings:  Yes 01/12/2018 12:11 PM  Visual Inspection See comments:  Yes Sensation Testing Intact to touch and monofilament testing bilaterally:  Yes Pulse Check Posterior Tibialis and Dorsalis pulse intact bilaterally:  Yes Comments Scar from previous surgery       PHQ2/9: Depression screen [facility] 2/9 01/12/2018 12/20/2017 05/12/2017 11/09/2016 11/02/2016  Decreased Interest 0 0 0 0 0  Down, Depressed, Hopeless 0 0 0 0 0  PHQ - 2 Score 0 0 0 0 0  Altered sleeping 0 0 - - -  Tired, decreased energy 2 0 - - -  Change in appetite 0 0 - - -  Feeling bad or failure about yourself  0 0 - - -  Trouble concentrating 0 0 - - -  Moving slowly or fidgety/restless 0 0 - - -  Suicidal thoughts 0 0 - - -  PHQ-9 Score 2 0 - - -  Difficult doing work/chores Not difficult at all Not difficult at all - - -     Fall Risk: Fall Risk  01/12/2018 12/20/2017 08/24/2017 08/24/2017 05/12/2017  Falls in the past year? No Yes Yes No No  Comment - trip over curb - - -  Number falls in past yr: - 1 1 - -  Injury with Fall? - No No -  -  Comment - - - - -  Risk for fall due to : - Impaired vision - - -  Risk for fall due to: Comment - wears eyeglasses; glaucoma; B cataracts - - -  Follow up - Falls evaluation completed;Education provided;Falls prevention discussed - - -     Functional Status Survey: Is the patient deaf or have difficulty hearing?: No(denies hearing aids) Does the patient have difficulty seeing, even when wearing glasses/contacts?: No(wears eyeglasses; glaucoma; B cataracts) Does the patient have difficulty concentrating, remembering, or making decisions?: No Does the patient have difficulty walking or climbing stairs?: No Does the patient have difficulty dressing or bathing?: No Does the patient have difficulty doing errands alone such as visiting a doctor's office or shopping?: No   Assessment & Plan  1. Dyslipidemia associated with type 2 diabetes mellitus ([facility])  - POCT HgB A1C - POCT UA - Microalbumin - Empagliflozin-metFORMIN HCl ER (SYNJARDY XR) 12.09-998 MG TB24; Take 1 tablet by mouth daily.  Dispense: 90 tablet; Refill: 1  At goal , continue current regiment   2. Benign hypertension  - telmisartan-hydrochlorothiazide (MICARDIS HCT) 40-12.5 MG tablet; Take 1 tablet by mouth daily.  Dispense: 90 tablet; Refill: 0  3. Dyslipidemia  On statin therapy   4. Urge incontinence of urine  She states symptoms have improved   5. Osteopenia of multiple sites  - DG Bone Density  6. Cough in adult  Monitor for now, bronchitis on CXR likely from second hand smoking, but needs spirometry to confirm.

## 2018-01-12 NOTE — Patient Instructions (Signed)
Call [place] for bone density  Call insurance to find out if you can get Shingrix ( shingles vaccine) in our office

## 2018-01-19 ENCOUNTER — Other Ambulatory Visit: Payer: Self-pay | Admitting: Family Medicine

## 2018-01-19 DIAGNOSIS — I10 Essential (primary) hypertension: Secondary | ICD-10-CM

## 2018-02-02 ENCOUNTER — Ambulatory Visit (INDEPENDENT_AMBULATORY_CARE_PROVIDER_SITE_OTHER): Payer: Medicare Other

## 2018-02-02 VITALS — BP 136/78

## 2018-02-02 DIAGNOSIS — Z23 Encounter for immunization: Secondary | ICD-10-CM

## 2018-02-02 NOTE — Progress Notes (Signed)
Patient is here for a blood pressure check. Patient denies chest pain, palpitations, shortness of breath or visual disturbances. At previous visit blood pressure was 118/68 with a heart rate of 73. Today during nurse visit first check blood pressure was 136/78.  She does take any blood pressure medications.  Patient came in for her High Dose Flu and Shingrix. Patient was told by Dr. Ancil Boozer to call her Insurance first before receiving Shingrix to see if was covered. She was told by Dr. Ancil Boozer if it is she is able to receive it here in our office, Shingrix is covered by her plan. She tolerated it well. NKDA.

## 2018-02-10 ENCOUNTER — Ambulatory Visit (INDEPENDENT_AMBULATORY_CARE_PROVIDER_SITE_OTHER): Payer: Medicare Other

## 2018-02-10 ENCOUNTER — Ambulatory Visit: Payer: Medicare Other | Admitting: Podiatry

## 2018-02-10 ENCOUNTER — Encounter: Payer: Self-pay | Admitting: Podiatry

## 2018-02-10 ENCOUNTER — Other Ambulatory Visit: Payer: Self-pay | Admitting: Podiatry

## 2018-02-10 DIAGNOSIS — M779 Enthesopathy, unspecified: Secondary | ICD-10-CM

## 2018-02-10 DIAGNOSIS — M722 Plantar fascial fibromatosis: Secondary | ICD-10-CM

## 2018-02-10 DIAGNOSIS — M778 Other enthesopathies, not elsewhere classified: Secondary | ICD-10-CM

## 2018-02-10 LAB — COLOGUARD: Cologuard: NEGATIVE

## 2018-02-13 ENCOUNTER — Ambulatory Visit
Admission: RE | Admit: 2018-02-13 | Discharge: 2018-02-13 | Disposition: A | Discharge status: Home / Self Care | Payer: Medicare Other | Source: Ambulatory Visit | Attending: Family Medicine | Admitting: Family Medicine

## 2018-02-13 DIAGNOSIS — M8588 Other specified disorders of bone density and structure, other site: Secondary | ICD-10-CM | POA: Insufficient documentation

## 2018-02-13 NOTE — Progress Notes (Signed)
   HPI: 77 year old female presenting today as a new patient with a chief complaint of pain to the dorsum of the right foot that began one week ago. She reports burning pain to the left heel that began several months ago. Walking and standing increases her symptoms. She has been using an ankle brace and taking Advil for treatment. Patient is here for further evaluation and treatment.   Past Medical History:  Diagnosis Date  . Cataract   . Diabetes mellitus without complication ([place])   . Glaucoma   . Hyperlipidemia   . Hypertension   . Obesity      Physical Exam: General: The patient is alert and oriented x3 in no acute distress.  Dermatology: Skin is warm, dry and supple bilateral lower extremities. Negative for open lesions or macerations.  Vascular: Palpable pedal pulses bilaterally. No edema or erythema noted. Capillary refill within normal limits.  Neurological: Epicritic and protective threshold grossly intact bilaterally.   Musculoskeletal Exam: Range of motion within normal limits to all pedal and ankle joints bilateral. Muscle strength 5/5 in all groups bilateral.   Radiographic Exam:  Normal osseous mineralization. Joint spaces preserved. No fracture/dislocation/boney destruction.    Assessment: 1. Bilateral midfoot capsulitis - currently asymptomatic    Plan of Care:  1. Patient evaluated. X-Rays reviewed.  2. Recommended good shoe gear.  3. Recommended OTC Motrin as needed.  4. Return to clinic as needed.     Edrick Kins, DPM [facility]  Dr. Edrick Kins, DPM    [address], [place] [address]                Office [phone]  Fax [phone]

## 2018-02-22 LAB — FECAL OCCULT BLOOD, IMMUNOCHEMICAL: IFOBT: NEGATIVE

## 2018-03-13 ENCOUNTER — Encounter: Payer: Self-pay | Admitting: Family Medicine

## 2018-04-04 ENCOUNTER — Ambulatory Visit (INDEPENDENT_AMBULATORY_CARE_PROVIDER_SITE_OTHER): Payer: Medicare Other

## 2018-04-04 DIAGNOSIS — Z23 Encounter for immunization: Secondary | ICD-10-CM

## 2018-04-18 ENCOUNTER — Other Ambulatory Visit: Payer: Self-pay | Admitting: Family Medicine

## 2018-04-18 DIAGNOSIS — I10 Essential (primary) hypertension: Secondary | ICD-10-CM

## 2018-05-29 ENCOUNTER — Encounter: Payer: Self-pay | Admitting: Family Medicine

## 2018-05-29 ENCOUNTER — Ambulatory Visit: Payer: Medicare Other | Admitting: Family Medicine

## 2018-05-29 VITALS — BP 136/72 | HR 73 | Temp 97.8°F | Resp 16 | Ht 70.0 in | Wt 183.5 lb

## 2018-05-29 DIAGNOSIS — E1169 Type 2 diabetes mellitus with other specified complication: Secondary | ICD-10-CM | POA: Diagnosis not present

## 2018-05-29 DIAGNOSIS — N3941 Urge incontinence: Secondary | ICD-10-CM | POA: Diagnosis not present

## 2018-05-29 DIAGNOSIS — I10 Essential (primary) hypertension: Secondary | ICD-10-CM | POA: Diagnosis not present

## 2018-05-29 DIAGNOSIS — M8588 Other specified disorders of bone density and structure, other site: Secondary | ICD-10-CM | POA: Diagnosis not present

## 2018-05-29 DIAGNOSIS — E785 Hyperlipidemia, unspecified: Secondary | ICD-10-CM | POA: Diagnosis not present

## 2018-05-29 LAB — POCT GLYCOSYLATED HEMOGLOBIN (HGB A1C): HbA1c, POC (controlled diabetic range): 6.5 % (ref 0.0–7.0)

## 2018-05-29 NOTE — Progress Notes (Signed)
Name: Adrienne Anderson   MRN: [id number]    DOB: 07/06/1940   Date:05/29/2018       Progress Note  Subjective  Chief Complaint  Chief Complaint  Patient presents with  . Medication Refill  . Diabetes  . Hypertension  . Hyperlipidemia    HPI  DMII : she is now on [place]  she denies side effects of medication, she checks glucose occasionally and has been in the low 100's.  HerA1C in 12/2017was 7.3%, went down to 6.8% up again to 7.5%, 7.6%,  6.3%and today was 6.5% She denies polyphagia, polydipsia or polyuria. Eye exam is up to date, she has glaucoma and cataract, but is stillstable. She is only on Xalatan now.  On 81 mg aspirin, statin therapy, on an ARB. She has been compliant with her diet   HTN:bp is at goal, denies chest pain or palpitation. No dizziness.   Hyperlipidemia: taking Atorvastatin, denies myalgia at goal. Recheck labs   Patient Active Problem List   Diagnosis Date Noted  . Sensorineural hearing loss of combined sites, bilateral 11/19/2015  . Glaucoma 08/24/2014  . Cataract 08/24/2014  . Dyslipidemia associated with type 2 diabetes mellitus ([place]) 08/24/2014  . Benign hypertension 08/24/2014  . Dyslipidemia 08/24/2014    Past Surgical History:  Procedure Laterality Date  . BREAST BIOPSY Bilateral    rt/clip-02/27/03, left - all neg  . BREAST LUMPECTOMY Bilateral 1982   neg  . VAGINAL HYSTERECTOMY  1986    Family History  Problem Relation Age of Onset  . Depression Mother   . Rheum arthritis Father   . Hypertension Sister   . Breast cancer Sister   . Alcohol abuse Brother   . Hypertension Sister   . Heart disease Sister   . Cancer Sister        lung  . Hypertension Sister   . Kidney disease Sister   . Breast cancer Cousin        pat cousin  . Hypertension Sister   . Breast cancer Paternal Aunt     Social History   Socioeconomic History  . Marital status: Married    Spouse name: Leane Para  . Number of children: 2  . Years of  education: Not on file  . Highest education level: Bachelor's degree (e.g., BA, AB, BS)  Occupational History  . Occupation: Retired  Scientific laboratory technician  . Financial resource strain: Not hard at all  . Food insecurity:    Worry: Never true    Inability: Never true  . Transportation needs:    Medical: No    Non-medical: No  Tobacco Use  . Smoking status: Never Smoker  . Smokeless tobacco: Never Used  . Tobacco comment: smoking cessation materials not required  Substance and Sexual Activity  . Alcohol use: No    Alcohol/week: 0.0 standard drinks  . Drug use: No  . Sexual activity: Not Currently  Lifestyle  . Physical activity:    Days per week: 0 days    Minutes per session: 0 min  . Stress: Not at all  Relationships  . Social connections:    Talks on phone: More than three times a week    Gets together: Once a week    Attends religious service: More than 4 times per year    Active member of club or organization: Yes    Attends meetings of clubs or organizations: More than 4 times per year    Relationship status: Married  . Intimate partner violence:  Fear of current or ex partner: No    Emotionally abused: No    Physically abused: No    Forced sexual activity: No  Other Topics Concern  . Not on file  Social History Narrative   Husband is now on dialysis     Current Outpatient Medications:  .  aspirin 81 MG tablet, Take by mouth., Disp: , Rfl:  .  atorvastatin (LIPITOR) 40 MG tablet, TAKE 1 TABLET (40 MG TOTAL) BY MOUTH DAILY AT 6 PM., Disp: 90 tablet, Rfl: 1 .  brimonidine (ALPHAGAN) 0.15 % ophthalmic solution, Place 1 drop into both eyes 2 (two) times daily. x30 days, Disp: , Rfl: 5 .  Empagliflozin-metFORMIN HCl ER (SYNJARDY XR) 12.09-998 MG TB24, Take 1 tablet by mouth daily., Disp: 90 tablet, Rfl: 1 .  glucose blood (ONE TOUCH ULTRA TEST) test strip, Use as instructed, Disp: 100 each, Rfl: 12 .  latanoprost (XALATAN) 0.005 % ophthalmic solution, Apply to eye., Disp:  , Rfl:  .  MULTIPLE VITAMIN PO, Take by mouth., Disp: , Rfl:  .  telmisartan-hydrochlorothiazide (MICARDIS HCT) 40-12.5 MG tablet, TAKE 1 TABLET BY MOUTH EVERY DAY, Disp: 90 tablet, Rfl: 0  Allergies  Allergen Reactions  . Clindamycin/Lincomycin Nausea Only    I personally reviewed active problem list, medication list, allergies, family history, social history with the patient/caregiver today.   ROS  Constitutional: Negative for fever or weight change.  Respiratory: Negative for cough and shortness of breath.   Cardiovascular: Negative for chest pain or palpitations.  Gastrointestinal: Negative for abdominal pain, no bowel changes.  Musculoskeletal: Negative for gait problem or joint swelling.  Skin: Negative for rash.  Neurological: Negative for dizziness or headache.  No other specific complaints in a complete review of systems (except as listed in HPI above).  Objective  Vitals:   05/29/18 1033  BP: 136/72  Pulse: 73  Resp: 16  Temp: 97.8 F (36.6 C)  TempSrc: Oral  SpO2: 99%  Weight: 183 lb 8 oz (83.2 kg)  Height: 5\' 10"  (1.778 m)    Body mass index is 26.33 kg/m.  Physical Exam  Constitutional: Patient appears well-developed and well-nourished. Obese  No distress.  HEENT: head atraumatic, normocephalic, pupils equal and reactive to light, neck supple, throat within normal limits Cardiovascular: Normal rate, regular rhythm and normal heart sounds.  No murmur heard. No BLE edema. Pulmonary/Chest: Effort normal and breath sounds normal. No respiratory distress. Abdominal: Soft.  There is no tenderness. Psychiatric: Patient has a normal mood and affect. behavior is normal. Judgment and thought content normal.  Recent Results (from the past 2160 hour(s))  POCT HgB A1C     Status: Normal   Collection Time: 05/29/18 11:06 AM  Result Value Ref Range   Hemoglobin A1C     HbA1c POC (<> result, manual entry)     HbA1c, POC (prediabetic range)     HbA1c, POC  (controlled diabetic range) 6.5 0.0 - 7.0 %     PHQ2/9: Depression screen [facility] 2/9 05/29/2018 01/12/2018 12/20/2017 05/12/2017 11/09/2016  Decreased Interest 0 0 0 0 0  Down, Depressed, Hopeless 0 0 0 0 0  PHQ - 2 Score 0 0 0 0 0  Altered sleeping 0 0 0 - -  Tired, decreased energy 1 2 0 - -  Change in appetite 0 0 0 - -  Feeling bad or failure about yourself  0 0 0 - -  Trouble concentrating 0 0 0 - -  Moving slowly or fidgety/restless  0 0 0 - -  Suicidal thoughts 0 0 0 - -  PHQ-9 Score 1 2 0 - -  Difficult doing work/chores Not difficult at all Not difficult at all Not difficult at all - -     Fall Risk: Fall Risk  05/29/2018 01/12/2018 12/20/2017 08/24/2017 08/24/2017  Falls in the past year? 0 No Yes Yes No  Comment - - trip over curb - -  Number falls in past yr: 0 - 1 1 -  Injury with Fall? 0 - No No -  Comment - - - - -  Risk for fall due to : - - Impaired vision - -  Risk for fall due to: Comment - - wears eyeglasses; glaucoma; B cataracts - -  Follow up - - Falls evaluation completed;Education provided;Falls prevention discussed - -    Functional Status Survey: Is the patient deaf or have difficulty hearing?: No Does the patient have difficulty seeing, even when wearing glasses/contacts?: No Does the patient have difficulty concentrating, remembering, or making decisions?: No Does the patient have difficulty walking or climbing stairs?: No Does the patient have difficulty dressing or bathing?: No Does the patient have difficulty doing errands alone such as visiting a doctor's office or shopping?: No    Assessment & Plan  1. Dyslipidemia associated with type 2 diabetes mellitus ([place])  - POCT HgB A1C - Lipid panel  2. Benign hypertension  - COMPLETE METABOLIC PANEL WITH GFR  3. Osteopenia of lumbar spine  Continue high calcium diet  4. Urge incontinence of urine  Discussed bladder training

## 2018-05-31 LAB — COMPLETE METABOLIC PANEL WITH GFR
AG Ratio: 1.4 (calc) (ref 1.0–2.5)
ALT: 22 U/L (ref 6–29)
AST: 20 U/L (ref 10–35)
Albumin: 4.2 g/dL (ref 3.6–5.1)
Alkaline phosphatase (APISO): 88 U/L (ref 33–130)
BUN: 14 mg/dL (ref 7–25)
CO2: 29 mmol/L (ref 20–32)
Calcium: 9.2 mg/dL (ref 8.6–10.4)
Chloride: 105 mmol/L (ref 98–110)
Creat: 0.62 mg/dL (ref 0.60–0.93)
GFR, Est African American: 102 mL/min/{1.73_m2} (ref >=60)
GFR, Est Non African American: 88 mL/min/{1.73_m2} (ref >=60)
Globulin: 3.1 g/dL (calc) (ref 1.9–3.7)
Glucose, Bld: 112 mg/dL — ABNORMAL HIGH (ref 65–99)
Potassium: 3.7 mmol/L (ref 3.5–5.3)
Sodium: 142 mmol/L (ref 135–146)
Total Bilirubin: 0.6 mg/dL (ref 0.2–1.2)
Total Protein: 7.3 g/dL (ref 6.1–8.1)

## 2018-05-31 LAB — LIPID PANEL
Cholesterol: 172 mg/dL (ref <200)
HDL: 60 mg/dL (ref >50)
LDL Cholesterol (Calc): 87 mg/dL (calc)
Non-HDL Cholesterol (Calc): 112 mg/dL (calc) (ref <130)
Total CHOL/HDL Ratio: 2.9 (calc) (ref <5.0)
Triglycerides: 146 mg/dL (ref <150)

## 2018-06-02 ENCOUNTER — Other Ambulatory Visit: Payer: Self-pay | Admitting: Family Medicine

## 2018-06-02 MED ORDER — ATORVASTATIN CALCIUM 80 MG PO TABS: 80.0000 mg | ORAL_TABLET | Freq: Every day | ORAL | 1 refills | Status: DC

## 2018-06-26 ENCOUNTER — Other Ambulatory Visit: Payer: Self-pay | Admitting: Family Medicine

## 2018-06-26 DIAGNOSIS — E785 Hyperlipidemia, unspecified: Secondary | ICD-10-CM

## 2018-07-23 ENCOUNTER — Other Ambulatory Visit: Payer: Self-pay | Admitting: Family Medicine

## 2018-07-23 DIAGNOSIS — I10 Essential (primary) hypertension: Secondary | ICD-10-CM

## 2018-07-24 ENCOUNTER — Other Ambulatory Visit: Payer: Self-pay | Admitting: Family Medicine

## 2018-07-24 DIAGNOSIS — I10 Essential (primary) hypertension: Secondary | ICD-10-CM

## 2018-07-24 NOTE — Telephone Encounter (Signed)
Copied from [place] [id number]. Topic: Quick Communication - Rx Refill/Question >> Jul 24, 2018 10:05 AM Leward Quan A wrote: Medication: telmisartan-hydrochlorothiazide (MICARDIS HCT) 40-12.5 MG tablet   Has the patient contacted their pharmacy? Yes.   (Agent: If no, request that the patient contact the pharmacy for the refill.) (Agent: If yes, when and what did the pharmacy advise?)  Preferred Pharmacy (with phone number or street name): [id number] - [place], [place] [address] [phone] (Phone) [phone] (Fax)    Agent: Please be advised that RX refills may take up to 3 business days. We ask that you follow-up with your pharmacy.

## 2018-07-25 MED ORDER — TELMISARTAN-HCTZ 40-12.5 MG PO TABS: 1.0000 | ORAL_TABLET | Freq: Every day | ORAL | 0 refills | Status: DC

## 2018-09-27 ENCOUNTER — Encounter: Payer: Self-pay | Admitting: Family Medicine

## 2018-09-27 ENCOUNTER — Other Ambulatory Visit: Payer: Self-pay

## 2018-09-27 ENCOUNTER — Ambulatory Visit (INDEPENDENT_AMBULATORY_CARE_PROVIDER_SITE_OTHER): Payer: Medicare Other | Admitting: Family Medicine

## 2018-09-27 ENCOUNTER — Other Ambulatory Visit: Payer: Self-pay | Admitting: Family Medicine

## 2018-09-27 VITALS — BP 159/92 | HR 70 | Wt 186.0 lb

## 2018-09-27 DIAGNOSIS — E1169 Type 2 diabetes mellitus with other specified complication: Secondary | ICD-10-CM

## 2018-09-27 DIAGNOSIS — E785 Hyperlipidemia, unspecified: Secondary | ICD-10-CM | POA: Diagnosis not present

## 2018-09-27 DIAGNOSIS — N760 Acute vaginitis: Secondary | ICD-10-CM

## 2018-09-27 DIAGNOSIS — I10 Essential (primary) hypertension: Secondary | ICD-10-CM

## 2018-09-27 DIAGNOSIS — Z1231 Encounter for screening mammogram for malignant neoplasm of breast: Secondary | ICD-10-CM

## 2018-09-27 DIAGNOSIS — E1159 Type 2 diabetes mellitus with other circulatory complications: Secondary | ICD-10-CM

## 2018-09-27 MED ORDER — FLUCONAZOLE 150 MG PO TABS: 150.0000 mg | ORAL_TABLET | ORAL | 1 refills | Status: DC

## 2018-09-27 MED ORDER — TELMISARTAN-HCTZ 40-12.5 MG PO TABS: 1.0000 | ORAL_TABLET | Freq: Every day | ORAL | 0 refills | Status: DC

## 2018-09-27 MED ORDER — ATORVASTATIN CALCIUM 80 MG PO TABS: 80.0000 mg | ORAL_TABLET | Freq: Every day | ORAL | 1 refills | Status: DC

## 2018-09-27 MED ORDER — EMPAGLIFLOZIN-METFORMIN HCL ER 12.5-1000 MG PO TB24: 1.0000 | ORAL_TABLET | Freq: Every day | ORAL | 1 refills | Status: DC

## 2018-09-27 NOTE — Progress Notes (Signed)
Name: Adrienne Anderson   MRN: [id number]    DOB: 1940/09/11   Date:09/27/2018       Progress Note  Subjective  Chief Complaint  Chief Complaint  Patient presents with  . Follow-up  . Vaginal Itching  . Diabetes    BG:107  . Hyperlipidemia  . Hypertension    I connected with  [place]  on 09/27/18 at  8:20 AM EDT by a video enabled telemedicine application and verified that I am speaking with the correct person using two identifiers.  I discussed the limitations of evaluation and management by telemedicine and the availability of in person appointments. The patient expressed understanding and agreed to proceed. Staff also discussed with the patient that there may be a patient responsible charge related to this service. Patient Location: at home Provider Location: [facility]   HPI  DMII : she is now on [place]  she denies side effects of medication, she checks glucose occasionally and has been in the low 100's., this am it was 107 HerA1C in 12/2017was 7.3%, went down to 6.8% up again to 7.5%, 7.6%,  6.3%last visit  6.5% She denies polyphagia, polydipsia or polyuria, only has nocturia about once per night . Eye exam is due now  she has glaucoma and cataract, but is stillstable. She is only on Xalatan now. On 81 mg aspirin, statin therapy, on an ARB. She has been compliant with her diet , husband is not home ( at a rehab facility) not cooking daily but still eating protein, vegetables.   Vaginitis: she noticed vaginal itching, she is taking SGL-2 agonist and explained that is a common side effect, we will send diflucan to her pharmacy She states comes and goes, sometimes better with monistat.   HTN:she states she got confused about micardis hctz, she thought she was supposed to stop takign it when she started the [place]. She denies chest pain or palpitation. No dizziness. BP today at home was elevated, we will resume micardis hctz   Hyperlipidemia: taking  Atorvastatin, denies myalgiaat goal.No chest pain. . Reviewed last labs with patient    Patient Active Problem List   Diagnosis Date Noted  . Sensorineural hearing loss of combined sites, bilateral 11/19/2015  . Glaucoma 08/24/2014  . Cataract 08/24/2014  . Dyslipidemia associated with type 2 diabetes mellitus ([place]) 08/24/2014  . Benign hypertension 08/24/2014  . Dyslipidemia 08/24/2014    Past Surgical History:  Procedure Laterality Date  . BREAST BIOPSY Bilateral    rt/clip-02/27/03, left - all neg  . BREAST LUMPECTOMY Bilateral 1982   neg  . VAGINAL HYSTERECTOMY  1986    Family History  Problem Relation Age of Onset  . Depression Mother   . Rheum arthritis Father   . Hypertension Sister   . Breast cancer Sister   . Alcohol abuse Brother   . Hypertension Sister   . Heart disease Sister   . Cancer Sister        lung  . Hypertension Sister   . Kidney disease Sister   . Breast cancer Cousin        pat cousin  . Hypertension Sister   . Breast cancer Paternal Aunt     Social History   Socioeconomic History  . Marital status: Married    Spouse name: Leane Para  . Number of children: 2  . Years of education: Not on file  . Highest education level: Bachelor's degree (e.g., BA, AB, BS)  Occupational History  .  Occupation: Retired  Scientific laboratory technician  . Financial resource strain: Not hard at all  . Food insecurity:    Worry: Never true    Inability: Never true  . Transportation needs:    Medical: No    Non-medical: No  Tobacco Use  . Smoking status: Never Smoker  . Smokeless tobacco: Never Used  . Tobacco comment: smoking cessation materials not required  Substance and Sexual Activity  . Alcohol use: No    Alcohol/week: 0.0 standard drinks  . Drug use: No  . Sexual activity: Not Currently  Lifestyle  . Physical activity:    Days per week: 0 days    Minutes per session: 0 min  . Stress: Not at all  Relationships  . Social connections:    Talks on phone: More  than three times a week    Gets together: Once a week    Attends religious service: More than 4 times per year    Active member of club or organization: Yes    Attends meetings of clubs or organizations: More than 4 times per year    Relationship status: Married  . Intimate partner violence:    Fear of current or ex partner: No    Emotionally abused: No    Physically abused: No    Forced sexual activity: No  Other Topics Concern  . Not on file  Social History Narrative   Husband is now on dialysis     Current Outpatient Medications:  .  aspirin 81 MG tablet, Take by mouth., Disp: , Rfl:  .  atorvastatin (LIPITOR) 80 MG tablet, Take 1 tablet (80 mg total) by mouth daily., Disp: 90 tablet, Rfl: 1 .  brimonidine (ALPHAGAN) 0.15 % ophthalmic solution, Place 1 drop into both eyes 2 (two) times daily. x30 days, Disp: , Rfl: 5 .  Empagliflozin-metFORMIN HCl ER (SYNJARDY XR) 12.09-998 MG TB24, Take 1 tablet by mouth daily., Disp: 90 tablet, Rfl: 1 .  latanoprost (XALATAN) 0.005 % ophthalmic solution, Apply to eye., Disp: , Rfl:  .  MULTIPLE VITAMIN PO, Take by mouth., Disp: , Rfl:  .  glucose blood (ONE TOUCH ULTRA TEST) test strip, Use as instructed, Disp: 100 each, Rfl: 12 .  telmisartan-hydrochlorothiazide (MICARDIS HCT) 40-12.5 MG tablet, Take 1 tablet by mouth daily. (Patient not taking: Reported on 09/27/2018), Disp: 90 tablet, Rfl: 0  Allergies  Allergen Reactions  . Clindamycin/Lincomycin Nausea Only    I personally reviewed active problem list, medication list, allergies, family history, social history with the patient/caregiver today.   ROS  Ten systems reviewed and is negative except as mentioned in HPI   Objective  Virtual encounter, vitals sign obtained at home.  Body mass index is 26.69 kg/m.  Physical Exam   Awake, alert and oriented in no distress and well groomed  PHQ2/9: Depression screen [facility] 2/9 09/27/2018 05/29/2018 01/12/2018 12/20/2017 05/12/2017  Decreased  Interest 0 0 0 0 0  Down, Depressed, Hopeless 0 0 0 0 0  PHQ - 2 Score 0 0 0 0 0  Altered sleeping 0 0 0 0 -  Tired, decreased energy 0 1 2 0 -  Change in appetite 0 0 0 0 -  Feeling bad or failure about yourself  0 0 0 0 -  Trouble concentrating 0 0 0 0 -  Moving slowly or fidgety/restless 0 0 0 0 -  Suicidal thoughts 0 0 0 0 -  PHQ-9 Score 0 1 2 0 -  Difficult doing work/chores Not  difficult at all Not difficult at all Not difficult at all Not difficult at all -   PHQ-2/9 Result is negative.    Fall Risk: Fall Risk  09/27/2018 05/29/2018 01/12/2018 12/20/2017 08/24/2017  Falls in the past year? 0 0 No Yes Yes  Comment - - - trip over curb -  Number falls in past yr: 0 0 - 1 1  Injury with Fall? 0 0 - No No  Comment - - - - -  Risk for fall due to : - - - Impaired vision -  Risk for fall due to: Comment - - - wears eyeglasses; glaucoma; B cataracts -  Follow up - - - Falls evaluation completed;Education provided;Falls prevention discussed -     Assessment & Plan  1. Dyslipidemia associated with type 2 diabetes mellitus ([facility])  - Empagliflozin-metFORMIN HCl ER (SYNJARDY XR) 12.09-998 MG TB24; Take 1 tablet by mouth daily.  Dispense: 90 tablet; Refill: 1 - atorvastatin (LIPITOR) 80 MG tablet; Take 1 tablet (80 mg total) by mouth daily.  Dispense: 90 tablet; Refill: 1  2. Acute vaginitis  - fluconazole (DIFLUCAN) 150 MG tablet; Take 1 tablet (150 mg total) by mouth every other day.  Dispense: 3 tablet; Refill: 1  3. Dyslipidemia  - atorvastatin (LIPITOR) 80 MG tablet; Take 1 tablet (80 mg total) by mouth daily.  Dispense: 90 tablet; Refill: 1  4. Benign hypertension  - telmisartan-hydrochlorothiazide (MICARDIS HCT) 40-12.5 MG tablet; Take 1 tablet by mouth daily.  Dispense: 90 tablet; Refill: 0  5. Hypertension associated with diabetes ([place])  - telmisartan-hydrochlorothiazide (MICARDIS HCT) 40-12.5 MG tablet; Take 1 tablet by mouth daily.  Dispense: 90 tablet; Refill: 0  I  discussed the assessment and treatment plan with the patient. The patient was provided an opportunity to ask questions and all were answered. The patient agreed with the plan and demonstrated an understanding of the instructions.  The patient was advised to call back or seek an in-person evaluation if the symptoms worsen or if the condition fails to improve as anticipated.  I provided 25 minutes of non-face-to-face time during this encounter.

## 2018-10-10 ENCOUNTER — Telehealth: Payer: Self-pay

## 2018-10-10 NOTE — Telephone Encounter (Signed)
Patient wife was notified she does not qualify for testing nor does her husband with no symptoms.

## 2018-10-10 NOTE — Telephone Encounter (Signed)
Copied from [place] [id number]. Topic: General - Inquiry >> Oct 09, 2018 10:05 AM Jeri Cos wrote: Reason for CRM: Pt husband has been in [facility]) and is scheduled to be released home soon. Pt would like to know if she should be tested for COVID-19 before she brings her husband home. The living facility has not had any reports of COVID nor has the pt experienced any signs or symptoms of COVID.

## 2018-11-22 ENCOUNTER — Ambulatory Visit
Admission: RE | Admit: 2018-11-22 | Discharge: 2018-11-22 | Disposition: A | Discharge status: Home / Self Care | Payer: Medicare Other | Source: Ambulatory Visit | Attending: Family Medicine | Admitting: Family Medicine

## 2018-11-22 ENCOUNTER — Other Ambulatory Visit: Payer: Self-pay

## 2018-11-22 ENCOUNTER — Encounter (INDEPENDENT_AMBULATORY_CARE_PROVIDER_SITE_OTHER): Payer: Self-pay

## 2018-11-22 DIAGNOSIS — Z1231 Encounter for screening mammogram for malignant neoplasm of breast: Secondary | ICD-10-CM

## 2018-12-04 ENCOUNTER — Encounter: Payer: Self-pay | Admitting: Family Medicine

## 2018-12-04 LAB — HM DIABETES EYE EXAM

## 2018-12-20 ENCOUNTER — Other Ambulatory Visit: Payer: Self-pay | Admitting: Family Medicine

## 2018-12-20 DIAGNOSIS — I10 Essential (primary) hypertension: Secondary | ICD-10-CM

## 2018-12-20 DIAGNOSIS — E1159 Type 2 diabetes mellitus with other circulatory complications: Secondary | ICD-10-CM

## 2018-12-20 NOTE — Telephone Encounter (Signed)
Refill request for general medication. Micardis   Last office visit 09/27/2018   Follow up on 01/02/2019

## 2018-12-22 ENCOUNTER — Ambulatory Visit (INDEPENDENT_AMBULATORY_CARE_PROVIDER_SITE_OTHER): Payer: Medicare Other

## 2018-12-22 VITALS — BP 122/74 | HR 77 | Ht 70.0 in | Wt 188.0 lb

## 2018-12-22 DIAGNOSIS — Z0000 Encounter for general adult medical examination without abnormal findings: Secondary | ICD-10-CM | POA: Diagnosis not present

## 2018-12-22 NOTE — Patient Instructions (Signed)
Adrienne Anderson , Thank you for taking time to come for your Medicare Wellness Visit. I appreciate your ongoing commitment to your health goals. Please review the following plan we discussed and let me know if I can assist you in the future.   Screening recommendations/referrals: Colonoscopy: Cologuard completed 02/10/18. Repeat in 2022 Mammogram: done 11/22/18 Bone Density: done 02/13/18 Recommended yearly ophthalmology/optometry visit for glaucoma screening and checkup Recommended yearly dental visit for hygiene and checkup  Vaccinations: Influenza vaccine: done 02/02/18 Pneumococcal vaccine: done 10/30/14 Tdap vaccine: done 08/03/12 Shingles vaccine: done 04/04/18    Advanced directives: Please bring a copy of your health care power of attorney and living will to the office at your convenience.  Conditions/risks identified: recommend drinking 6-8 glasses of water per day  Next appointment: Please follow up in one year for your Medicare Annual Wellness visit.     Preventive Care 11 Years and Older, Female Preventive care refers to lifestyle choices and visits with your health care provider that can promote health and wellness. What does preventive care include?  A yearly physical exam. This is also called an annual well check.  Dental exams once or twice a year.  Routine eye exams. Ask your health care provider how often you should have your eyes checked.  Personal lifestyle choices, including:  Daily care of your teeth and gums.  Regular physical activity.  Eating a healthy diet.  Avoiding tobacco and drug use.  Limiting alcohol use.  Practicing safe sex.  Taking low-dose aspirin every day.  Taking vitamin and mineral supplements as recommended by your health care provider. What happens during an annual well check? The services and screenings done by your health care provider during your annual well check will depend on your age, overall health, lifestyle risk factors, and  family history of disease. Counseling  Your health care provider may ask you questions about your:  Alcohol use.  Tobacco use.  Drug use.  Emotional well-being.  Home and relationship well-being.  Sexual activity.  Eating habits.  History of falls.  Memory and ability to understand (cognition).  Work and work Statistician.  Reproductive health. Screening  You may have the following tests or measurements:  Height, weight, and BMI.  Blood pressure.  Lipid and cholesterol levels. These may be checked every 5 years, or more frequently if you are over 56 years old.  Skin check.  Lung cancer screening. You may have this screening every year starting at age 17 if you have a 30-pack-year history of smoking and currently smoke or have quit within the past 15 years.  Fecal occult blood test (FOBT) of the stool. You may have this test every year starting at age 26.  Flexible sigmoidoscopy or colonoscopy. You may have a sigmoidoscopy every 5 years or a colonoscopy every 10 years starting at age 69.  Hepatitis C blood test.  Hepatitis B blood test.  Sexually transmitted disease (STD) testing.  Diabetes screening. This is done by checking your blood sugar (glucose) after you have not eaten for a while (fasting). You may have this done every 1-3 years.  Bone density scan. This is done to screen for osteoporosis. You may have this done starting at age 70.  Mammogram. This may be done every 1-2 years. Talk to your health care provider about how often you should have regular mammograms. Talk with your health care provider about your test results, treatment options, and if necessary, the need for more tests. Vaccines  Your health care  provider may recommend certain vaccines, such as:  Influenza vaccine. This is recommended every year.  Tetanus, diphtheria, and acellular pertussis (Tdap, Td) vaccine. You may need a Td booster every 10 years.  Zoster vaccine. You may need this  after age 42.  Pneumococcal 13-valent conjugate (PCV13) vaccine. One dose is recommended after age 45.  Pneumococcal polysaccharide (PPSV23) vaccine. One dose is recommended after age 35. Talk to your health care provider about which screenings and vaccines you need and how often you need them. This information is not intended to replace advice given to you by your health care provider. Make sure you discuss any questions you have with your health care provider. Document Released: 06/06/2015 Document Revised: 01/28/2016 Document Reviewed: 03/11/2015 Elsevier Interactive Patient Education  2017 [employer] Prevention in the Home Falls can cause injuries. They can happen to people of all ages. There are many things you can do to make your home safe and to help prevent falls. What can I do on the outside of my home?  Regularly fix the edges of walkways and driveways and fix any cracks.  Remove anything that might make you trip as you walk through a door, such as a raised step or threshold.  Trim any bushes or trees on the path to your home.  Use bright outdoor lighting.  Clear any walking paths of anything that might make someone trip, such as rocks or tools.  Regularly check to see if handrails are loose or broken. Make sure that both sides of any steps have handrails.  Any raised decks and porches should have guardrails on the edges.  Have any leaves, snow, or ice cleared regularly.  Use sand or salt on walking paths during winter.  Clean up any spills in your garage right away. This includes oil or grease spills. What can I do in the bathroom?  Use night lights.  Install grab bars by the toilet and in the tub and shower. Do not use towel bars as grab bars.  Use non-skid mats or decals in the tub or shower.  If you need to sit down in the shower, use a plastic, non-slip stool.  Keep the floor dry. Clean up any water that spills on the floor as soon as it happens.   Remove soap buildup in the tub or shower regularly.  Attach bath mats securely with double-sided non-slip rug tape.  Do not have throw rugs and other things on the floor that can make you trip. What can I do in the bedroom?  Use night lights.  Make sure that you have a light by your bed that is easy to reach.  Do not use any sheets or blankets that are too big for your bed. They should not hang down onto the floor.  Have a firm chair that has side arms. You can use this for support while you get dressed.  Do not have throw rugs and other things on the floor that can make you trip. What can I do in the kitchen?  Clean up any spills right away.  Avoid walking on wet floors.  Keep items that you use a lot in easy-to-reach places.  If you need to reach something above you, use a strong step stool that has a grab bar.  Keep electrical cords out of the way.  Do not use floor polish or wax that makes floors slippery. If you must use wax, use non-skid floor wax.  Do not have throw  rugs and other things on the floor that can make you trip. What can I do with my stairs?  Do not leave any items on the stairs.  Make sure that there are handrails on both sides of the stairs and use them. Fix handrails that are broken or loose. Make sure that handrails are as long as the stairways.  Check any carpeting to make sure that it is firmly attached to the stairs. Fix any carpet that is loose or worn.  Avoid having throw rugs at the top or bottom of the stairs. If you do have throw rugs, attach them to the floor with carpet tape.  Make sure that you have a light switch at the top of the stairs and the bottom of the stairs. If you do not have them, ask someone to add them for you. What else can I do to help prevent falls?  Wear shoes that:  Do not have high heels.  Have rubber bottoms.  Are comfortable and fit you well.  Are closed at the toe. Do not wear sandals.  If you use a  stepladder:  Make sure that it is fully opened. Do not climb a closed stepladder.  Make sure that both sides of the stepladder are locked into place.  Ask someone to hold it for you, if possible.  Clearly mark and make sure that you can see:  Any grab bars or handrails.  First and last steps.  Where the edge of each step is.  Use tools that help you move around (mobility aids) if they are needed. These include:  Canes.  Walkers.  Scooters.  Crutches.  Turn on the lights when you go into a dark area. Replace any light bulbs as soon as they burn out.  Set up your furniture so you have a clear path. Avoid moving your furniture around.  If any of your floors are uneven, fix them.  If there are any pets around you, be aware of where they are.  Review your medicines with your doctor. Some medicines can make you feel dizzy. This can increase your chance of falling. Ask your doctor what other things that you can do to help prevent falls. This information is not intended to replace advice given to you by your health care provider. Make sure you discuss any questions you have with your health care provider. Document Released: 03/06/2009 Document Revised: 10/16/2015 Document Reviewed: 06/14/2014 Elsevier Interactive Patient Education  2017 [employer].

## 2018-12-22 NOTE — Progress Notes (Signed)
Subjective:   Adrienne Anderson is a 78 y.o. female who presents for Medicare Annual (Subsequent) preventive examination.  Virtual Visit via Telephone Note  I connected with MADALIN HUGHART on 12/22/18 at 10:40 AM EDT by telephone and verified that I am speaking with the correct person using two identifiers.  Medicare Annual Wellness visit completed telephonically due to Covid-19 pandemic.   Location: Patient: home Provider: office   I discussed the limitations, risks, security and privacy concerns of performing an evaluation and management service by telephone and the availability of in person appointments. The patient expressed understanding and agreed to proceed.  Some vital signs may be absent or patient reported.     Clemetine Marker, LPN   Review of Systems:   Cardiac Risk Factors include: advanced age (>46men, >20 women);diabetes mellitus;dyslipidemia;hypertension     Objective:     Vitals: BP 122/74   Pulse 77   Ht 5\' 10"  (1.778 m)   Wt 188 lb (85.3 kg)   BMI 26.98 kg/m   Body mass index is 26.98 kg/m.  Advanced Directives 12/22/2018 12/20/2017 11/09/2016 11/02/2016 05/04/2016 11/03/2015 09/08/2015  Does Patient Have a Medical Advance Directive? Yes Yes Yes Yes Yes Yes Yes  Type of Paramedic of [place];Living will Living will;Healthcare Power of [place];Living will [place];Living will [place];Living will [place];Living will [place];Living will  Does patient want to make changes to medical advance directive? No - Patient declined - - - - - No - Patient declined  Copy of [place] in Chart? No - copy requested No - copy requested - No - copy requested No - copy requested No - copy requested No - copy requested  Would patient like information on creating a medical advance directive? - - - - - - -    Tobacco Social  History   Tobacco Use  Smoking Status Never Smoker  Smokeless Tobacco Never Used  Tobacco Comment   smoking cessation materials not required     Counseling given: Not Answered Comment: smoking cessation materials not required   Clinical Intake:  Pre-visit preparation completed: Yes  Pain : No/denies pain     BMI - recorded: 26.98 Nutritional Status: BMI 25 -29 Overweight Nutritional Risks: None Diabetes: Yes CBG done?: No Did pt. bring in CBG monitor from home?: No   Nutrition Risk Assessment:  Has the patient had any N/V/D within the last 2 months?  No  Does the patient have any non-healing wounds?  No  Has the patient had any unintentional weight loss or weight gain?  No   Diabetes:  Is the patient diabetic?  Yes  If diabetic, was a CBG obtained today?  No  Did the patient bring in their glucometer from home?  No  How often do you monitor your CBG's? Pt checks a few times per week.   Financial Strains and Diabetes Management:  Are you having any financial strains with the device, your supplies or your medication? No .  Does the patient want to be seen by Chronic Care Management for management of their diabetes?  No  Would the patient like to be referred to a Nutritionist or for Diabetic Management?  No   Diabetic Exams:  Diabetic Eye Exam: Completed 12/04/18 negative retinopathy.   Diabetic Foot Exam: Completed 01/12/18.   How often do you need to have someone help you when you read instructions,  pamphlets, or other written materials from your doctor or pharmacy?: 1 - Never  Interpreter Needed?: No  Information entered by :: Clemetine Marker LPN  Past Medical History:  Diagnosis Date  . Cataract   . Diabetes mellitus without complication ([place])   . Glaucoma   . Hyperlipidemia   . Hypertension   . Obesity    Past Surgical History:  Procedure Laterality Date  . BREAST BIOPSY Bilateral    rt/clip-02/27/03, left - all neg  . BREAST LUMPECTOMY Bilateral  1982   neg  . VAGINAL HYSTERECTOMY  1986   Family History  Problem Relation Age of Onset  . Depression Mother   . Rheum arthritis Father   . Hypertension Sister   . Breast cancer Sister   . Alcohol abuse Brother   . Hypertension Sister   . Heart disease Sister   . Cancer Sister        lung  . Hypertension Sister   . Kidney disease Sister   . Breast cancer Cousin        pat cousin  . Hypertension Sister   . Breast cancer Paternal Aunt    Social History   Socioeconomic History  . Marital status: Married    Spouse name: Leane Para  . Number of children: 2  . Years of education: Not on file  . Highest education level: Bachelor's degree (e.g., BA, AB, BS)  Occupational History  . Occupation: Retired  Scientific laboratory technician  . Financial resource strain: Not hard at all  . Food insecurity    Worry: Never true    Inability: Never true  . Transportation needs    Medical: No    Non-medical: No  Tobacco Use  . Smoking status: Never Smoker  . Smokeless tobacco: Never Used  . Tobacco comment: smoking cessation materials not required  Substance and Sexual Activity  . Alcohol use: No    Alcohol/week: 0.0 standard drinks  . Drug use: No  . Sexual activity: Not Currently  Lifestyle  . Physical activity    Days per week: 0 days    Minutes per session: 0 min  . Stress: Not at all  Relationships  . Social connections    Talks on phone: More than three times a week    Gets together: Once a week    Attends religious service: More than 4 times per year    Active member of club or organization: Yes    Attends meetings of clubs or organizations: More than 4 times per year    Relationship status: Married  Other Topics Concern  . Not on file  Social History Narrative   Husband is now on dialysis    Outpatient Encounter Medications as of 12/22/2018  Medication Sig  . aspirin 81 MG tablet Take by mouth.  Marland Kitchen atorvastatin (LIPITOR) 80 MG tablet Take 1 tablet (80 mg total) by mouth daily.  .  brimonidine (ALPHAGAN) 0.15 % ophthalmic solution Place 1 drop into both eyes 2 (two) times daily. x30 days  . Empagliflozin-metFORMIN HCl ER (SYNJARDY XR) 12.09-998 MG TB24 Take 1 tablet by mouth daily.  Marland Kitchen glucose blood (ONE TOUCH ULTRA TEST) test strip Use as instructed  . latanoprost (XALATAN) 0.005 % ophthalmic solution Apply to eye.  . MULTIPLE VITAMIN PO Take by mouth.  . telmisartan-hydrochlorothiazide (MICARDIS HCT) 40-12.5 MG tablet TAKE 1 TABLET BY MOUTH EVERY DAY  . [DISCONTINUED] fluconazole (DIFLUCAN) 150 MG tablet Take 1 tablet (150 mg total) by mouth every other day.  No facility-administered encounter medications on file as of 12/22/2018.     Activities of Daily Living In your present state of health, do you have any difficulty performing the following activities: 12/22/2018 09/27/2018  Hearing? N N  Comment declines hearing aids -  Vision? N Y  Comment wears glasses -  Difficulty concentrating or making decisions? N N  Walking or climbing stairs? N N  Dressing or bathing? N N  Doing errands, shopping? N N  Preparing Food and eating ? N -  Using the Toilet? N -  In the past six months, have you accidently leaked urine? Y -  Comment wears pads for protection -  Do you have problems with loss of bowel control? N -  Managing your Medications? N -  Managing your Finances? N -  Housekeeping or managing your Housekeeping? N -  Some recent data might be hidden    Patient Care Team: Steele Sizer, MD as PCP - General (Family Medicine) Birder Robson, MD as Consulting Physician (Ophthalmology)    Assessment:   This is a routine wellness examination for Jizel.  Exercise Activities and Dietary recommendations Current Exercise Habits: The patient does not participate in regular exercise at present, Exercise limited by: None identified  Goals    . DIET - INCREASE WATER INTAKE     Recommend to drink at least 6-8 8oz glasses of water per day.       Fall Risk  Fall Risk  12/22/2018 09/27/2018 05/29/2018 01/12/2018 12/20/2017  Falls in the past year? 0 0 0 No Yes  Comment - - - - trip over curb  Number falls in past yr: 0 0 0 - 1  Injury with Fall? 0 0 0 - No  Comment - - - - -  Risk for fall due to : - - - - Impaired vision  Risk for fall due to: Comment - - - - wears eyeglasses; glaucoma; B cataracts  Follow up Falls prevention discussed - - - Falls evaluation completed;Education provided;Falls prevention discussed   FALL RISK PREVENTION PERTAINING TO THE HOME:  Any stairs in or around the home? Yes  If so, do they handrails? Yes   Home free of loose throw rugs in walkways, pet beds, electrical cords, etc? Yes  Adequate lighting in your home to reduce risk of falls? Yes   ASSISTIVE DEVICES UTILIZED TO PREVENT FALLS:  Life alert? No  Use of a cane, walker or w/c? No  Grab bars in the bathroom? No  Shower chair or bench in shower? Yes  Elevated toilet seat or a handicapped toilet? Yes   DME ORDERS:  DME order needed?  No   TIMED UP AND GO:  Was the test performed? No . Telephonic visit.   Education: Fall risk prevention has been discussed.  Intervention(s) required? No   Depression Screen PHQ 2/9 Scores 12/22/2018 09/27/2018 05/29/2018 01/12/2018  PHQ - 2 Score 0 0 0 0  PHQ- 9 Score - 0 1 2     Cognitive Function     6CIT Screen 12/22/2018 12/20/2017  What Year? 0 points 0 points  What month? 0 points 0 points  What time? 0 points 0 points  Count back from 20 0 points 0 points  Months in reverse 0 points 0 points  Repeat phrase 0 points 2 points  Total Score 0 2    Immunization History  Administered Date(s) Administered  . Influenza Split 03/25/2014  . Influenza, High Dose Seasonal PF 03/10/2016, 03/28/2017, 02/02/2018  .  Influenza, Seasonal, Injecte, Preservative Fre 05/03/2012  . Influenza,inj,Quad PF,6+ Mos 03/06/2015  . Pneumococcal Conjugate-13 10/30/2014  . Pneumococcal Polysaccharide-23 05/03/2012  . Tdap 08/03/2012   . Zoster 11/06/2012  . Zoster Recombinat (Shingrix) 02/02/2018, 04/04/2018    Qualifies for Shingles Vaccine? Yes  Zostavax completed 2014. Shingrix series completed 2019.  Tdap: Up to date  Flu Vaccine: Up to date  Pneumococcal Vaccine: Up to date    Screening Tests Health Maintenance  Topic Date Due  . HEMOGLOBIN A1C  11/27/2018  . INFLUENZA VACCINE  12/23/2018  . FOOT EXAM  01/13/2019  . MAMMOGRAM  11/22/2019  . OPHTHALMOLOGY EXAM  12/04/2019  . Fecal DNA (Cologuard)  02/10/2021  . TETANUS/TDAP  08/04/2022  . DEXA SCAN  Completed  . PNA vac Low Risk Adult  Completed   Cancer Screenings:  Colorectal Screening: Cologuard completed 02/10/18. Repeat every 3 years;   Mammogram: Completed 11/22/18. Repeat every year   Bone Density: Completed 02/13/18. Results reflect NORMAL. Repeat every 2 years.    Lung Cancer Screening: (Low Dose CT Chest recommended if Age 54-80 years, 30 pack-year currently smoking OR have quit w/in 15years.) does not qualify.     Additional Screening:  Hepatitis C Screening: no longer required.   Vision Screening: Recommended annual ophthalmology exams for early detection of glaucoma and other disorders of the eye. Is the patient up to date with their annual eye exam?  Yes  Who is the provider or what is the name of the office in which the pt attends annual eye exams? [place] Screening: Recommended annual dental exams for proper oral hygiene  Community Resource Referral:  CRR required this visit?  No      Plan:     I have personally reviewed and addressed the Medicare Annual Wellness questionnaire and have noted the following in the patient's chart:  A. Medical and social history B. Use of alcohol, tobacco or illicit drugs  C. Current medications and supplements D. Functional ability and status E.  Nutritional status F.  Physical activity G. Advance directives H. List of other physicians I.  Hospitalizations,  surgeries, and ER visits in previous 12 months J.  [place] such as hearing and vision if needed, cognitive and depression L. Referrals and appointments   In addition, I have reviewed and discussed with patient certain preventive protocols, quality metrics, and best practice recommendations. A written personalized care plan for preventive services as well as general preventive health recommendations were provided to patient.   Signed,  Clemetine Marker, LPN Nurse Health Advisor   Nurse Notes: pt doing well and appreciative of visit today

## 2019-01-02 ENCOUNTER — Ambulatory Visit: Payer: Medicare Other | Admitting: Family Medicine

## 2019-01-24 ENCOUNTER — Ambulatory Visit: Payer: Medicare Other | Admitting: Family Medicine

## 2019-01-24 ENCOUNTER — Encounter: Payer: Self-pay | Admitting: Family Medicine

## 2019-01-24 ENCOUNTER — Other Ambulatory Visit: Payer: Self-pay

## 2019-01-24 VITALS — BP 110/80 | HR 64 | Temp 96.9°F | Resp 16 | Ht 70.0 in | Wt 182.9 lb

## 2019-01-24 DIAGNOSIS — Z23 Encounter for immunization: Secondary | ICD-10-CM

## 2019-01-24 DIAGNOSIS — E785 Hyperlipidemia, unspecified: Secondary | ICD-10-CM | POA: Diagnosis not present

## 2019-01-24 DIAGNOSIS — E1159 Type 2 diabetes mellitus with other circulatory complications: Secondary | ICD-10-CM | POA: Diagnosis not present

## 2019-01-24 DIAGNOSIS — E1169 Type 2 diabetes mellitus with other specified complication: Secondary | ICD-10-CM | POA: Diagnosis not present

## 2019-01-24 DIAGNOSIS — I10 Essential (primary) hypertension: Secondary | ICD-10-CM

## 2019-01-24 LAB — POCT UA - MICROALBUMIN: Microalbumin Ur, POC: NEGATIVE mg/L

## 2019-01-24 LAB — POCT GLYCOSYLATED HEMOGLOBIN (HGB A1C): Hemoglobin A1C: 6.5 % — AB (ref 4.0–5.6)

## 2019-01-24 MED ORDER — ATORVASTATIN CALCIUM 80 MG PO TABS: 80.0000 mg | ORAL_TABLET | Freq: Every day | ORAL | 1 refills | Status: DC

## 2019-01-24 MED ORDER — TELMISARTAN-HCTZ 40-12.5 MG PO TABS: 1.0000 | ORAL_TABLET | Freq: Every day | ORAL | 1 refills | Status: DC

## 2019-01-24 MED ORDER — SYNJARDY XR 12.5-1000 MG PO TB24: 1.0000 | ORAL_TABLET | Freq: Every day | ORAL | 1 refills | Status: DC

## 2019-01-24 NOTE — Progress Notes (Signed)
Name: Adrienne Anderson   MRN: [id number]    DOB: 18-Oct-1940   Date:01/24/2019       Progress Note  Subjective  Chief Complaint  Chief Complaint  Patient presents with  . Medication Refill  . Diabetes  . Dyslipidemia  . Hyperlipidemia  . Hypertension    HPI  DMII : sheis now on Sanjardy - no recent episodes of yeast infections. Glucose 99-00HerA1C in 12/2017was 7.3%, past couple visit down to 6.5% She denies polyphagia, polydipsia or polyuria, only has nocturia about once per night. She has glaucoma and cataract, but is stillstable. On 81 mg aspirin, statin therapy, on an ARB. She has been compliant with her diet, she is a widow now and eating TV dinners, sometimes goes out to eat   HTN:she states she got confused about micardis hctz, she thought she was supposed to stop takign it when she started the [place]. She denies chest pain or palpitation. No dizziness.BP today at home was elevated, we will resume micardis hctz   Hyperlipidemia: taking Atorvastatin, denies myalgiaat goal.No chest pain.    Patient Active Problem List   Diagnosis Date Noted  . Sensorineural hearing loss of combined sites, bilateral 11/19/2015  . Glaucoma 08/24/2014  . Cataract 08/24/2014  . Dyslipidemia associated with type 2 diabetes mellitus ([place]) 08/24/2014  . Benign hypertension 08/24/2014  . Dyslipidemia 08/24/2014    Past Surgical History:  Procedure Laterality Date  . BREAST BIOPSY Bilateral    rt/clip-02/27/03, left - all neg  . BREAST LUMPECTOMY Bilateral 1982   neg  . VAGINAL HYSTERECTOMY  1986    Family History  Problem Relation Age of Onset  . Depression Mother   . Rheum arthritis Father   . Hypertension Sister   . Breast cancer Sister   . Alcohol abuse Brother   . Hypertension Sister   . Heart disease Sister   . Cancer Sister        lung  . Hypertension Sister   . Kidney disease Sister   . Breast cancer Cousin        pat cousin  . Hypertension Sister   .  Breast cancer Paternal Aunt     Social History   Socioeconomic History  . Marital status: Widowed    Spouse name: Leane Para  . Number of children: 2  . Years of education: Not on file  . Highest education level: Bachelor's degree (e.g., BA, AB, BS)  Occupational History  . Occupation: Retired  Scientific laboratory technician  . Financial resource strain: Not hard at all  . Food insecurity    Worry: Never true    Inability: Never true  . Transportation needs    Medical: No    Non-medical: No  Tobacco Use  . Smoking status: Never Smoker  . Smokeless tobacco: Never Used  . Tobacco comment: smoking cessation materials not required  Substance and Sexual Activity  . Alcohol use: No    Alcohol/week: 0.0 standard drinks  . Drug use: No  . Sexual activity: Not Currently    Partners: Male  Lifestyle  . Physical activity    Days per week: 0 days    Minutes per session: 0 min  . Stress: Not at all  Relationships  . Social connections    Talks on phone: More than three times a week    Gets together: Once a week    Attends religious service: More than 4 times per year    Active member of club or organization: Yes  Attends meetings of clubs or organizations: More than 4 times per year    Relationship status: Married  . Intimate partner violence    Fear of current or ex partner: No    Emotionally abused: No    Physically abused: No    Forced sexual activity: No  Other Topics Concern  . Not on file  Social History Narrative   Widow since 2020, two children in town     Current Outpatient Medications:  .  aspirin 81 MG tablet, Take by mouth., Disp: , Rfl:  .  atorvastatin (LIPITOR) 80 MG tablet, Take 1 tablet (80 mg total) by mouth daily., Disp: 90 tablet, Rfl: 1 .  brimonidine (ALPHAGAN) 0.15 % ophthalmic solution, Place 1 drop into both eyes 2 (two) times daily. x30 days, Disp: , Rfl: 5 .  Empagliflozin-metFORMIN HCl ER (SYNJARDY XR) 12.09-998 MG TB24, Take 1 tablet by mouth daily., Disp: 90  tablet, Rfl: 1 .  glucose blood (ONE TOUCH ULTRA TEST) test strip, Use as instructed, Disp: 100 each, Rfl: 12 .  latanoprost (XALATAN) 0.005 % ophthalmic solution, Apply to eye., Disp: , Rfl:  .  MULTIPLE VITAMIN PO, Take by mouth., Disp: , Rfl:  .  telmisartan-hydrochlorothiazide (MICARDIS HCT) 40-12.5 MG tablet, Take 1 tablet by mouth daily., Disp: 90 tablet, Rfl: 1  Allergies  Allergen Reactions  . Clindamycin/Lincomycin Nausea Only    I personally reviewed active problem list, medication list, allergies, family history, social history with the patient/caregiver today.   ROS  Constitutional: Negative for fever or significant weight change.  Respiratory: Negative for cough and shortness of breath.   Cardiovascular: Negative for chest pain or palpitations.  Gastrointestinal: Negative for abdominal pain, no bowel changes.  Musculoskeletal: Negative for gait problem or joint swelling.  Skin: Negative for rash.  Neurological: Negative for dizziness or headache.  No other specific complaints in a complete review of systems (except as listed in HPI above).  Objective  Vitals:   01/24/19 1039  BP: 110/80  Pulse: 64  Resp: 16  Temp: (!) 96.9 F (36.1 C)  TempSrc: Temporal  SpO2: 97%  Weight: 182 lb 14.4 oz (83 kg)  Height: 5\' 10"  (1.778 m)    Body mass index is 26.24 kg/m.  Physical Exam  Constitutional: Patient appears well-developed and well-nourished. Overweight. No distress.  HEENT: head atraumatic, normocephalic, pupils equal and reactive to light Cardiovascular: Normal rate, regular rhythm and normal heart sounds.  No murmur heard. No BLE edema. Pulmonary/Chest: Effort normal and breath sounds normal. No respiratory distress. Abdominal: Soft.  There is no tenderness. Psychiatric: Patient has a normal mood and affect. behavior is normal. Judgment and thought content normal.   Recent Results (from the past 2160 hour(s))  HM DIABETES EYE EXAM     Status: None    Collection Time: 12/04/18 12:00 AM  Result Value Ref Range   HM Diabetic Eye Exam No Retinopathy No Retinopathy    Comment: [facility], Dr. George Ina  POCT HgB A1C     Status: Abnormal   Collection Time: 01/24/19 10:44 AM  Result Value Ref Range   Hemoglobin A1C 6.5 (A) 4.0 - 5.6 %   HbA1c POC (<> result, manual entry)     HbA1c, POC (prediabetic range)     HbA1c, POC (controlled diabetic range)      Diabetic Foot Exam: Diabetic Foot Exam - Simple   Simple Foot Form Diabetic Foot exam was performed with the following findings: Yes 01/24/2019 12:37 PM  Visual Inspection No deformities, no ulcerations, no other skin breakdown bilaterally: Yes Sensation Testing Intact to touch and monofilament testing bilaterally: Yes Pulse Check Posterior Tibialis and Dorsalis pulse intact bilaterally: Yes Comments      PHQ2/9: Depression screen [facility] 2/9 01/24/2019 12/22/2018 09/27/2018 05/29/2018 01/12/2018  Decreased Interest 0 0 0 0 0  Down, Depressed, Hopeless 0 0 0 0 0  PHQ - 2 Score 0 0 0 0 0  Altered sleeping 0 - 0 0 0  Tired, decreased energy 0 - 0 1 2  Change in appetite 0 - 0 0 0  Feeling bad or failure about yourself  0 - 0 0 0  Trouble concentrating 0 - 0 0 0  Moving slowly or fidgety/restless 0 - 0 0 0  Suicidal thoughts 0 - 0 0 0  PHQ-9 Score 0 - 0 1 2  Difficult doing work/chores - - Not difficult at all Not difficult at all Not difficult at all    phq 9 is negative   Fall Risk: Fall Risk  01/24/2019 01/24/2019 12/22/2018 09/27/2018 05/29/2018  Falls in the past year? 0 0 0 0 0  Comment - - - - -  Number falls in past yr: 0 0 0 0 0  Injury with Fall? 0 0 0 0 0  Comment - - - - -  Risk for fall due to : - - - - -  Risk for fall due to: Comment - - - - -  Follow up - - Falls prevention discussed - -     Functional Status Survey: Is the patient deaf or have difficulty hearing?: No Does the patient have difficulty seeing, even when wearing glasses/contacts?: No Does the  patient have difficulty concentrating, remembering, or making decisions?: No Does the patient have difficulty walking or climbing stairs?: No Does the patient have difficulty dressing or bathing?: No Does the patient have difficulty doing errands alone such as visiting a doctor's office or shopping?: No    Assessment & Plan  1. Dyslipidemia associated with type 2 diabetes mellitus (HCC)  - POCT HgB A1C - atorvastatin (LIPITOR) 80 MG tablet; Take 1 tablet (80 mg total) by mouth daily.  Dispense: 90 tablet; Refill: 1 - Empagliflozin-metFORMIN HCl ER (SYNJARDY XR) 12.09-998 MG TB24; Take 1 tablet by mouth daily.  Dispense: 90 tablet; Refill: 1 - POCT UA - Microalbumin  2. Need for immunization against influenza  - Flu Vaccine QUAD High Dose(Fluad)  3. Dyslipidemia  - atorvastatin (LIPITOR) 80 MG tablet; Take 1 tablet (80 mg total) by mouth daily.  Dispense: 90 tablet; Refill: 1  4. Hypertension associated with diabetes ([place])  - telmisartan-hydrochlorothiazide (MICARDIS HCT) 40-12.5 MG tablet; Take 1 tablet by mouth daily.  Dispense: 90 tablet; Refill: 1  5. Benign hypertension  - telmisartan-hydrochlorothiazide (MICARDIS HCT) 40-12.5 MG tablet; Take 1 tablet by mouth daily.  Dispense: 90 tablet; Refill: 1

## 2019-05-28 ENCOUNTER — Other Ambulatory Visit: Payer: Self-pay

## 2019-05-28 ENCOUNTER — Encounter: Payer: Self-pay | Admitting: Family Medicine

## 2019-05-28 ENCOUNTER — Ambulatory Visit (INDEPENDENT_AMBULATORY_CARE_PROVIDER_SITE_OTHER): Payer: Medicare PPO | Admitting: Family Medicine

## 2019-05-28 VITALS — BP 120/82 | HR 73 | Temp 97.1°F | Resp 16 | Ht 70.0 in | Wt 193.3 lb

## 2019-05-28 DIAGNOSIS — E1169 Type 2 diabetes mellitus with other specified complication: Secondary | ICD-10-CM | POA: Diagnosis not present

## 2019-05-28 DIAGNOSIS — E1159 Type 2 diabetes mellitus with other circulatory complications: Secondary | ICD-10-CM

## 2019-05-28 DIAGNOSIS — I10 Essential (primary) hypertension: Secondary | ICD-10-CM | POA: Diagnosis not present

## 2019-05-28 DIAGNOSIS — Z0000 Encounter for general adult medical examination without abnormal findings: Secondary | ICD-10-CM | POA: Diagnosis not present

## 2019-05-28 DIAGNOSIS — E785 Hyperlipidemia, unspecified: Secondary | ICD-10-CM | POA: Diagnosis not present

## 2019-05-28 NOTE — Progress Notes (Signed)
Name: Adrienne Anderson   MRN: [id number]    DOB: [birth year]   Date:05/28/2019       Progress Note  Subjective  Chief Complaint  Chief Complaint  Patient presents with  . Annual Exam  . Medication Refill  . Diabetes    Average 140's checks daily  . Hypertension    Denies any symptoms  . Hyperlipidemia    HPI  Patient presents for annual CPE.  DMII : sheis now on Sanjardy - no recent episodes of yeast infections. Glucose 145 this am, she states it has been in the 130's-140's HerA1C in 12/2017was 7.3%, past couple visit down to 6.5% She denies polyphagia, polydipsia or polyuria, only has nocturia about once per night. She has glaucoma and cataract, but is stillstable. On 81 mg aspirin, statin therapy, on an ARB. She has been compliant with her diet, she is a widow now and eating TV dinners, she is also eating more pasta   HTN:she states she got confused about micardis hctz, she thought she was supposed to stop takign it when she started the [place]. Shedenies chest pain or palpitation. No dizziness.BP today at home was elevated, we will resume micardis hctz  Hyperlipidemia: taking Atorvastatin, denies myalgiaat goal.No chest pain. We will recheck labs today     Diet: balanced  Exercise: needs to exercise more , she has a treadmill   USPSTF grade A and B recommendations    Office Visit from 05/28/2019 in [facility]  AUDIT-C Score  0     Depression: Phq 9 is  negative Depression screen [facility] 2/9 05/28/2019 01/24/2019 12/22/2018 09/27/2018 05/29/2018  Decreased Interest 0 0 0 0 0  Down, Depressed, Hopeless 0 0 0 0 0  PHQ - 2 Score 0 0 0 0 0  Altered sleeping 0 0 - 0 0  Tired, decreased energy 0 0 - 0 1  Change in appetite 0 0 - 0 0  Feeling bad or failure about yourself  0 0 - 0 0  Trouble concentrating 0 0 - 0 0  Moving slowly or fidgety/restless 0 0 - 0 0  Suicidal thoughts 0 0 - 0 0  PHQ-9 Score 0 0 - 0 1  Difficult doing work/chores Not  difficult at all - - Not difficult at all Not difficult at all   Hypertension: BP Readings from Last 3 Encounters:  05/28/19 120/82  01/24/19 110/80  12/22/18 122/74   Obesity: Wt Readings from Last 3 Encounters:  05/28/19 193 lb 4.8 oz (87.7 kg)  01/24/19 182 lb 14.4 oz (83 kg)  12/22/18 188 lb (85.3 kg)   BMI Readings from Last 3 Encounters:  05/28/19 27.74 kg/m  01/24/19 26.24 kg/m  12/22/18 26.98 kg/m     Hep C Screening: today  STD testing and prevention (HIV/chl/gon/syphilis): N/A Intimate partner violence:widow, no abuse from children  Sexual History (Partners/Practices/Protection from [employer] hx STI/Pregnancy Plans): not sexually active  Menstrual History/LMP/Abnormal Bleeding: hysterectomy  Incontinence Symptoms: mild symptoms or urgency but does not want medications at this time   Breast cancer:  - Last Mammogram: 11/2018 - BRCA gene screening: N/A  Osteoporosis: Discussed high calcium and vitamin D supplementation, weight bearing exercises  Cervical cancer screening: N/A  Skin cancer: Discussed monitoring for atypical lesions  Colorectal cancer: repeat in 2023  Lung cancer:   Low Dose CT Chest recommended if Age 35-80 years, 30 pack-year currently smoking OR have quit w/in 15years. Patient does not qualify.   ECG: 10/2016  Advanced Care Planning: A voluntary discussion about advance care planning including the explanation and discussion of advance directives.  Discussed health care proxy and Living will, and the patient was able to identify a health care proxy as oldest son- Hilliard Clark .  Patient does have a living will at present time. If patient does have living will, I have requested they bring this to the clinic to be scanned in to their chart.  Lipids: Lab Results  Component Value Date   CHOL 172 05/31/2018   CHOL 177 08/24/2017   CHOL 148 11/09/2016   Lab Results  Component Value Date   HDL 60 05/31/2018   HDL 54 08/24/2017   HDL 51 11/09/2016    Lab Results  Component Value Date   LDLCALC 87 05/31/2018   LDLCALC 95 08/24/2017   LDLCALC 72 11/09/2016   Lab Results  Component Value Date   TRIG 146 05/31/2018   TRIG 184 (H) 08/24/2017   TRIG 124 11/09/2016   Lab Results  Component Value Date   CHOLHDL 2.9 05/31/2018   CHOLHDL 3.3 08/24/2017   CHOLHDL 2.9 11/09/2016   No results found for: LDLDIRECT  Glucose: Glucose, Bld  Date Value Ref Range Status  05/31/2018 112 (H) 65 - 99 mg/dL Final    Comment:    .            Fasting reference interval . For someone without known diabetes, a glucose value between 100 and 125 mg/dL is consistent with prediabetes and should be confirmed with a follow-up test. .   08/24/2017 163 (H) 65 - 99 mg/dL Final    Comment:    .            Fasting reference interval . For someone without known diabetes, a glucose value >125 mg/dL indicates that they may have diabetes and this should be confirmed with a follow-up test. .   11/09/2016 132 (H) 65 - 99 mg/dL Final    Patient Active Problem List   Diagnosis Date Noted  . Sensorineural hearing loss of combined sites, bilateral 11/19/2015  . Glaucoma 08/24/2014  . Cataract 08/24/2014  . Dyslipidemia associated with type 2 diabetes mellitus ([place]) 08/24/2014  . Benign hypertension 08/24/2014  . Dyslipidemia 08/24/2014    Past Surgical History:  Procedure Laterality Date  . BREAST BIOPSY Bilateral    rt/clip-02/27/03, left - all neg  . BREAST LUMPECTOMY Bilateral 1982   neg  . VAGINAL HYSTERECTOMY  1986    Family History  Problem Relation Age of Onset  . Depression Mother   . Rheum arthritis Father   . Hypertension Sister   . Breast cancer Sister   . Alcohol abuse Brother   . Hypertension Sister   . Heart disease Sister   . Cancer Sister        lung  . Hypertension Sister   . Kidney disease Sister   . Breast cancer Cousin        pat cousin  . Hypertension Sister   . Breast cancer Paternal Aunt     Social  History   Socioeconomic History  . Marital status: Widowed    Spouse name: Leane Para  . Number of children: 2  . Years of education: Not on file  . Highest education level: Bachelor's degree (e.g., BA, AB, BS)  Occupational History  . Occupation: Retired  Tobacco Use  . Smoking status: Never Smoker  . Smokeless tobacco: Never Used  . Tobacco comment: smoking cessation materials not required  Substance and Sexual Activity  . Alcohol use: No    Alcohol/week: 0.0 standard drinks  . Drug use: No  . Sexual activity: Not Currently    Partners: Male  Other Topics Concern  . Not on file  Social History Narrative   Widow since 2020, two children in [place] Determinants of Health   Financial Resource Strain: Low Risk   . Difficulty of Paying Living Expenses: Not hard at all  Food Insecurity: No Food Insecurity  . Worried About Charity fundraiser in the Last Year: Never true  . Ran Out of Food in the Last Year: Never true  Transportation Needs: No Transportation Needs  . Lack of Transportation (Medical): No  . Lack of Transportation (Non-Medical): No  Physical Activity: Inactive  . Days of Exercise per Week: 0 days  . Minutes of Exercise per Session: 0 min  Stress: No Stress Concern Present  . Feeling of Stress : Not at all  Social Connections: Slightly Isolated  . Frequency of Communication with Friends and Family: More than three times a week  . Frequency of Social Gatherings with Friends and Family: More than three times a week  . Attends Religious Services: More than 4 times per year  . Active Member of Clubs or Organizations: Yes  . Attends Archivist Meetings: More than 4 times per year  . Marital Status: Widowed  Intimate Partner Violence: Not At Risk  . Fear of Current or Ex-Partner: No  . Emotionally Abused: No  . Physically Abused: No  . Sexually Abused: No     Current Outpatient Medications:  .  aspirin 81 MG tablet, Take by mouth., Disp:  , Rfl:  .  atorvastatin (LIPITOR) 80 MG tablet, Take 1 tablet (80 mg total) by mouth daily., Disp: 90 tablet, Rfl: 1 .  brimonidine (ALPHAGAN) 0.15 % ophthalmic solution, Place 1 drop into both eyes 2 (two) times daily. x30 days, Disp: , Rfl: 5 .  Empagliflozin-metFORMIN HCl ER (SYNJARDY XR) 12.09-998 MG TB24, Take 1 tablet by mouth daily., Disp: 90 tablet, Rfl: 1 .  glucose blood (ONE TOUCH ULTRA TEST) test strip, Use as instructed, Disp: 100 each, Rfl: 12 .  latanoprost (XALATAN) 0.005 % ophthalmic solution, Apply to eye., Disp: , Rfl:  .  MULTIPLE VITAMIN PO, Take by mouth., Disp: , Rfl:  .  telmisartan-hydrochlorothiazide (MICARDIS HCT) 40-12.5 MG tablet, Take 1 tablet by mouth daily., Disp: 90 tablet, Rfl: 1  Allergies  Allergen Reactions  . Clindamycin/Lincomycin Nausea Only     ROS  Constitutional: Negative for fever or weight change.  Respiratory: Negative for cough and shortness of breath.   Cardiovascular: Negative for chest pain or palpitations.  Gastrointestinal: Negative for abdominal pain, no bowel changes.  Musculoskeletal: Negative for gait problem or joint swelling.  Skin: Negative for rash.  Neurological: Negative for dizziness or headache.  No other specific complaints in a complete review of systems (except as listed in HPI above).  Objective  Vitals:   05/28/19 1043  BP: 120/82  Pulse: 73  Resp: 16  Temp: (!) 97.1 F (36.2 C)  TempSrc: Temporal  SpO2: 97%  Weight: 193 lb 4.8 oz (87.7 kg)  Height: 5' 10" (1.778 m)    Body mass index is 27.74 kg/m.  Physical Exam  Constitutional: Patient appears well-developed and well-nourished. No distress.  HENT: Head: Normocephalic and atraumatic. Ears: B TMs ok, no erythema or effusion; Nose: Nose normal. Mouth/Throat: not done  Eyes:  Conjunctivae and EOM are normal. Pupils are equal, round, and reactive to light. No scleral icterus.  Neck: Normal range of motion. Neck supple. No JVD present. No thyromegaly  present.  Cardiovascular: Normal rate, regular rhythm and normal heart sounds.  No murmur heard. No BLE edema. Pulmonary/Chest: Effort normal and breath sounds normal. No respiratory distress. Abdominal: Soft. Bowel sounds are normal, no distension. There is no tenderness. no masses Breast: no lumps or masses, no nipple discharge or rashes FEMALE GENITALIA:  Pelvic not done  RECTAL: not done Musculoskeletal: Normal range of motion, no joint effusions. No gross deformities Neurological: he is alert and oriented to person, place, and time. No cranial nerve deficit. Coordination, balance, strength, speech and gait are normal.  Skin: Skin is warm and dry. No rash noted. No erythema.  Psychiatric: Patient has a normal mood and affect. behavior is normal. Judgment and thought content normal.  Fall Risk: Fall Risk  05/28/2019 01/24/2019 01/24/2019 12/22/2018 09/27/2018  Falls in the past year? 0 0 0 0 0  Comment - - - - -  Number falls in past yr: 0 0 0 0 0  Injury with Fall? 0 0 0 0 0  Comment - - - - -  Risk for fall due to : - - - - -  Risk for fall due to: Comment - - - - -  Follow up - - - Falls prevention discussed -     Functional Status Survey: Is the patient deaf or have difficulty hearing?: No Does the patient have difficulty seeing, even when wearing glasses/contacts?: No Does the patient have difficulty concentrating, remembering, or making decisions?: No Does the patient have difficulty walking or climbing stairs?: No Does the patient have difficulty dressing or bathing?: No Does the patient have difficulty doing errands alone such as visiting a doctor's office or shopping?: No   Assessment & Plan  1. Dyslipidemia associated with type 2 diabetes mellitus (HCC)  - Lipid panel - Hemoglobin A1c  2. Dyslipidemia  - Lipid panel  3. Hypertension associated with diabetes ([place])  - COMPLETE METABOLIC PANEL WITH GFR - CBC with Differential/Platelet  4. Well adult  exam   -USPSTF grade A and B recommendations reviewed with patient; age-appropriate recommendations, preventive care, screening tests, etc discussed and encouraged; healthy living encouraged; see AVS for patient education given to patient -Discussed importance of 150 minutes of physical activity weekly, eat two servings of fish weekly, eat one serving of tree nuts ( cashews, pistachios, pecans, almonds.Marland Kitchen) every other day, eat 6 servings of fruit/vegetables daily and drink plenty of water and avoid sweet beverages.

## 2019-05-28 NOTE — Patient Instructions (Addendum)
Preventive Care 77 Years and Older, Female Preventive care refers to lifestyle choices and visits with your health care provider that can promote health and wellness. This includes:  A yearly physical exam. This is also called an annual well check.  Regular dental and eye exams.  Immunizations.  Screening for certain conditions.  Healthy lifestyle choices, such as diet and exercise. What can I expect for my preventive care visit? Physical exam Your health care provider will check:  Height and weight. These may be used to calculate body mass index (BMI), which is a measurement that tells if you are at a healthy weight.  Heart rate and blood pressure.  Your skin for abnormal spots. Counseling Your health care provider may ask you questions about:  Alcohol, tobacco, and drug use.  Emotional well-being.  Home and relationship well-being.  Sexual activity.  Eating habits.  History of falls.  Memory and ability to understand (cognition).  Work and work Statistician.  Pregnancy and menstrual history. What immunizations do I need?  Influenza (flu) vaccine  This is recommended every year. Tetanus, diphtheria, and pertussis (Tdap) vaccine  You may need a Td booster every 10 years. Varicella (chickenpox) vaccine  You may need this vaccine if you have not already been vaccinated. Zoster (shingles) vaccine  You may need this after age 77. Pneumococcal conjugate (PCV13) vaccine  One dose is recommended after age 77. Pneumococcal polysaccharide (PPSV23) vaccine  One dose is recommended after age 77. Measles, mumps, and rubella (MMR) vaccine  You may need at least one dose of MMR if you were born in 1957 or later. You may also need a second dose. Meningococcal conjugate (MenACWY) vaccine  You may need this if you have certain conditions. Hepatitis A vaccine  You may need this if you have certain conditions or if you travel or work in places where you may be exposed  to hepatitis A. Hepatitis B vaccine  You may need this if you have certain conditions or if you travel or work in places where you may be exposed to hepatitis B. Haemophilus influenzae type b (Hib) vaccine  You may need this if you have certain conditions. You may receive vaccines as individual doses or as more than one vaccine together in one shot (combination vaccines). Talk with your health care provider about the risks and benefits of combination vaccines. What tests do I need? Blood tests  Lipid and cholesterol levels. These may be checked every 5 years, or more frequently depending on your overall health.  Hepatitis C test.  Hepatitis B test. Screening  Lung cancer screening. You may have this screening every year starting at age 77 if you have a 30-pack-year history of smoking and currently smoke or have quit within the past 15 years.  Colorectal cancer screening. All adults should have this screening starting at age 77 and continuing until age 15. Your health care provider may recommend screening at age 23 if you are at increased risk. You will have tests every 1-10 years, depending on your results and the type of screening test.  Diabetes screening. This is done by checking your blood sugar (glucose) after you have not eaten for a while (fasting). You may have this done every 1-3 years.  Mammogram. This may be done every 1-2 years. Talk with your health care provider about how often you should have regular mammograms.  BRCA-related cancer screening. This may be done if you have a family history of breast, ovarian, tubal, or peritoneal cancers.  Other tests  Sexually transmitted disease (STD) testing.  Bone density scan. This is done to screen for osteoporosis. You may have this done starting at age 65. Follow these instructions at home: Eating and drinking  Eat a diet that includes fresh fruits and vegetables, whole grains, lean protein, and low-fat dairy products. Limit  your intake of foods with high amounts of sugar, saturated fats, and salt.  Take vitamin and mineral supplements as recommended by your health care provider.  Do not drink alcohol if your health care provider tells you not to drink.  If you drink alcohol: ? Limit how much you have to 0-1 drink a day. ? Be aware of how much alcohol is in your drink. In the [place] one drink equals one 12 oz bottle of beer (355 mL), one 5 oz glass of wine (148 mL), or one 1 oz glass of hard liquor (44 mL). Lifestyle  Take daily care of your teeth and gums.  Stay active. Exercise for at least 30 minutes on 5 or more days each week.  Do not use any products that contain nicotine or tobacco, such as cigarettes, e-cigarettes, and chewing tobacco. If you need help quitting, ask your health care provider.  If you are sexually active, practice safe sex. Use a condom or other form of protection in order to prevent STIs (sexually transmitted infections).  Talk with your health care provider about taking a low-dose aspirin or statin. What's next?  Go to your health care provider once a year for a well check visit.  Ask your health care provider how often you should have your eyes and teeth checked.  Stay up to date on all vaccines. This information is not intended to replace advice given to you by your health care provider. Make sure you discuss any questions you have with your health care provider. Document Revised: 05/04/2018 Document Reviewed: 05/04/2018 Elsevier Patient Education  2020 Elsevier Inc.  Preventive Care 65 Years and Older, Female Preventive care refers to lifestyle choices and visits with your health care provider that can promote health and wellness. This includes:  A yearly physical exam. This is also called an annual well check.  Regular dental and eye exams.  Immunizations.  Screening for certain conditions.  Healthy lifestyle choices, such as diet and exercise. What can I expect  for my preventive care visit? Physical exam Your health care provider will check:  Height and weight. These may be used to calculate body mass index (BMI), which is a measurement that tells if you are at a healthy weight.  Heart rate and blood pressure.  Your skin for abnormal spots. Counseling Your health care provider may ask you questions about:  Alcohol, tobacco, and drug use.  Emotional well-being.  Home and relationship well-being.  Sexual activity.  Eating habits.  History of falls.  Memory and ability to understand (cognition).  Work and work environment.  Pregnancy and menstrual history. What immunizations do I need?  Influenza (flu) vaccine  This is recommended every year. Tetanus, diphtheria, and pertussis (Tdap) vaccine  You may need a Td booster every 10 years. Varicella (chickenpox) vaccine  You may need this vaccine if you have not already been vaccinated. Zoster (shingles) vaccine  You may need this after age 60. Pneumococcal conjugate (PCV13) vaccine  One dose is recommended after age 65. Pneumococcal polysaccharide (PPSV23) vaccine  One dose is recommended after age 65. Measles, mumps, and rubella (MMR) vaccine  You may need at least one dose   of MMR if you were born in 1957 or later. You may also need a second dose. Meningococcal conjugate (MenACWY) vaccine  You may need this if you have certain conditions. Hepatitis A vaccine  You may need this if you have certain conditions or if you travel or work in places where you may be exposed to hepatitis A. Hepatitis B vaccine  You may need this if you have certain conditions or if you travel or work in places where you may be exposed to hepatitis B. Haemophilus influenzae type b (Hib) vaccine  You may need this if you have certain conditions. You may receive vaccines as individual doses or as more than one vaccine together in one shot (combination vaccines). Talk with your health care  provider about the risks and benefits of combination vaccines. What tests do I need? Blood tests  Lipid and cholesterol levels. These may be checked every 5 years, or more frequently depending on your overall health.  Hepatitis C test.  Hepatitis B test. Screening  Lung cancer screening. You may have this screening every year starting at age 55 if you have a 30-pack-year history of smoking and currently smoke or have quit within the past 15 years.  Colorectal cancer screening. All adults should have this screening starting at age 50 and continuing until age 75. Your health care provider may recommend screening at age 45 if you are at increased risk. You will have tests every 1-10 years, depending on your results and the type of screening test.  Diabetes screening. This is done by checking your blood sugar (glucose) after you have not eaten for a while (fasting). You may have this done every 1-3 years.  Mammogram. This may be done every 1-2 years. Talk with your health care provider about how often you should have regular mammograms.  BRCA-related cancer screening. This may be done if you have a family history of breast, ovarian, tubal, or peritoneal cancers. Other tests  Sexually transmitted disease (STD) testing.  Bone density scan. This is done to screen for osteoporosis. You may have this done starting at age 65. Follow these instructions at home: Eating and drinking  Eat a diet that includes fresh fruits and vegetables, whole grains, lean protein, and low-fat dairy products. Limit your intake of foods with high amounts of sugar, saturated fats, and salt.  Take vitamin and mineral supplements as recommended by your health care provider.  Do not drink alcohol if your health care provider tells you not to drink.  If you drink alcohol: ? Limit how much you have to 0-1 drink a day. ? Be aware of how much alcohol is in your drink. In the [place] one drink equals one 12 oz bottle of  beer (355 mL), one 5 oz glass of wine (148 mL), or one 1 oz glass of hard liquor (44 mL). Lifestyle  Take daily care of your teeth and gums.  Stay active. Exercise for at least 30 minutes on 5 or more days each week.  Do not use any products that contain nicotine or tobacco, such as cigarettes, e-cigarettes, and chewing tobacco. If you need help quitting, ask your health care provider.  If you are sexually active, practice safe sex. Use a condom or other form of protection in order to prevent STIs (sexually transmitted infections).  Talk with your health care provider about taking a low-dose aspirin or statin. What's next?  Go to your health care provider once a year for a well check visit.    Ask your health care provider how often you should have your eyes and teeth checked.  Stay up to date on all vaccines. This information is not intended to replace advice given to you by your health care provider. Make sure you discuss any questions you have with your health care provider. Document Revised: 05/04/2018 Document Reviewed: 05/04/2018 Elsevier Patient Education  2020 Elsevier Inc.  

## 2019-05-29 LAB — CBC WITH DIFFERENTIAL/PLATELET
Absolute Monocytes: 360 cells/uL (ref 200–950)
Basophils Absolute: 20 cells/uL (ref 0–200)
Basophils Relative: 0.6 %
Eosinophils Absolute: 40 cells/uL (ref 15–500)
Eosinophils Relative: 1.2 %
HCT: 39 % (ref 35.0–45.0)
Hemoglobin: 13.1 g/dL (ref 11.7–15.5)
Lymphs Abs: 1416 cells/uL (ref 850–3900)
MCH: 30.8 pg (ref 27.0–33.0)
MCHC: 33.6 g/dL (ref 32.0–36.0)
MCV: 91.8 fL (ref 80.0–100.0)
MPV: 10.6 fL (ref 7.5–12.5)
Monocytes Relative: 10.9 %
Neutro Abs: 1465 cells/uL — ABNORMAL LOW (ref 1500–7800)
Neutrophils Relative %: 44.4 %
Platelets: 234 10*3/uL (ref 140–400)
RBC: 4.25 10*6/uL (ref 3.80–5.10)
RDW: 13 % (ref 11.0–15.0)
Total Lymphocyte: 42.9 %
WBC: 3.3 10*3/uL — ABNORMAL LOW (ref 3.8–10.8)

## 2019-05-29 LAB — COMPLETE METABOLIC PANEL WITH GFR
AG Ratio: 1.4 (calc) (ref 1.0–2.5)
ALT: 21 U/L (ref 6–29)
AST: 18 U/L (ref 10–35)
Albumin: 4.2 g/dL (ref 3.6–5.1)
Alkaline phosphatase (APISO): 82 U/L (ref 37–153)
BUN: 14 mg/dL (ref 7–25)
CO2: 32 mmol/L (ref 20–32)
Calcium: 9.3 mg/dL (ref 8.6–10.4)
Chloride: 104 mmol/L (ref 98–110)
Creat: 0.63 mg/dL (ref 0.60–0.93)
GFR, Est African American: 101 mL/min/{1.73_m2} (ref >=60)
GFR, Est Non African American: 87 mL/min/{1.73_m2} (ref >=60)
Globulin: 3.1 g/dL (calc) (ref 1.9–3.7)
Glucose, Bld: 119 mg/dL — ABNORMAL HIGH (ref 65–99)
Potassium: 4 mmol/L (ref 3.5–5.3)
Sodium: 142 mmol/L (ref 135–146)
Total Bilirubin: 0.6 mg/dL (ref 0.2–1.2)
Total Protein: 7.3 g/dL (ref 6.1–8.1)

## 2019-05-29 LAB — LIPID PANEL
Cholesterol: 173 mg/dL (ref <200)
HDL: 64 mg/dL (ref >=50)
LDL Cholesterol (Calc): 87 mg/dL (calc)
Non-HDL Cholesterol (Calc): 109 mg/dL (calc) (ref <130)
Total CHOL/HDL Ratio: 2.7 (calc) (ref <5.0)
Triglycerides: 127 mg/dL (ref <150)

## 2019-05-29 LAB — HEMOGLOBIN A1C
Hgb A1c MFr Bld: 6.8 % of total Hgb — ABNORMAL HIGH (ref <5.7)
Mean Plasma Glucose: 148 (calc)
eAG (mmol/L): 8.2 (calc)

## 2019-06-11 DIAGNOSIS — H401132 Primary open-angle glaucoma, bilateral, moderate stage: Secondary | ICD-10-CM | POA: Diagnosis not present

## 2019-08-18 ENCOUNTER — Other Ambulatory Visit: Payer: Self-pay | Admitting: Family Medicine

## 2019-08-18 DIAGNOSIS — I10 Essential (primary) hypertension: Secondary | ICD-10-CM

## 2019-08-18 DIAGNOSIS — E1159 Type 2 diabetes mellitus with other circulatory complications: Secondary | ICD-10-CM

## 2019-08-27 ENCOUNTER — Other Ambulatory Visit: Payer: Self-pay | Admitting: Family Medicine

## 2019-08-27 DIAGNOSIS — E785 Hyperlipidemia, unspecified: Secondary | ICD-10-CM

## 2019-09-27 ENCOUNTER — Other Ambulatory Visit: Payer: Self-pay | Admitting: Family Medicine

## 2019-09-27 DIAGNOSIS — E1169 Type 2 diabetes mellitus with other specified complication: Secondary | ICD-10-CM

## 2019-10-17 ENCOUNTER — Other Ambulatory Visit: Payer: Self-pay | Admitting: Family Medicine

## 2019-10-17 DIAGNOSIS — Z1231 Encounter for screening mammogram for malignant neoplasm of breast: Secondary | ICD-10-CM

## 2019-10-26 ENCOUNTER — Other Ambulatory Visit: Payer: Self-pay | Admitting: Family Medicine

## 2019-10-26 DIAGNOSIS — E1169 Type 2 diabetes mellitus with other specified complication: Secondary | ICD-10-CM

## 2019-11-11 ENCOUNTER — Other Ambulatory Visit: Payer: Self-pay | Admitting: Family Medicine

## 2019-11-11 DIAGNOSIS — I10 Essential (primary) hypertension: Secondary | ICD-10-CM

## 2019-11-11 DIAGNOSIS — E1159 Type 2 diabetes mellitus with other circulatory complications: Secondary | ICD-10-CM

## 2019-11-11 NOTE — Telephone Encounter (Signed)
Requested Prescriptions  Pending Prescriptions Disp Refills  . telmisartan-hydrochlorothiazide (MICARDIS HCT) 40-12.5 MG tablet [Pharmacy Med Name: TELMISARTAN-HCTZ 40-12.5 MG TB] 90 tablet 0    Sig: TAKE 1 TABLET BY MOUTH EVERY DAY     Cardiovascular: ARB + Diuretic Combos Passed - 11/11/2019 12:55 AM      Passed - K in normal range and within 180 days    Potassium  Date Value Ref Range Status  05/28/2019 4.0 3.5 - 5.3 mmol/L Final         Passed - Na in normal range and within 180 days    Sodium  Date Value Ref Range Status  05/28/2019 142 135 - 146 mmol/L Final  11/04/2015 143 134 - 144 mmol/L Final         Passed - Cr in normal range and within 180 days    Creat  Date Value Ref Range Status  05/28/2019 0.63 0.60 - 0.93 mg/dL Final    Comment:    For patients >6 years of age, the reference limit for Creatinine is approximately 13% higher for people identified as African-American. .          Passed - Ca in normal range and within 180 days    Calcium  Date Value Ref Range Status  05/28/2019 9.3 8.6 - 10.4 mg/dL Final         Passed - Patient is not pregnant      Passed - Last BP in normal range    BP Readings from Last 1 Encounters:  05/28/19 120/82         Passed - Valid encounter within last 6 months    Recent Outpatient Visits          5 months ago Dyslipidemia associated with type 2 diabetes mellitus [facility])   [facility], Drue Stager, MD   9 months ago Dyslipidemia associated with type 2 diabetes mellitus [facility])   [facility], Drue Stager, MD   1 year ago Dyslipidemia associated with type 2 diabetes mellitus [facility])   [facility], Drue Stager, MD   1 year ago Dyslipidemia associated with type 2 diabetes mellitus [facility])   [facility] Steele Sizer, MD   1 year ago Dyslipidemia associated with type 2 diabetes mellitus [facility])   [facility] Steele Sizer, MD      Future Appointments            In 2 weeks Ancil Boozer, Drue Stager, MD [facility] [address], [place]   In 1 month  [facility], [facility]

## 2019-11-27 ENCOUNTER — Ambulatory Visit
Admission: RE | Admit: 2019-11-27 | Discharge: 2019-11-27 | Disposition: A | Discharge status: Home / Self Care | Payer: Medicare PPO | Source: Ambulatory Visit | Attending: Family Medicine | Admitting: Family Medicine

## 2019-11-27 ENCOUNTER — Other Ambulatory Visit: Payer: Self-pay

## 2019-11-27 DIAGNOSIS — Z1231 Encounter for screening mammogram for malignant neoplasm of breast: Secondary | ICD-10-CM

## 2019-11-28 ENCOUNTER — Ambulatory Visit: Payer: Medicare PPO | Admitting: Family Medicine

## 2019-11-28 ENCOUNTER — Encounter: Payer: Self-pay | Admitting: Family Medicine

## 2019-11-28 VITALS — BP 140/90 | HR 59 | Temp 97.5°F | Resp 16 | Ht 70.0 in | Wt 193.9 lb

## 2019-11-28 DIAGNOSIS — E1169 Type 2 diabetes mellitus with other specified complication: Secondary | ICD-10-CM | POA: Diagnosis not present

## 2019-11-28 DIAGNOSIS — E1159 Type 2 diabetes mellitus with other circulatory complications: Secondary | ICD-10-CM

## 2019-11-28 DIAGNOSIS — D72819 Decreased white blood cell count, unspecified: Secondary | ICD-10-CM

## 2019-11-28 DIAGNOSIS — E785 Hyperlipidemia, unspecified: Secondary | ICD-10-CM | POA: Diagnosis not present

## 2019-11-28 DIAGNOSIS — M8588 Other specified disorders of bone density and structure, other site: Secondary | ICD-10-CM | POA: Diagnosis not present

## 2019-11-28 DIAGNOSIS — I10 Essential (primary) hypertension: Secondary | ICD-10-CM | POA: Diagnosis not present

## 2019-11-28 LAB — POCT GLYCOSYLATED HEMOGLOBIN (HGB A1C): Hemoglobin A1C: 6.8 % — AB (ref 4.0–5.6)

## 2019-11-28 MED ORDER — ROSUVASTATIN CALCIUM 40 MG PO TABS: 40.0000 mg | ORAL_TABLET | Freq: Every day | ORAL | 1 refills | Status: DC

## 2019-11-28 MED ORDER — TELMISARTAN-HCTZ 80-12.5 MG PO TABS: 1.0000 | ORAL_TABLET | Freq: Every day | ORAL | 0 refills | Status: DC

## 2019-11-28 MED ORDER — SYNJARDY XR 12.5-1000 MG PO TB24: 1.0000 | ORAL_TABLET | Freq: Every day | ORAL | 1 refills | Status: DC

## 2019-11-28 NOTE — Progress Notes (Signed)
Name: Adrienne Anderson   MRN: [id number]    DOB: [birth year]   Date:11/28/2019       Progress Note  Subjective  Chief Complaint  Chief Complaint  Patient presents with   Diabetes   Dyslipidemia   Hypertension   Back Pain    Complains of lower back pain that she has had for years, but is gradually worsening. Pain comes and goes mostly in the morning. She does exercises to help with the pain.    HPI  DMII : sheis now on Sanjardy- no recent episodes of yeast infections. Glucose at home has been around 150's fasting over the past few days  HerA1C in 12/2017was 7.3%,past couple of visits was 6.8 %. She denies polyphagia, polydipsia or polyuria, only has nocturia about once per night. She has glaucoma and cataract, but is stillstable. On 81 mg aspirin, statin therapy, on an ARB. She is a widow discussed diet, needs to stop eating a lot of pasta or replace with spaghetti squash, whole wheat pasta, avoid white potatoes   HTN:she is on Micardis HCTZ, bp is borderline today, no chest pain or palpitation   Leucopenia: improved, we will recheck next visit   Hyperlipidemia: taking Atorvastatin, denies myalgia, LDL goal is below 70 and currently at 87, she is on high dose of Atorvastatin we will change to Crestor 40 mg and recheck  Intermittent Low back pain: she was pregnant with twins and has a long history of of back pain , but has noticed increase in lower back pain, feels stiff when first getting up in am. She changed her mattress and is doing slightly better. She states she has been sitting more in her office chair doing a creative card making class. Discussed regular exercise, get a body pillow since she has been waking up on her back in am's   Osteopenia: discussed vitamin D and high calcium diet    Patient Active Problem List   Diagnosis Date Noted   Sensorineural hearing loss of combined sites, bilateral 11/19/2015   Glaucoma 08/24/2014   Cataract 08/24/2014    Dyslipidemia associated with type 2 diabetes mellitus ([place]) 08/24/2014   Benign hypertension 08/24/2014   Dyslipidemia 08/24/2014    Past Surgical History:  Procedure Laterality Date   BREAST BIOPSY Bilateral    rt/clip-02/27/03, left - all neg   BREAST LUMPECTOMY Bilateral 1982   neg   VAGINAL HYSTERECTOMY  1986    Family History  Problem Relation Age of Onset   Depression Mother    Rheum arthritis Father    Hypertension Sister    Breast cancer Sister    Alcohol abuse Brother    Hypertension Sister    Heart disease Sister    Cancer Sister        lung   Hypertension Sister    Kidney disease Sister    Breast cancer Cousin        pat cousin   Hypertension Sister    Breast cancer Paternal Aunt     Social History   Tobacco Use   Smoking status: Never Smoker   Smokeless tobacco: Never Used   Tobacco comment: smoking cessation materials not required  Substance Use Topics   Alcohol use: No    Alcohol/week: 0.0 standard drinks     Current Outpatient Medications:    aspirin 81 MG tablet, Take by mouth., Disp: , Rfl:    atorvastatin (LIPITOR) 80 MG tablet, TAKE 1 TABLET BY MOUTH EVERY DAY, Disp: 90 tablet, Rfl: 1  brimonidine (ALPHAGAN) 0.15 % ophthalmic solution, Place 1 drop into both eyes 2 (two) times daily. x30 days, Disp: , Rfl: 5   glucose blood (ONE TOUCH ULTRA TEST) test strip, Use as instructed, Disp: 100 each, Rfl: 12   latanoprost (XALATAN) 0.005 % ophthalmic solution, Apply to eye., Disp: , Rfl:    MULTIPLE VITAMIN PO, Take by mouth., Disp: , Rfl:    SYNJARDY XR 12.09-998 MG TB24, TAKE 1 TABLET BY MOUTH EVERY DAY, Disp: 90 tablet, Rfl: 0   telmisartan-hydrochlorothiazide (MICARDIS HCT) 40-12.5 MG tablet, TAKE 1 TABLET BY MOUTH EVERY DAY, Disp: 90 tablet, Rfl: 0  Allergies  Allergen Reactions   Clindamycin/Lincomycin Nausea Only    I personally reviewed active problem list, medication list, allergies, family history, social  history, health maintenance with the patient/caregiver today.   ROS  Constitutional: Negative for fever or weight change.  Respiratory: Negative for cough and shortness of breath.   Cardiovascular: Negative for chest pain or palpitations.  Gastrointestinal: Negative for abdominal pain, no bowel changes.  Musculoskeletal: Negative for gait problem or joint swelling.  Skin: Negative for rash.  Neurological: Negative for dizziness or headache.  No other specific complaints in a complete review of systems (except as listed in HPI above).  Objective  Vitals:   11/28/19 0747 11/28/19 0750  BP: 140/90 140/90  Pulse: (!) 59   Resp: 16   Temp: (!) 97.5 F (36.4 C)   TempSrc: Temporal   SpO2: 96%   Weight: 193 lb 14.4 oz (88 kg)   Height: 5\' 10"  (1.778 m)     Body mass index is 27.82 kg/m.  Physical Exam  Constitutional: Patient appears well-developed and well-nourished. Obese  No distress.  HEENT: head atraumatic, normocephalic, pupils equal and reactive to light, neck supple Cardiovascular: Normal rate, regular rhythm and normal heart sounds.  No murmur heard. No BLE edema. Pulmonary/Chest: Effort normal and breath sounds normal. No respiratory distress. Abdominal: Soft.  There is no tenderness. Muscular Skeletal: normal back rom, negative straight leg raise Psychiatric: Patient has a normal mood and affect. behavior is normal. Judgment and thought content normal.   Recent Results (from the past 2160 hour(s))  POCT HgB A1C     Status: Abnormal   Collection Time: 11/28/19  7:52 AM  Result Value Ref Range   Hemoglobin A1C 6.8 (A) 4.0 - 5.6 %   HbA1c POC (<> result, manual entry)     HbA1c, POC (prediabetic range)     HbA1c, POC (controlled diabetic range)       PHQ2/9: Depression screen [facility] 2/9 11/28/2019 05/28/2019 01/24/2019 12/22/2018 09/27/2018  Decreased Interest 0 0 0 0 0  Down, Depressed, Hopeless 0 0 0 0 0  PHQ - 2 Score 0 0 0 0 0  Altered sleeping 0 0 0 - 0  Tired,  decreased energy 0 0 0 - 0  Change in appetite 0 0 0 - 0  Feeling bad or failure about yourself  0 0 0 - 0  Trouble concentrating 0 0 0 - 0  Moving slowly or fidgety/restless 0 0 0 - 0  Suicidal thoughts 0 0 0 - 0  PHQ-9 Score 0 0 0 - 0  Difficult doing work/chores - Not difficult at all - - Not difficult at all  Some recent data might be hidden    phq 9 is negative   Fall Risk: Fall Risk  11/28/2019 05/28/2019 01/24/2019 01/24/2019 12/22/2018  Falls in the past year? 0 0 0 0 0  Comment - - - - -  Number falls in past yr: 0 0 0 0 0  Injury with Fall? 0 0 0 0 0  Comment - - - - -  Risk for fall due to : - - - - -  Risk for fall due to: Comment - - - - -  Follow up - - - - Falls prevention discussed    Functional Status Survey: Is the patient deaf or have difficulty hearing?: No Does the patient have difficulty seeing, even when wearing glasses/contacts?: No Does the patient have difficulty concentrating, remembering, or making decisions?: No Does the patient have difficulty walking or climbing stairs?: No Does the patient have difficulty dressing or bathing?: No Does the patient have difficulty doing errands alone such as visiting a doctor's office or shopping?: No    Assessment & Plan  1. Dyslipidemia associated with type 2 diabetes mellitus (HCC)  - POCT HgB A1C - rosuvastatin (CRESTOR) 40 MG tablet; Take 1 tablet (40 mg total) by mouth daily. In place of Atorvastatin for cholesterol  Dispense: 90 tablet; Refill: 1 - Empagliflozin-metFORMIN HCl ER (SYNJARDY XR) 12.09-998 MG TB24; Take 1 tablet by mouth daily.  Dispense: 90 tablet; Refill: 1  2. Hypertension associated with diabetes ([place])  - telmisartan-hydrochlorothiazide (MICARDIS HCT) 80-12.5 MG tablet; Take 1 tablet by mouth daily.  Dispense: 90 tablet; Refill: 0  3. Osteopenia of lumbar spine   4. Benign hypertension  Adjusting medications   5. Dyslipidemia  - rosuvastatin (CRESTOR) 40 MG tablet; Take 1 tablet (40  mg total) by mouth daily. In place of Atorvastatin for cholesterol  Dispense: 90 tablet; Refill: 1

## 2019-11-28 NOTE — Patient Instructions (Addendum)
At home take 1.5 tablets of Telmisartan hctz 40/12.5 until out of medication, after that replaced with 1 tablet of 80/12.5   Once you finish Atorvastatin 80 mg you will start Rosuvastatin 40 mg daily for cholesterol

## 2019-12-12 DIAGNOSIS — H401132 Primary open-angle glaucoma, bilateral, moderate stage: Secondary | ICD-10-CM | POA: Diagnosis not present

## 2019-12-12 LAB — HM DIABETES EYE EXAM

## 2019-12-27 ENCOUNTER — Ambulatory Visit (INDEPENDENT_AMBULATORY_CARE_PROVIDER_SITE_OTHER): Payer: Medicare PPO

## 2019-12-27 ENCOUNTER — Other Ambulatory Visit: Payer: Self-pay

## 2019-12-27 VITALS — BP 120/80 | HR 72 | Temp 98.3°F | Resp 16 | Ht 70.0 in | Wt 192.1 lb

## 2019-12-27 DIAGNOSIS — Z0000 Encounter for general adult medical examination without abnormal findings: Secondary | ICD-10-CM | POA: Diagnosis not present

## 2019-12-27 NOTE — Progress Notes (Signed)
Subjective:   Adrienne Anderson is a 79 y.o. female who presents for Medicare Annual (Subsequent) preventive examination.  Review of Systems     Cardiac Risk Factors include: advanced age (>103men, >41 women);diabetes mellitus;dyslipidemia;hypertension     Objective:    Today's Vitals   12/27/19 1047  BP: 120/80  Pulse: 72  Resp: 16  Temp: 98.3 F (36.8 C)  TempSrc: Oral  SpO2: 97%  Weight: 192 lb 1.6 oz (87.1 kg)  Height: 5\' 10"  (1.778 m)   Body mass index is 27.56 kg/m.  Advanced Directives 12/27/2019 12/22/2018 12/20/2017 11/09/2016 11/02/2016 05/04/2016 11/03/2015  Does Patient Have a Medical Advance Directive? Yes Yes Yes Yes Yes Yes Yes  Type of Paramedic of [place];Living will [place];Living will Living will;Healthcare Power of [place];Living will [place];Living will [place];Living will [place];Living will  Does patient want to make changes to medical advance directive? - No - Patient declined - - - - -  Copy of [place] in Chart? No - copy requested No - copy requested No - copy requested - No - copy requested No - copy requested No - copy requested  Would patient like information on creating a medical advance directive? - - - - - - -    Current Medications (verified) Outpatient Encounter Medications as of 12/27/2019  Medication Sig   aspirin 81 MG tablet Take by mouth.   brimonidine (ALPHAGAN) 0.15 % ophthalmic solution Place 1 drop into both eyes 2 (two) times daily. x30 days   Empagliflozin-metFORMIN HCl ER (SYNJARDY XR) 12.09-998 MG TB24 Take 1 tablet by mouth daily.   glucose blood (ONE TOUCH ULTRA TEST) test strip Use as instructed   latanoprost (XALATAN) 0.005 % ophthalmic solution Apply to eye.   MULTIPLE VITAMIN PO Take by mouth.   rosuvastatin (CRESTOR) 40 MG tablet Take 1 tablet (40 mg total) by mouth  daily. In place of Atorvastatin for cholesterol   telmisartan-hydrochlorothiazide (MICARDIS HCT) 80-12.5 MG tablet Take 1 tablet by mouth daily.   No facility-administered encounter medications on file as of 12/27/2019.    Allergies (verified) Clindamycin/lincomycin   History: Past Medical History:  Diagnosis Date   Cataract    Diabetes mellitus without complication ([place])    Glaucoma    Hyperlipidemia    Hypertension    Obesity    Past Surgical History:  Procedure Laterality Date   BREAST BIOPSY Bilateral    rt/clip-02/27/03, left - all neg   BREAST LUMPECTOMY Bilateral 1982   neg   VAGINAL HYSTERECTOMY  1986   Family History  Problem Relation Age of Onset   Depression Mother    Rheum arthritis Father    Hypertension Sister    Breast cancer Sister    Alcohol abuse Brother    Hypertension Sister    Heart disease Sister    Cancer Sister        lung   Hypertension Sister    Kidney disease Sister    Breast cancer Cousin        pat cousin   Hypertension Sister    Breast cancer Paternal Aunt    Social History   Socioeconomic History   Marital status: Widowed    Spouse name: Leane Para   Number of children: 2   Years of education: Not on file   Highest education level: Bachelor's degree (e.g., BA, AB, BS)  Occupational History  Occupation: Retired  Tobacco Use   Smoking status: Never Smoker   Smokeless tobacco: Never Used   Tobacco comment: smoking cessation materials not required  Vaping Use   Vaping Use: Never used  Substance and Sexual Activity   Alcohol use: No    Alcohol/week: 0.0 standard drinks   Drug use: No   Sexual activity: Not Currently    Partners: Male  Other Topics Concern   Not on file  Social History Narrative   Widow since 2020, two children in [place] Determinants of Health   Financial Resource Strain: Low Risk    Difficulty of Paying Living Expenses: Not hard at all  Food Insecurity:  No Food Insecurity   Worried About Charity fundraiser in the Last Year: Never true   Arboriculturist in the Last Year: Never true  Transportation Needs: No Transportation Needs   Lack of Transportation (Medical): No   Lack of Transportation (Non-Medical): No  Physical Activity: Inactive   Days of Exercise per Week: 0 days   Minutes of Exercise per Session: 0 min  Stress: No Stress Concern Present   Feeling of Stress : Not at all  Social Connections: Moderately Integrated   Frequency of Communication with Friends and Family: More than three times a week   Frequency of Social Gatherings with Friends and Family: More than three times a week   Attends Religious Services: More than 4 times per year   Active Member of [employer] or Organizations: Yes   Attends Archivist Meetings: More than 4 times per year   Marital Status: Widowed    Tobacco Counseling Counseling given: Not Answered Comment: smoking cessation materials not required   Clinical Intake:  Pre-visit preparation completed: Yes  Pain : No/denies pain     Nutritional Status: BMI 25 -29 Overweight Nutritional Risks: None Diabetes: Yes CBG done?: No CBG resulted in Enter/ Edit results?: No Did pt. bring in CBG monitor from home?: No  How often do you need to have someone help you when you read instructions, pamphlets, or other written materials from your doctor or pharmacy?: 1 - Never  Nutrition Risk Assessment:  Has the patient had any N/V/D within the last 2 months?  No  Does the patient have any non-healing wounds?  No  Has the patient had any unintentional weight loss or weight gain?  No   Diabetes:  Is the patient diabetic?  Yes  If diabetic, was a CBG obtained today?  No  Did the patient bring in their glucometer from home?  No  How often do you monitor your CBG's? Several times per week.   Financial Strains and Diabetes Management:  Are you having any financial strains with the  device, your supplies or your medication? No .  Does the patient want to be seen by Chronic Care Management for management of their diabetes?  No  Would the patient like to be referred to a Nutritionist or for Diabetic Management?  No   Diabetic Exams:  Diabetic Eye Exam: Completed 12/12/19 negative retinopathy.   Diabetic Foot Exam: Completed 01/24/19.    Interpreter Needed?: No  Information entered by :: Clemetine Marker LPN   Activities of Daily Living In your present state of health, do you have any difficulty performing the following activities: 12/27/2019 11/28/2019  Hearing? N N  Comment declines hearing aids -  Vision? N N  Difficulty concentrating or making decisions? N N  Walking or climbing  stairs? N N  Dressing or bathing? N N  Doing errands, shopping? N N  Preparing Food and eating ? N -  Using the Toilet? N -  In the past six months, have you accidently leaked urine? N -  Comment wears pads for protection -  Do you have problems with loss of bowel control? N -  Managing your Medications? N -  Managing your Finances? N -  Housekeeping or managing your Housekeeping? N -  Some recent data might be hidden    Patient Care Team: Steele Sizer, MD as PCP - General (Family Medicine) Birder Robson, MD as Consulting Physician (Ophthalmology)  Indicate any recent Medical Services you may have received from other than [facility] providers in the past year (date may be approximate).     Assessment:   This is a routine wellness examination for Gaylynn.  Hearing/Vision screen  Hearing Screening   125Hz  250Hz  500Hz  1000Hz  2000Hz  3000Hz  4000Hz  6000Hz  8000Hz   Right ear:           Left ear:           Comments: Pt denies hearing difficulty  Vision Screening Comments: [facility] screenings [facility] at [facility]  Dietary issues and exercise activities discussed: Current Exercise Habits: The patient does not participate in regular exercise at present, Exercise limited by:  None identified  Goals     DIET - INCREASE WATER INTAKE     Recommend to drink at least 6-8 8oz glasses of water per day.      Depression Screen PHQ 2/9 Scores 12/27/2019 11/28/2019 05/28/2019 01/24/2019 12/22/2018 09/27/2018 05/29/2018  PHQ - 2 Score 0 0 0 0 0 0 0  PHQ- 9 Score - 0 0 0 - 0 1    Fall Risk Fall Risk  12/27/2019 11/28/2019 05/28/2019 01/24/2019 01/24/2019  Falls in the past year? 0 0 0 0 0  Comment - - - - -  Number falls in past yr: 0 0 0 0 0  Injury with Fall? 0 0 0 0 0  Comment - - - - -  Risk for fall due to : No Fall Risks - - - -  Risk for fall due to: Comment - - - - -  Follow up Falls prevention discussed - - - -    Any stairs in or around the home? Yes  If so, are there any without handrails? No  Home free of loose throw rugs in walkways, pet beds, electrical cords, etc? Yes  Adequate lighting in your home to reduce risk of falls? Yes   ASSISTIVE DEVICES UTILIZED TO PREVENT FALLS:  Life alert? No  Use of a cane, walker or w/c? No  Grab bars in the bathroom? Yes  Shower chair or bench in shower? Yes  Elevated toilet seat or a handicapped toilet? Yes   TIMED UP AND GO:  Was the test performed? Yes .  Length of time to ambulate 10 feet: 5 sec.   Gait steady and fast without use of assistive device  Cognitive Function:     6CIT Screen 12/27/2019 12/22/2018 12/20/2017  What Year? 0 points 0 points 0 points  What month? 0 points 0 points 0 points  What time? 0 points 0 points 0 points  Count back from 20 0 points 0 points 0 points  Months in reverse 0 points 0 points 0 points  Repeat phrase 0 points 0 points 2 points  Total Score 0 0 2    Immunizations Immunization  History  Administered Date(s) Administered   Fluad Quad(high Dose 65+) 01/24/2019   Influenza Split 03/25/2014   Influenza, High Dose Seasonal PF 03/10/2016, 03/28/2017, 02/02/2018   Influenza, Seasonal, Injecte, Preservative Fre 05/03/2012   Influenza,inj,Quad PF,6+ Mos 03/06/2015   Moderna  SARS-COVID-2 Vaccination 06/29/2019, 07/27/2019   Pneumococcal Conjugate-13 10/30/2014   Pneumococcal Polysaccharide-23 05/03/2012   Tdap 08/03/2012   Zoster 11/06/2012   Zoster Recombinat (Shingrix) 02/02/2018, 04/04/2018    TDAP status: Up to date   Flu Vaccine status: Up to date   Pneumococcal vaccine status: Up to date   Covid-19 vaccine status: Completed vaccines  Qualifies for Shingles Vaccine? Yes   Zostavax completed Yes   Shingrix Completed?: Yes  Screening Tests Health Maintenance  Topic Date Due   Hepatitis C Screening  Never done   INFLUENZA VACCINE  12/23/2019   FOOT EXAM  01/24/2020   HEMOGLOBIN A1C  05/30/2020   MAMMOGRAM  11/26/2020   OPHTHALMOLOGY EXAM  12/11/2020   TETANUS/TDAP  08/04/2022   DEXA SCAN  Completed   COVID-19 Vaccine  Completed   PNA vac Low Risk Adult  Completed    Health Maintenance  Health Maintenance Due  Topic Date Due   Hepatitis C Screening  Never done   INFLUENZA VACCINE  12/23/2019    Colorectal cancer screening: No longer required.    Mammogram status: Completed 11/27/19. Repeat every year  Bone Density status: Completed 02/13/18. Results reflect: Bone density results: NORMAL. Repeat every 2 years. Pt to postpone for now.   Lung Cancer Screening: (Low Dose CT Chest recommended if Age 76-80 years, 30 pack-year currently smoking OR have quit w/in 15years.) does not qualify.   Additional Screening:  Hepatitis C Screening: does qualify; postponed   Vision Screening: Recommended annual ophthalmology exams for early detection of glaucoma and other disorders of the eye. Is the patient up to date with their annual eye exam?  Yes  Who is the provider or what is the name of the office in which the patient attends annual eye exams? [facility] Screening: Recommended annual dental exams for proper oral hygiene  Community Resource Referral / Chronic Care Management: CRR required this visit?  No    CCM required this visit?  No      Plan:     I have personally reviewed and noted the following in the patients chart:    Medical and social history  Use of alcohol, tobacco or illicit drugs   Current medications and supplements  Functional ability and status  Nutritional status  Physical activity  Advanced directives  List of other physicians  Hospitalizations, surgeries, and ER visits in previous 12 months  Vitals  Screenings to include cognitive, depression, and falls  Referrals and appointments  In addition, I have reviewed and discussed with patient certain preventive protocols, quality metrics, and best practice recommendations. A written personalized care plan for preventive services as well as general preventive health recommendations were provided to patient.     Clemetine Marker, LPN   09/21/256   Nurse Notes: pt doing well and appreciative of visit today

## 2019-12-27 NOTE — Patient Instructions (Signed)
Adrienne Anderson , Thank you for taking time to come for your Medicare Wellness Visit. I appreciate your ongoing commitment to your health goals. Please review the following plan we discussed and let me know if I can assist you in the future.   Screening recommendations/referrals: Colonoscopy: no longer required Mammogram: done 11/27/19 Bone Density: done 02/13/18 Recommended yearly ophthalmology/optometry visit for glaucoma screening and checkup Recommended yearly dental visit for hygiene and checkup  Vaccinations: Influenza vaccine: done 01/24/19 Pneumococcal vaccine: done 10/30/14 Tdap vaccine: done 08/03/12 Shingles vaccine: done 02/02/18 & 04/04/18   Covid-19:done 06/29/19 & 07/27/19  Advanced directives: Please bring a copy of your health care power of attorney and living will to the office at your convenience.  Conditions/risks identified: Recommend increasing physical activity   Next appointment: Follow up in one year for your annual wellness visit    Preventive Care 65 Years and Older, Female Preventive care refers to lifestyle choices and visits with your health care provider that can promote health and wellness. What does preventive care include?  A yearly physical exam. This is also called an annual well check.  Dental exams once or twice a year.  Routine eye exams. Ask your health care provider how often you should have your eyes checked.  Personal lifestyle choices, including:  Daily care of your teeth and gums.  Regular physical activity.  Eating a healthy diet.  Avoiding tobacco and drug use.  Limiting alcohol use.  Practicing safe sex.  Taking low-dose aspirin every day.  Taking vitamin and mineral supplements as recommended by your health care provider. What happens during an annual well check? The services and screenings done by your health care provider during your annual well check will depend on your age, overall health, lifestyle risk factors, and family history  of disease. Counseling  Your health care provider may ask you questions about your:  Alcohol use.  Tobacco use.  Drug use.  Emotional well-being.  Home and relationship well-being.  Sexual activity.  Eating habits.  History of falls.  Memory and ability to understand (cognition).  Work and work Statistician.  Reproductive health. Screening  You may have the following tests or measurements:  Height, weight, and BMI.  Blood pressure.  Lipid and cholesterol levels. These may be checked every 5 years, or more frequently if you are over 48 years old.  Skin check.  Lung cancer screening. You may have this screening every year starting at age 69 if you have a 30-pack-year history of smoking and currently smoke or have quit within the past 15 years.  Fecal occult blood test (FOBT) of the stool. You may have this test every year starting at age 40.  Flexible sigmoidoscopy or colonoscopy. You may have a sigmoidoscopy every 5 years or a colonoscopy every 10 years starting at age 1.  Hepatitis C blood test.  Hepatitis B blood test.  Sexually transmitted disease (STD) testing.  Diabetes screening. This is done by checking your blood sugar (glucose) after you have not eaten for a while (fasting). You may have this done every 1-3 years.  Bone density scan. This is done to screen for osteoporosis. You may have this done starting at age 53.  Mammogram. This may be done every 1-2 years. Talk to your health care provider about how often you should have regular mammograms. Talk with your health care provider about your test results, treatment options, and if necessary, the need for more tests. Vaccines  Your health care provider may recommend  certain vaccines, such as:  Influenza vaccine. This is recommended every year.  Tetanus, diphtheria, and acellular pertussis (Tdap, Td) vaccine. You may need a Td booster every 10 years.  Zoster vaccine. You may need this after age  52.  Pneumococcal 13-valent conjugate (PCV13) vaccine. One dose is recommended after age 28.  Pneumococcal polysaccharide (PPSV23) vaccine. One dose is recommended after age 13. Talk to your health care provider about which screenings and vaccines you need and how often you need them. This information is not intended to replace advice given to you by your health care provider. Make sure you discuss any questions you have with your health care provider. Document Released: 06/06/2015 Document Revised: 01/28/2016 Document Reviewed: 03/11/2015 Elsevier Interactive Patient Education  2017 [place] Prevention in the Home Falls can cause injuries. They can happen to people of all ages. There are many things you can do to make your home safe and to help prevent falls. What can I do on the outside of my home?  Regularly fix the edges of walkways and driveways and fix any cracks.  Remove anything that might make you trip as you walk through a door, such as a raised step or threshold.  Trim any bushes or trees on the path to your home.  Use bright outdoor lighting.  Clear any walking paths of anything that might make someone trip, such as rocks or tools.  Regularly check to see if handrails are loose or broken. Make sure that both sides of any steps have handrails.  Any raised decks and porches should have guardrails on the edges.  Have any leaves, snow, or ice cleared regularly.  Use sand or salt on walking paths during winter.  Clean up any spills in your garage right away. This includes oil or grease spills. What can I do in the bathroom?  Use night lights.  Install grab bars by the toilet and in the tub and shower. Do not use towel bars as grab bars.  Use non-skid mats or decals in the tub or shower.  If you need to sit down in the shower, use a plastic, non-slip stool.  Keep the floor dry. Clean up any water that spills on the floor as soon as it happens.  Remove  soap buildup in the tub or shower regularly.  Attach bath mats securely with double-sided non-slip rug tape.  Do not have throw rugs and other things on the floor that can make you trip. What can I do in the bedroom?  Use night lights.  Make sure that you have a light by your bed that is easy to reach.  Do not use any sheets or blankets that are too big for your bed. They should not hang down onto the floor.  Have a firm chair that has side arms. You can use this for support while you get dressed.  Do not have throw rugs and other things on the floor that can make you trip. What can I do in the kitchen?  Clean up any spills right away.  Avoid walking on wet floors.  Keep items that you use a lot in easy-to-reach places.  If you need to reach something above you, use a strong step stool that has a grab bar.  Keep electrical cords out of the way.  Do not use floor polish or wax that makes floors slippery. If you must use wax, use non-skid floor wax.  Do not have throw rugs and other  things on the floor that can make you trip. What can I do with my stairs?  Do not leave any items on the stairs.  Make sure that there are handrails on both sides of the stairs and use them. Fix handrails that are broken or loose. Make sure that handrails are as long as the stairways.  Check any carpeting to make sure that it is firmly attached to the stairs. Fix any carpet that is loose or worn.  Avoid having throw rugs at the top or bottom of the stairs. If you do have throw rugs, attach them to the floor with carpet tape.  Make sure that you have a light switch at the top of the stairs and the bottom of the stairs. If you do not have them, ask someone to add them for you. What else can I do to help prevent falls?  Wear shoes that:  Do not have high heels.  Have rubber bottoms.  Are comfortable and fit you well.  Are closed at the toe. Do not wear sandals.  If you use a  stepladder:  Make sure that it is fully opened. Do not climb a closed stepladder.  Make sure that both sides of the stepladder are locked into place.  Ask someone to hold it for you, if possible.  Clearly mark and make sure that you can see:  Any grab bars or handrails.  First and last steps.  Where the edge of each step is.  Use tools that help you move around (mobility aids) if they are needed. These include:  Canes.  Walkers.  Scooters.  Crutches.  Turn on the lights when you go into a dark area. Replace any light bulbs as soon as they burn out.  Set up your furniture so you have a clear path. Avoid moving your furniture around.  If any of your floors are uneven, fix them.  If there are any pets around you, be aware of where they are.  Review your medicines with your doctor. Some medicines can make you feel dizzy. This can increase your chance of falling. Ask your doctor what other things that you can do to help prevent falls. This information is not intended to replace advice given to you by your health care provider. Make sure you discuss any questions you have with your health care provider. Document Released: 03/06/2009 Document Revised: 10/16/2015 Document Reviewed: 06/14/2014 Elsevier Interactive Patient Education  2017 [employer].

## 2020-01-21 DIAGNOSIS — Z7984 Long term (current) use of oral hypoglycemic drugs: Secondary | ICD-10-CM | POA: Diagnosis not present

## 2020-01-21 DIAGNOSIS — Z7982 Long term (current) use of aspirin: Secondary | ICD-10-CM | POA: Diagnosis not present

## 2020-01-21 DIAGNOSIS — R32 Unspecified urinary incontinence: Secondary | ICD-10-CM | POA: Diagnosis not present

## 2020-01-21 DIAGNOSIS — I10 Essential (primary) hypertension: Secondary | ICD-10-CM | POA: Diagnosis not present

## 2020-01-21 DIAGNOSIS — G8929 Other chronic pain: Secondary | ICD-10-CM | POA: Diagnosis not present

## 2020-01-21 DIAGNOSIS — H409 Unspecified glaucoma: Secondary | ICD-10-CM | POA: Diagnosis not present

## 2020-01-21 DIAGNOSIS — K08409 Partial loss of teeth, unspecified cause, unspecified class: Secondary | ICD-10-CM | POA: Diagnosis not present

## 2020-01-21 DIAGNOSIS — E785 Hyperlipidemia, unspecified: Secondary | ICD-10-CM | POA: Diagnosis not present

## 2020-01-21 DIAGNOSIS — E1136 Type 2 diabetes mellitus with diabetic cataract: Secondary | ICD-10-CM | POA: Diagnosis not present

## 2020-02-02 ENCOUNTER — Other Ambulatory Visit: Payer: Self-pay | Admitting: Family Medicine

## 2020-02-02 DIAGNOSIS — E1159 Type 2 diabetes mellitus with other circulatory complications: Secondary | ICD-10-CM

## 2020-02-02 DIAGNOSIS — I10 Essential (primary) hypertension: Secondary | ICD-10-CM

## 2020-02-18 ENCOUNTER — Other Ambulatory Visit: Payer: Self-pay | Admitting: Family Medicine

## 2020-02-18 DIAGNOSIS — E785 Hyperlipidemia, unspecified: Secondary | ICD-10-CM

## 2020-02-18 DIAGNOSIS — E1169 Type 2 diabetes mellitus with other specified complication: Secondary | ICD-10-CM

## 2020-02-29 ENCOUNTER — Other Ambulatory Visit: Payer: Self-pay | Admitting: Family Medicine

## 2020-02-29 DIAGNOSIS — I10 Essential (primary) hypertension: Secondary | ICD-10-CM

## 2020-02-29 DIAGNOSIS — E1159 Type 2 diabetes mellitus with other circulatory complications: Secondary | ICD-10-CM

## 2020-03-05 DIAGNOSIS — S0181XA Laceration without foreign body of other part of head, initial encounter: Secondary | ICD-10-CM | POA: Diagnosis not present

## 2020-03-05 DIAGNOSIS — M4802 Spinal stenosis, cervical region: Secondary | ICD-10-CM | POA: Diagnosis not present

## 2020-03-05 DIAGNOSIS — S3993XA Unspecified injury of pelvis, initial encounter: Secondary | ICD-10-CM | POA: Diagnosis not present

## 2020-03-05 DIAGNOSIS — S301XXA Contusion of abdominal wall, initial encounter: Secondary | ICD-10-CM | POA: Diagnosis not present

## 2020-03-05 DIAGNOSIS — E119 Type 2 diabetes mellitus without complications: Secondary | ICD-10-CM | POA: Diagnosis not present

## 2020-03-05 DIAGNOSIS — S59911A Unspecified injury of right forearm, initial encounter: Secondary | ICD-10-CM | POA: Diagnosis not present

## 2020-03-05 DIAGNOSIS — S3992XA Unspecified injury of lower back, initial encounter: Secondary | ICD-10-CM | POA: Diagnosis not present

## 2020-03-05 DIAGNOSIS — S6992XA Unspecified injury of left wrist, hand and finger(s), initial encounter: Secondary | ICD-10-CM | POA: Diagnosis not present

## 2020-03-05 DIAGNOSIS — S199XXA Unspecified injury of neck, initial encounter: Secondary | ICD-10-CM | POA: Diagnosis not present

## 2020-03-05 DIAGNOSIS — Z20822 Contact with and (suspected) exposure to covid-19: Secondary | ICD-10-CM | POA: Diagnosis not present

## 2020-03-05 DIAGNOSIS — S0003XA Contusion of scalp, initial encounter: Secondary | ICD-10-CM | POA: Diagnosis not present

## 2020-03-05 DIAGNOSIS — M8580 Other specified disorders of bone density and structure, unspecified site: Secondary | ICD-10-CM | POA: Diagnosis not present

## 2020-03-05 DIAGNOSIS — Z23 Encounter for immunization: Secondary | ICD-10-CM | POA: Diagnosis not present

## 2020-03-05 DIAGNOSIS — S3690XA Unspecified injury of unspecified intra-abdominal organ, initial encounter: Secondary | ICD-10-CM | POA: Diagnosis not present

## 2020-03-05 DIAGNOSIS — M47812 Spondylosis without myelopathy or radiculopathy, cervical region: Secondary | ICD-10-CM | POA: Diagnosis not present

## 2020-03-05 DIAGNOSIS — S8991XA Unspecified injury of right lower leg, initial encounter: Secondary | ICD-10-CM | POA: Diagnosis not present

## 2020-03-05 DIAGNOSIS — M47816 Spondylosis without myelopathy or radiculopathy, lumbar region: Secondary | ICD-10-CM | POA: Diagnosis not present

## 2020-03-05 DIAGNOSIS — S299XXA Unspecified injury of thorax, initial encounter: Secondary | ICD-10-CM | POA: Diagnosis not present

## 2020-03-05 DIAGNOSIS — S8992XA Unspecified injury of left lower leg, initial encounter: Secondary | ICD-10-CM | POA: Diagnosis not present

## 2020-03-05 DIAGNOSIS — E041 Nontoxic single thyroid nodule: Secondary | ICD-10-CM | POA: Diagnosis not present

## 2020-03-05 DIAGNOSIS — S0990XA Unspecified injury of head, initial encounter: Secondary | ICD-10-CM | POA: Diagnosis not present

## 2020-03-06 DIAGNOSIS — S59911A Unspecified injury of right forearm, initial encounter: Secondary | ICD-10-CM | POA: Diagnosis not present

## 2020-03-07 DIAGNOSIS — S0181XA Laceration without foreign body of other part of head, initial encounter: Secondary | ICD-10-CM | POA: Diagnosis not present

## 2020-03-07 DIAGNOSIS — S301XXA Contusion of abdominal wall, initial encounter: Secondary | ICD-10-CM | POA: Diagnosis not present

## 2020-03-07 DIAGNOSIS — R936 Abnormal findings on diagnostic imaging of limbs: Secondary | ICD-10-CM | POA: Diagnosis not present

## 2020-03-07 DIAGNOSIS — Y92410 Unspecified street and highway as the place of occurrence of the external cause: Secondary | ICD-10-CM | POA: Diagnosis not present

## 2020-03-07 DIAGNOSIS — G8911 Acute pain due to trauma: Secondary | ICD-10-CM | POA: Diagnosis not present

## 2020-03-10 ENCOUNTER — Ambulatory Visit: Payer: Medicare PPO | Admitting: Family Medicine

## 2020-03-10 ENCOUNTER — Other Ambulatory Visit: Payer: Self-pay

## 2020-03-10 ENCOUNTER — Encounter: Payer: Self-pay | Admitting: Family Medicine

## 2020-03-10 VITALS — BP 140/80 | HR 90 | Temp 98.6°F | Resp 16 | Ht 70.0 in | Wt 186.1 lb

## 2020-03-10 DIAGNOSIS — D649 Anemia, unspecified: Secondary | ICD-10-CM | POA: Diagnosis not present

## 2020-03-10 DIAGNOSIS — S301XXD Contusion of abdominal wall, subsequent encounter: Secondary | ICD-10-CM

## 2020-03-10 DIAGNOSIS — E041 Nontoxic single thyroid nodule: Secondary | ICD-10-CM

## 2020-03-10 LAB — CBC WITH DIFFERENTIAL/PLATELET
Absolute Monocytes: 449 cells/uL (ref 200–950)
Basophils Absolute: 20 cells/uL (ref 0–200)
Basophils Relative: 0.5 %
Eosinophils Absolute: 51 cells/uL (ref 15–500)
Eosinophils Relative: 1.3 %
HCT: 34.2 % — ABNORMAL LOW (ref 35.0–45.0)
Hemoglobin: 11.7 g/dL (ref 11.7–15.5)
Lymphs Abs: 1408 cells/uL (ref 850–3900)
MCH: 31.9 pg (ref 27.0–33.0)
MCHC: 34.2 g/dL (ref 32.0–36.0)
MCV: 93.2 fL (ref 80.0–100.0)
MPV: 11.8 fL (ref 7.5–12.5)
Monocytes Relative: 11.5 %
Neutro Abs: 1973 cells/uL (ref 1500–7800)
Neutrophils Relative %: 50.6 %
Platelets: 263 10*3/uL (ref 140–400)
RBC: 3.67 10*6/uL — ABNORMAL LOW (ref 3.80–5.10)
RDW: 13 % (ref 11.0–15.0)
Total Lymphocyte: 36.1 %
WBC: 3.9 10*3/uL (ref 3.8–10.8)
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
## 2020-03-14 ENCOUNTER — Other Ambulatory Visit: Payer: Self-pay | Admitting: Family Medicine

## 2020-03-14 DIAGNOSIS — E1169 Type 2 diabetes mellitus with other specified complication: Secondary | ICD-10-CM

## 2020-03-14 DIAGNOSIS — E785 Hyperlipidemia, unspecified: Secondary | ICD-10-CM

## 2020-03-16 ENCOUNTER — Other Ambulatory Visit: Payer: Self-pay | Admitting: Family Medicine

## 2020-03-16 DIAGNOSIS — I10 Essential (primary) hypertension: Secondary | ICD-10-CM

## 2020-03-16 DIAGNOSIS — I152 Hypertension secondary to endocrine disorders: Secondary | ICD-10-CM

## 2020-03-17 ENCOUNTER — Telehealth: Payer: Self-pay
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
## 2020-03-19 ENCOUNTER — Other Ambulatory Visit: Payer: Self-pay | Admitting: Family Medicine

## 2020-03-19 ENCOUNTER — Other Ambulatory Visit: Payer: Self-pay

## 2020-03-19 ENCOUNTER — Telehealth: Payer: Self-pay

## 2020-03-19 ENCOUNTER — Ambulatory Visit
Admission: RE | Admit: 2020-03-19 | Discharge: 2020-03-19 | Disposition: A | Discharge status: Home / Self Care | Payer: Medicare PPO | Source: Ambulatory Visit | Attending: Family Medicine | Admitting: Family Medicine

## 2020-03-19 DIAGNOSIS — E041 Nontoxic single thyroid nodule: Secondary | ICD-10-CM | POA: Insufficient documentation

## 2020-03-19 DIAGNOSIS — S301XXD Contusion of abdominal wall, subsequent encounter: Secondary | ICD-10-CM

## 2020-03-19 NOTE — Telephone Encounter (Signed)
Patient called. She said she is not happy with the information and would like to proceed with x-ray or [place] as requested.

## 2020-03-19 NOTE — Telephone Encounter (Signed)
Copied from [place] [phone] [id number]. Topic: General - Other >> Mar 14, 2020 11:31 AM Celene Kras wrote: Reason for CRM: Pt called stating that she still has a hematoma on her abdomen and is requesting to have orders placed to receive an X Ray. Please advise.

## 2020-03-20 ENCOUNTER — Other Ambulatory Visit: Payer: Self-pay | Admitting: Family Medicine

## 2020-03-20 DIAGNOSIS — I10 Essential (primary) hypertension: Secondary | ICD-10-CM

## 2020-03-20 DIAGNOSIS — E1159 Type 2 diabetes mellitus with other circulatory complications: Secondary | ICD-10-CM

## 2020-03-21 ENCOUNTER — Ambulatory Visit (INDEPENDENT_AMBULATORY_CARE_PROVIDER_SITE_OTHER): Payer: Self-pay | Admitting: Family Medicine

## 2020-03-21 ENCOUNTER — Other Ambulatory Visit: Payer: Self-pay

## 2020-03-21 ENCOUNTER — Encounter: Payer: Self-pay | Admitting: Family Medicine

## 2020-03-21 ENCOUNTER — Telehealth: Payer: Self-pay

## 2020-03-21 DIAGNOSIS — S301XXD Contusion of abdominal wall, subsequent encounter: Secondary | ICD-10-CM
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
## 2020-03-21 NOTE — Patient Instructions (Signed)
Note   Referral has been sent to [facility]  P: [phone] F: [phone]  Release ID # [id number]

## 2020-03-21 NOTE — Telephone Encounter (Signed)
I called this patient to inform her that she has been scheduled to have her US Abdomen on Tuesday, April 01, 2020 @ 8am at the [facility]. Patient was instructed to arrive at least 15 mins early and not to have anything to eat or drink after midnight.  Patient expressed verbal understanding and said thanks.

## 2020-03-31 ENCOUNTER — Other Ambulatory Visit: Payer: Self-pay | Admitting: Family Medicine

## 2020-03-31 DIAGNOSIS — E785 Hyperlipidemia, unspecified: Secondary | ICD-10-CM

## 2020-03-31 DIAGNOSIS — E1169 Type 2 diabetes mellitus with other specified complication: Secondary | ICD-10-CM

## 2020-04-01 ENCOUNTER — Other Ambulatory Visit: Payer: Self-pay

## 2020-04-01 ENCOUNTER — Ambulatory Visit
Admission: RE | Admit: 2020-04-01 | Discharge: 2020-04-01 | Disposition: A | Discharge status: Home / Self Care | Payer: Medicare PPO | Source: Ambulatory Visit | Attending: Family Medicine | Admitting: Family Medicine

## 2020-04-01 DIAGNOSIS — R1904 Left lower quadrant abdominal swelling, mass and lump: Secondary | ICD-10-CM | POA: Diagnosis not present

## 2020-04-01 DIAGNOSIS — S301XXD Contusion of abdominal wall, subsequent encounter: Secondary | ICD-10-CM | POA: Diagnosis not present

## 2020-04-02 ENCOUNTER — Telehealth: Payer: Self-pay

## 2020-04-02 NOTE — Telephone Encounter (Signed)
Message left for the patient to call the office to see about scheduling a new patient visit with Dr Dahlia Byes for Monday to look at her hematoma.

## 2020-04-02 NOTE — Telephone Encounter (Signed)
Patient has called back and has been setup for an appt on Monday with Dr. Dahlia Byes.

## 2020-04-05 ENCOUNTER — Telehealth: Payer: Self-pay | Admitting: Family Medicine

## 2020-04-05 DIAGNOSIS — I10 Essential (primary) hypertension: Secondary | ICD-10-CM

## 2020-04-05 DIAGNOSIS — E1159 Type 2 diabetes mellitus with other circulatory complications: Secondary | ICD-10-CM

## 2020-04-07 ENCOUNTER — Ambulatory Visit (INDEPENDENT_AMBULATORY_CARE_PROVIDER_SITE_OTHER): Payer: Medicare PPO | Admitting: Surgery

## 2020-04-07 ENCOUNTER — Encounter: Payer: Self-pay | Admitting: Surgery

## 2020-04-07 ENCOUNTER — Other Ambulatory Visit: Payer: Self-pay

## 2020-04-07 VITALS — BP 123/74 | HR 70 | Temp 99.0°F | Ht 68.0 in | Wt 184.8 lb

## 2020-04-07 DIAGNOSIS — S301XXA Contusion of abdominal wall, initial encounter: Secondary | ICD-10-CM

## 2020-04-07 NOTE — Patient Instructions (Addendum)
Dr.Pabon discussed with patient to keep an eye on the area, and if she notices anything worsening to give our office a call.  Patient advised to continue to get yearly routine physicals, mammograms and colonoscopies. Follow-up with our office as needed. Please call and ask to speak with a nurse if you develop questions or concerns.  Hematoma A hematoma is a collection of blood. A hematoma can happen:  Under the skin.  In an organ.  In a body space.  In a joint space.  In other tissues. The blood can thicken (clot) to form a lump that you can see and feel. The lump is often hard and may become sore and tender. The lump can be very small or very big. Most hematomas get better in a few days to weeks. However, some hematomas may be serious and need medical care. What are the causes? This condition is caused by:  An injury.  Blood that leaks under the skin.  Problems from surgeries.  Medical conditions that cause bleeding or bruising. What increases the risk? You are more likely to develop this condition if:  You are an older adult.  You use medicines that thin your blood. What are the signs or symptoms? Symptoms depend on where the hematoma is in your body.  If the hematoma is under the skin, there is: ? A firm lump on the body. ? Pain and tenderness in the area. ? Bruising. The skin above the lump may be blue, dark blue, purple-red, or yellowish.  If the hematoma is deep in the tissues or body spaces, there may be: ? Blood in the stomach. This may cause pain in the belly (abdomen), weakness, passing out (fainting), and shortness of breath. ? Blood in the head. This may cause a headache, weakness, trouble speaking or understanding speech, or passing out. How is this diagnosed? This condition is diagnosed based on:  Your medical history.  A physical exam.  Imaging tests, such as ultrasound or CT scan.  Blood tests. How is this treated? Treatment depends on the  cause, size, and location of the hematoma. Treatment may include:  Doing nothing. Many hematomas go away on their own without treatment.  Surgery or close monitoring. This may be needed for large hematomas or hematomas that affect the body's organs.  Medicines. These may be given if a medical condition caused the hematoma. Follow these instructions at home: Managing pain, stiffness, and swelling   If told, put ice on the area. ? Put ice in a plastic bag. ? Place a towel between your skin and the bag. ? Leave the ice on for 20 minutes, 2-3 times a day for the first two days.  If told, put heat on the affected area after putting ice on the area for two days. Use the heat source that your doctor tells you to use. This could be a moist heat pack or a heating pad. To do this: ? Place a towel between your skin and the heat source. ? Leave the heat on for 20-30 minutes. ? Remove the heat if your skin turns bright red. This is very important if you are unable to feel pain, heat, or cold. You may have a greater risk of getting burned.  Raise (elevate) the affected area above the level of your heart while you are sitting or lying down.  Wrap the affected area with an elastic bandage, if told by your doctor. Do not wrap the bandage too tightly.  If your hematoma  is on a leg or foot and is painful, your doctor may give you crutches. Use them as told by your doctor. General instructions  Take over-the-counter and prescription medicines only as told by your doctor.  Keep all follow-up visits as told by your doctor. This is important. Contact a doctor if:  You have a fever.  The swelling or bruising gets worse.  You start to get more hematomas. Get help right away if:  Your pain gets worse.  Your pain is not getting better with medicine.  Your skin over the hematoma breaks or starts to bleed.  Your hematoma is in your chest or belly and you: ? Pass out. ? Feel weak. ? Become short  of breath.  You have a hematoma on your scalp that is caused by a fall or injury, and you: ? Have a headache that gets worse. ? Have trouble speaking or understanding speech. ? Become less alert or you pass out. Summary  A hematoma is a collection of blood in any part of your body.  Most hematomas get better on their own in a few days to weeks. Some may need medical care.  Follow instructions from your doctor about how to care for your hematoma.  Contact a doctor if the swelling or bruising gets worse, or if you are short of breath. This information is not intended to replace advice given to you by your health care provider. Make sure you discuss any questions you have with your health care provider. Document Revised: 10/13/2017 Document Reviewed: 10/13/2017 Elsevier Patient Education  2020 [employer].

## 2020-04-09 ENCOUNTER — Encounter: Payer: Self-pay | Admitting: Surgery

## 2020-04-09 ENCOUNTER — Telehealth: Payer: Self-pay
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
## 2020-04-11 ENCOUNTER — Other Ambulatory Visit: Payer: Self-pay

## 2020-04-11 DIAGNOSIS — I152 Hypertension secondary to endocrine disorders: Secondary | ICD-10-CM

## 2020-04-11 DIAGNOSIS — E1159 Type 2 diabetes mellitus with other circulatory complications: Secondary | ICD-10-CM

## 2020-04-11 MED ORDER — TELMISARTAN-HCTZ 80-12.5 MG PO TABS: 1.0000 | ORAL_TABLET | Freq: Every day | ORAL | 0 refills | Status: DC

## 2020-04-11 NOTE — Telephone Encounter (Signed)
Pt called about the status of her refill request for telmisartan-hydrochlorothiazide (MICARDIS HCT) 40-12.5 MG tablet/ Pt was advised it was denied and asked for a call to explain the dose change or the denial / please advise or send to pharmacy

## 2020-04-15 NOTE — Telephone Encounter (Signed)
Error

## 2020-04-24 ENCOUNTER — Other Ambulatory Visit: Payer: Self-pay | Admitting: Family Medicine

## 2020-04-24 DIAGNOSIS — I10 Essential (primary) hypertension: Secondary | ICD-10-CM

## 2020-04-24 DIAGNOSIS — I152 Hypertension secondary to endocrine disorders: Secondary | ICD-10-CM

## 2020-04-24 DIAGNOSIS — E1159 Type 2 diabetes mellitus with other circulatory complications: Secondary | ICD-10-CM

## 2020-05-01 DIAGNOSIS — E041 Nontoxic single thyroid nodule: Secondary | ICD-10-CM | POA: Diagnosis not present

## 2020-05-20 DIAGNOSIS — E041 Nontoxic single thyroid nodule: Secondary | ICD-10-CM | POA: Diagnosis not present

## 2020-05-20 HISTORY — PX: FINE NEEDLE ASPIRATION: SHX406

## 2020-05-22 DIAGNOSIS — E042 Nontoxic multinodular goiter: Secondary | ICD-10-CM | POA: Diagnosis not present
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
## 2020-05-29 ENCOUNTER — Other Ambulatory Visit: Payer: Self-pay

## 2020-05-29 ENCOUNTER — Encounter: Payer: Self-pay | Admitting: Family Medicine

## 2020-05-29 ENCOUNTER — Ambulatory Visit: Payer: Medicare PPO | Admitting: Family Medicine

## 2020-05-29 VITALS — BP 130/82 | HR 80 | Temp 98.0°F | Resp 18 | Ht 70.0 in | Wt 185.3 lb

## 2020-05-29 DIAGNOSIS — E785 Hyperlipidemia, unspecified: Secondary | ICD-10-CM | POA: Diagnosis not present

## 2020-05-29 DIAGNOSIS — E1159 Type 2 diabetes mellitus with other circulatory complications: Secondary | ICD-10-CM | POA: Diagnosis not present

## 2020-05-29 DIAGNOSIS — E041 Nontoxic single thyroid nodule: Secondary | ICD-10-CM | POA: Diagnosis not present

## 2020-05-29 DIAGNOSIS — I10 Essential (primary) hypertension: Secondary | ICD-10-CM | POA: Diagnosis not present

## 2020-05-29 DIAGNOSIS — M8588 Other specified disorders of bone density and structure, other site: Secondary | ICD-10-CM

## 2020-05-29 DIAGNOSIS — E1169 Type 2 diabetes mellitus with other specified complication: Secondary | ICD-10-CM

## 2020-05-29 DIAGNOSIS — I152 Hypertension secondary to endocrine disorders: Secondary | ICD-10-CM | POA: Diagnosis not present

## 2020-05-29 DIAGNOSIS — D72819 Decreased white blood cell count, unspecified: Secondary | ICD-10-CM

## 2020-05-29 LAB — POCT GLYCOSYLATED HEMOGLOBIN (HGB A1C): Hemoglobin A1C: 6.9 % — AB (ref 4.0–5.6)

## 2020-05-29 MED ORDER — SYNJARDY XR 12.5-1000 MG PO TB24: 1.0000 | ORAL_TABLET | Freq: Every day | ORAL | 1 refills | Status: DC

## 2020-05-29 MED ORDER — TELMISARTAN-HCTZ 80-12.5 MG PO TABS: 1.0000 | ORAL_TABLET | Freq: Every day | ORAL | 1 refills | Status: DC

## 2020-05-29 MED ORDER — ROSUVASTATIN CALCIUM 40 MG PO TABS: 40.0000 mg | ORAL_TABLET | Freq: Every day | ORAL | 1 refills | Status: DC

## 2020-05-30 LAB — CBC WITH DIFFERENTIAL/PLATELET
Absolute Monocytes: 490 cells/uL (ref 200–950)
Basophils Absolute: 29 cells/uL (ref 0–200)
Basophils Relative: 0.8 %
Eosinophils Absolute: 79 cells/uL (ref 15–500)
Eosinophils Relative: 2.2 %
HCT: 40.7 % (ref 35.0–45.0)
Hemoglobin: 13.6 g/dL (ref 11.7–15.5)
Lymphs Abs: 1390 cells/uL (ref 850–3900)
MCH: 31.7 pg (ref 27.0–33.0)
MCHC: 33.4 g/dL (ref 32.0–36.0)
MCV: 94.9 fL (ref 80.0–100.0)
MPV: 10.5 fL (ref 7.5–12.5)
Monocytes Relative: 13.6 %
Neutro Abs: 1613 cells/uL (ref 1500–7800)
Neutrophils Relative %: 44.8 %
Platelets: 243 10*3/uL (ref 140–400)
RBC: 4.29 10*6/uL (ref 3.80–5.10)
RDW: 12.8 % (ref 11.0–15.0)
Total Lymphocyte: 38.6 %
WBC: 3.6 10*3/uL — ABNORMAL LOW (ref 3.8–10.8)

## 2020-05-30 LAB — LIPID PANEL
Cholesterol: 149 mg/dL (ref <200)
HDL: 58 mg/dL (ref >=50)
LDL Cholesterol (Calc): 67 mg/dL (calc)
Non-HDL Cholesterol (Calc): 91 mg/dL (calc) (ref <130)
Total CHOL/HDL Ratio: 2.6 (calc) (ref <5.0)
Triglycerides: 164 mg/dL — ABNORMAL HIGH (ref <150)

## 2020-05-30 LAB — COMPLETE METABOLIC PANEL WITH GFR
AG Ratio: 1.3 (calc) (ref 1.0–2.5)
ALT: 19 U/L (ref 6–29)
AST: 18 U/L (ref 10–35)
Albumin: 4.1 g/dL (ref 3.6–5.1)
Alkaline phosphatase (APISO): 77 U/L (ref 37–153)
BUN: 12 mg/dL (ref 7–25)
CO2: 31 mmol/L (ref 20–32)
Calcium: 9.5 mg/dL (ref 8.6–10.4)
Chloride: 104 mmol/L (ref 98–110)
Creat: 0.74 mg/dL (ref 0.60–0.93)
GFR, Est African American: 91 mL/min/{1.73_m2} (ref >=60)
GFR, Est Non African American: 78 mL/min/{1.73_m2} (ref >=60)
Globulin: 3.2 g/dL (calc) (ref 1.9–3.7)
Glucose, Bld: 132 mg/dL — ABNORMAL HIGH (ref 65–99)
Potassium: 3.9 mmol/L (ref 3.5–5.3)
Sodium: 141 mmol/L (ref 135–146)
Total Bilirubin: 0.7 mg/dL (ref 0.2–1.2)
Total Protein: 7.3 g/dL (ref 6.1–8.1)

## 2020-05-30 LAB — MICROALBUMIN / CREATININE URINE RATIO
Creatinine, Urine: 112 mg/dL (ref 20–275)
Microalb Creat Ratio: 16 mcg/mg creat (ref <30)
Microalb, Ur: 1.8 mg/dL

## 2020-06-18 DIAGNOSIS — H43813 Vitreous degeneration, bilateral: Secondary | ICD-10-CM | POA: Diagnosis not present

## 2020-06-18 DIAGNOSIS — H2511 Age-related nuclear cataract, right eye: Secondary | ICD-10-CM | POA: Diagnosis not present

## 2020-07-15 DIAGNOSIS — E7800 Pure hypercholesterolemia, unspecified: Secondary | ICD-10-CM | POA: Diagnosis not present

## 2020-07-15 DIAGNOSIS — H2511 Age-related nuclear cataract, right eye: Secondary | ICD-10-CM | POA: Diagnosis not present

## 2020-07-22 ENCOUNTER — Encounter: Payer: Self-pay | Admitting: Ophthalmology

## 2020-07-22 ENCOUNTER — Other Ambulatory Visit: Payer: Self-pay

## 2020-07-25 ENCOUNTER — Other Ambulatory Visit: Payer: Self-pay

## 2020-07-25 ENCOUNTER — Other Ambulatory Visit
Admission: RE | Admit: 2020-07-25 | Discharge: 2020-07-25 | Disposition: A | Discharge status: Home / Self Care | Payer: Medicare PPO | Source: Ambulatory Visit | Attending: Ophthalmology | Admitting: Ophthalmology

## 2020-07-25 DIAGNOSIS — Z20822 Contact with and (suspected) exposure to covid-19: Secondary | ICD-10-CM | POA: Insufficient documentation

## 2020-07-25 DIAGNOSIS — Z01812 Encounter for preprocedural laboratory examination: Secondary | ICD-10-CM | POA: Insufficient documentation

## 2020-07-25 LAB — SARS CORONAVIRUS 2 (TAT 6-24 HRS): SARS Coronavirus 2: NEGATIVE

## 2020-07-25 NOTE — Discharge Instructions (Signed)

## 2020-07-29 ENCOUNTER — Encounter: Payer: Self-pay | Admitting: Ophthalmology

## 2020-07-29 ENCOUNTER — Ambulatory Visit: Payer: Medicare PPO | Admitting: Anesthesiology

## 2020-07-29 ENCOUNTER — Encounter
Admission: RE | Disposition: A | Discharge status: Home / Self Care | Payer: Self-pay | Source: Home / Self Care | Attending: Ophthalmology

## 2020-07-29 ENCOUNTER — Other Ambulatory Visit: Payer: Self-pay

## 2020-07-29 ENCOUNTER — Ambulatory Visit
Admission: RE | Admit: 2020-07-29 | Discharge: 2020-07-29 | Disposition: A | Discharge status: Home / Self Care | Payer: Medicare PPO | Attending: Ophthalmology | Admitting: Ophthalmology

## 2020-07-29 DIAGNOSIS — Z811 Family history of alcohol abuse and dependence: Secondary | ICD-10-CM | POA: Diagnosis not present

## 2020-07-29 DIAGNOSIS — Z79899 Other long term (current) drug therapy: Secondary | ICD-10-CM | POA: Insufficient documentation

## 2020-07-29 DIAGNOSIS — Z7984 Long term (current) use of oral hypoglycemic drugs: Secondary | ICD-10-CM | POA: Diagnosis not present

## 2020-07-29 DIAGNOSIS — Z818 Family history of other mental and behavioral disorders: Secondary | ICD-10-CM | POA: Diagnosis not present

## 2020-07-29 DIAGNOSIS — Z8249 Family history of ischemic heart disease and other diseases of the circulatory system: Secondary | ICD-10-CM | POA: Insufficient documentation

## 2020-07-29 DIAGNOSIS — H25811 Combined forms of age-related cataract, right eye: Secondary | ICD-10-CM | POA: Diagnosis not present

## 2020-07-29 DIAGNOSIS — H2511 Age-related nuclear cataract, right eye: Secondary | ICD-10-CM | POA: Insufficient documentation

## 2020-07-29 DIAGNOSIS — Z803 Family history of malignant neoplasm of breast: Secondary | ICD-10-CM | POA: Insufficient documentation

## 2020-07-29 DIAGNOSIS — Z881 Allergy status to other antibiotic agents status: Secondary | ICD-10-CM | POA: Diagnosis not present

## 2020-07-29 DIAGNOSIS — Z825 Family history of asthma and other chronic lower respiratory diseases: Secondary | ICD-10-CM | POA: Insufficient documentation

## 2020-07-29 DIAGNOSIS — Z801 Family history of malignant neoplasm of trachea, bronchus and lung: Secondary | ICD-10-CM | POA: Diagnosis not present

## 2020-07-29 DIAGNOSIS — Z841 Family history of disorders of kidney and ureter: Secondary | ICD-10-CM | POA: Insufficient documentation

## 2020-07-29 DIAGNOSIS — Z8261 Family history of arthritis: Secondary | ICD-10-CM | POA: Diagnosis not present

## 2020-07-29 DIAGNOSIS — E1136 Type 2 diabetes mellitus with diabetic cataract: Secondary | ICD-10-CM | POA: Diagnosis not present

## 2020-07-29 HISTORY — PX: CATARACT EXTRACTION W/PHACO: SHX586

## 2020-07-29 HISTORY — DX: Presence of dental prosthetic device (complete) (partial): Z97.2

## 2020-07-29 LAB — GLUCOSE, CAPILLARY
Glucose-Capillary: 136 mg/dL — ABNORMAL HIGH (ref 70–99)
Glucose-Capillary: 136 mg/dL — ABNORMAL HIGH (ref 70–99)

## 2020-07-29 SURGERY — PHACOEMULSIFICATION, CATARACT, WITH IOL INSERTION
Anesthesia: Monitor Anesthesia Care | Site: Eye | Laterality: Right

## 2020-07-29 MED ORDER — TETRACAINE HCL 0.5 % OP SOLN
1.0000 [drp] | OPHTHALMIC | Status: DC | PRN
  Administered 2020-07-29 (×3): 1 [drp] via OPHTHALMIC

## 2020-07-29 MED ORDER — BRIMONIDINE TARTRATE-TIMOLOL 0.2-0.5 % OP SOLN
OPHTHALMIC | Status: DC | PRN
  Administered 2020-07-29: 1 [drp] via OPHTHALMIC

## 2020-07-29 MED ORDER — LACTATED RINGERS IV SOLN: INTRAVENOUS | Status: DC

## 2020-07-29 MED ORDER — MIDAZOLAM HCL 2 MG/2ML IJ SOLN
INTRAMUSCULAR | Status: DC | PRN
  Administered 2020-07-29: 1 mg via INTRAVENOUS

## 2020-07-29 MED ORDER — FENTANYL CITRATE (PF) 100 MCG/2ML IJ SOLN
INTRAMUSCULAR | Status: DC | PRN
  Administered 2020-07-29: 50 ug via INTRAVENOUS

## 2020-07-29 MED ORDER — MOXIFLOXACIN HCL 0.5 % OP SOLN
OPHTHALMIC | Status: DC | PRN
  Administered 2020-07-29: 0.2 mL via OPHTHALMIC

## 2020-07-29 MED ORDER — EPINEPHRINE PF 1 MG/ML IJ SOLN
INTRAOCULAR | Status: DC | PRN
  Administered 2020-07-29: 59 mL via OPHTHALMIC

## 2020-07-29 MED ORDER — NA CHONDROIT SULF-NA HYALURON 40-17 MG/ML IO SOLN
INTRAOCULAR | Status: DC | PRN
  Administered 2020-07-29: 1 mL via INTRAOCULAR

## 2020-07-29 MED ORDER — ARMC OPHTHALMIC DILATING DROPS
1.0000 "application " | OPHTHALMIC | Status: DC | PRN
  Administered 2020-07-29 (×3): 1 via OPHTHALMIC

## 2020-07-29 MED ORDER — LIDOCAINE HCL (PF) 2 % IJ SOLN
INTRAOCULAR | Status: DC | PRN
  Administered 2020-07-29: 2 mL

## 2020-07-29 SURGICAL SUPPLY — 19 items
CANNULA ANT/CHMB 27G (MISCELLANEOUS) ×2 IMPLANT
CANNULA ANT/CHMB 27GA (MISCELLANEOUS) ×4 IMPLANT
GLOVE SURG LX 8.0 MICRO (GLOVE) ×1
GLOVE SURG LX STRL 8.0 MICRO (GLOVE) ×1 IMPLANT
GLOVE SURG TRIUMPH 8.0 PF LTX (GLOVE) ×2 IMPLANT
GOWN STRL REUS W/ TWL LRG LVL3 (GOWN DISPOSABLE) ×2 IMPLANT
GOWN STRL REUS W/TWL LRG LVL3 (GOWN DISPOSABLE) ×4
LENS IOL TECNIS EYHANCE 19.0 (Intraocular Lens) ×1 IMPLANT
MARKER SKIN DUAL TIP RULER LAB (MISCELLANEOUS) ×2 IMPLANT
NDL FILTER BLUNT 18X1 1/2 (NEEDLE) ×1 IMPLANT
NEEDLE FILTER BLUNT 18X 1/2SAF (NEEDLE) ×1
NEEDLE FILTER BLUNT 18X1 1/2 (NEEDLE) ×1 IMPLANT
PACK EYE AFTER SURG (MISCELLANEOUS) ×2 IMPLANT
PACK OPTHALMIC (MISCELLANEOUS) ×2 IMPLANT
PACK PORFILIO (MISCELLANEOUS) ×2 IMPLANT
SYR 3ML LL SCALE MARK (SYRINGE) ×2 IMPLANT
SYR TB 1ML LUER SLIP (SYRINGE) ×2 IMPLANT
WATER STERILE IRR 250ML POUR (IV SOLUTION) ×2 IMPLANT
WIPE NON LINTING 3.25X3.25 (MISCELLANEOUS) ×2 IMPLANT

## 2020-07-29 NOTE — Anesthesia Procedure Notes (Signed)
Procedure Name: MAC Date/Time: 07/29/2020 7:26 AM Performed by: Cameron Ali, CRNA Pre-anesthesia Checklist: Patient identified, Emergency Drugs available, Suction available, Timeout performed and Patient being monitored Patient Re-evaluated:Patient Re-evaluated prior to induction Oxygen Delivery Method: Nasal cannula Placement Confirmation: positive ETCO2

## 2020-07-29 NOTE — Anesthesia Preprocedure Evaluation (Signed)
Anesthesia Evaluation  Patient identified by MRN, date of birth, ID band Patient awake    History of Anesthesia Complications Negative for: history of anesthetic complications  Airway Mallampati: II  TM Distance: >3 FB     Dental  (+) Upper Dentures   Pulmonary neg pulmonary ROS,    Pulmonary exam normal        Cardiovascular Exercise Tolerance: Good hypertension, Normal cardiovascular exam     Neuro/Psych negative neurological ROS  negative psych ROS   GI/Hepatic negative GI ROS, Neg liver ROS,   Endo/Other  diabetes, Well Controlled, Type 2  Renal/GU negative Renal ROS     Musculoskeletal negative musculoskeletal ROS (+)   Abdominal   Peds  Hematology negative hematology ROS (+)   Anesthesia Other Findings   Reproductive/Obstetrics                            Anesthesia Physical Anesthesia Plan  ASA: II  Anesthesia Plan: MAC   Post-op Pain Management:    Induction: Intravenous  PONV Risk Score and Plan: Midazolam, TIVA and Treatment may vary due to age or medical condition  Airway Management Planned: Nasal Cannula  Additional Equipment: None  Intra-op Plan:   Post-operative Plan:   Informed Consent: I have reviewed the patients History and Physical, chart, labs and discussed the procedure including the risks, benefits and alternatives for the proposed anesthesia with the patient or authorized representative who has indicated his/her understanding and acceptance.       Plan Discussed with: CRNA  Anesthesia Plan Comments:         Anesthesia Quick Evaluation

## 2020-07-29 NOTE — H&P (Signed)
[facility]   Primary Care Physician:  Steele Sizer, MD Ophthalmologist: Dr. George Ina  Pre-Procedure History & Physical: HPI:  Adrienne Anderson is a 80 y.o. female here for cataract surgery.   Past Medical History:  Diagnosis Date  . Cataract   . Diabetes mellitus without complication ([place])   . Glaucoma   . Hyperlipidemia   . Hypertension   . Obesity   . Wears dentures    full upper    Past Surgical History:  Procedure Laterality Date  . BREAST BIOPSY Bilateral    rt/clip-02/27/03, left - all neg  . BREAST LUMPECTOMY Bilateral 1982   neg  . FINE NEEDLE ASPIRATION Left 05/20/2020   Dr. Honor Junes thyroid -  . VAGINAL HYSTERECTOMY  1986    Prior to Admission medications   Medication Sig Start Date End Date Taking? Authorizing Provider  brimonidine (ALPHAGAN) 0.15 % ophthalmic solution Place 1 drop into both eyes 2 (two) times daily. x30 days 11/21/17  Yes [provider]  Empagliflozin-metFORMIN HCl ER (SYNJARDY XR) 12.09-998 MG TB24 Take 1 tablet by mouth daily. 05/29/20  Yes Sowles, Drue Stager, MD  latanoprost (XALATAN) 0.005 % ophthalmic solution Apply to eye.   Yes [provider]  MULTIPLE VITAMIN PO Take by mouth.   Yes [provider]  rosuvastatin (CRESTOR) 40 MG tablet Take 1 tablet (40 mg total) by mouth daily. 05/29/20  Yes Sowles, Drue Stager, MD  telmisartan-hydrochlorothiazide (MICARDIS HCT) 80-12.5 MG tablet Take 1 tablet by mouth daily. 05/29/20  Yes Steele Sizer, MD  VITAMIN D PO Take by mouth daily.   Yes [provider]  glucose blood (ONE TOUCH ULTRA TEST) test strip Use as instructed Patient not taking: Reported on 05/29/2020 11/02/16   Hubbard Hartshorn, FNP    Allergies as of 06/18/2020 - Review Complete 05/29/2020  Allergen Reaction Noted  . Clindamycin/lincomycin Nausea Only 11/09/2016    Family History  Problem Relation Age of Onset  . Depression Mother   . Rheum arthritis Father   . Hypertension Sister   . Breast  cancer Sister   . Alcohol abuse Brother   . Hypertension Sister   . Heart disease Sister   . Cancer Sister        lung  . Hypertension Sister   . Kidney disease Sister   . COPD Sister   . Heart failure Sister   . Breast cancer Cousin        pat cousin  . Hypertension Sister   . Breast cancer Paternal Aunt     Social History   Socioeconomic History  . Marital status: Widowed    Spouse name: Leane Para  . Number of children: 2  . Years of education: Not on file  . Highest education level: Bachelor's degree (e.g., BA, AB, BS)  Occupational History  . Occupation: Retired  Tobacco Use  . Smoking status: Never Smoker  . Smokeless tobacco: Never Used  . Tobacco comment: smoking cessation materials not required  Vaping Use  . Vaping Use: Never used  Substance and Sexual Activity  . Alcohol use: No    Alcohol/week: 0.0 standard drinks  . Drug use: No  . Sexual activity: Not Currently    Partners: Male  Other Topics Concern  . Not on file  Social History Narrative   Widow since 2020, two children in [place] Determinants of Health   Financial Resource Strain: Low Risk   . Difficulty of Paying Living Expenses: Not hard at all  Food Insecurity: No Food Insecurity  . Worried About Charity fundraiser in the Last Year: Never true  . Ran Out of Food in the Last Year: Never true  Transportation Needs: No Transportation Needs  . Lack of Transportation (Medical): No  . Lack of Transportation (Non-Medical): No  Physical Activity: Inactive  . Days of Exercise per Week: 0 days  . Minutes of Exercise per Session: 0 min  Stress: No Stress Concern Present  . Feeling of Stress : Not at all  Social Connections: Moderately Integrated  . Frequency of Communication with Friends and Family: More than three times a week  . Frequency of Social Gatherings with Friends and Family: More than three times a week  . Attends Religious Services: More than 4 times per year  . Active  Member of Clubs or Organizations: Yes  . Attends Archivist Meetings: More than 4 times per year  . Marital Status: Widowed  Intimate Partner Violence: Not At Risk  . Fear of Current or Ex-Partner: No  . Emotionally Abused: No  . Physically Abused: No  . Sexually Abused: No    Review of Systems: See HPI, otherwise negative ROS  Physical Exam: BP (!) 127/102   Pulse 76   Temp 97.9 F (36.6 C) (Temporal)   Ht 5\' 8"  (1.727 m)   Wt 83.5 kg   SpO2 96%   BMI 27.98 kg/m  General:   Alert,  pleasant and cooperative in NAD Head:  Normocephalic and atraumatic. Respiratory:  Normal work of breathing.  Impression/Plan: Adrienne Anderson is here for cataract surgery.  Risks, benefits, limitations, and alternatives regarding cataract surgery have been reviewed with the patient.  Questions have been answered.  All parties agreeable.   Birder Robson, MD  07/29/2020, 7:17 AM

## 2020-07-29 NOTE — Transfer of Care (Signed)
Immediate Anesthesia Transfer of Care Note  Patient: Adrienne Anderson  Procedure(s) Performed: CATARACT EXTRACTION PHACO AND INTRAOCULAR LENS PLACEMENT (IOC) RIGHT DIABETIC 4.08 00:33.1 (Right Eye)  Patient Location: PACU  Anesthesia Type: MAC  Level of Consciousness: awake, alert  and patient cooperative  Airway and Oxygen Therapy: Patient Spontanous Breathing and Patient connected to supplemental oxygen  Post-op Assessment: Post-op Vital signs reviewed, Patient's Cardiovascular Status Stable, Respiratory Function Stable, Patent Airway and No signs of Nausea or vomiting  Post-op Vital Signs: Reviewed and stable  Complications: No complications documented.

## 2020-07-29 NOTE — Anesthesia Postprocedure Evaluation (Signed)
Anesthesia Post Note  Patient: Adrienne Anderson  Procedure(s) Performed: CATARACT EXTRACTION PHACO AND INTRAOCULAR LENS PLACEMENT (IOC) RIGHT DIABETIC 4.08 00:33.1 (Right Eye)     Patient location during evaluation: PACU Anesthesia Type: MAC Level of consciousness: awake and alert Pain management: pain level controlled Vital Signs Assessment: post-procedure vital signs reviewed and stable Respiratory status: spontaneous breathing Cardiovascular status: blood pressure returned to baseline Postop Assessment: no apparent nausea or vomiting, adequate PO intake and no headache Anesthetic complications: no   No complications documented.  Adele Barthel Barth Trella

## 2020-07-29 NOTE — Op Note (Signed)
PREOPERATIVE DIAGNOSIS:  Nuclear sclerotic cataract of the right eye.   POSTOPERATIVE DIAGNOSIS:  Cataract   OPERATIVE PROCEDURE:@   SURGEON:  Birder Robson, MD.   ANESTHESIA:  Anesthesiologist: Page, Adele Barthel, MD CRNA: Cameron Ali, CRNA  1.      Managed anesthesia care. 2.      0.42ml of Shugarcaine was instilled in the eye following the paracentesis.   COMPLICATIONS:  None.   TECHNIQUE:   Stop and chop   DESCRIPTION OF PROCEDURE:  The patient was examined and consented in the preoperative holding area where the aforementioned topical anesthesia was applied to the right eye and then brought back to the Operating Room where the right eye was prepped and draped in the usual sterile ophthalmic fashion and a lid speculum was placed. A paracentesis was created with the side port blade and the anterior chamber was filled with viscoelastic. A near clear corneal incision was performed with the steel keratome. A continuous curvilinear capsulorrhexis was performed with a cystotome followed by the capsulorrhexis forceps. Hydrodissection and hydrodelineation were carried out with BSS on a blunt cannula. The lens was removed in a stop and chop  technique and the remaining cortical material was removed with the irrigation-aspiration handpiece. The capsular bag was inflated with viscoelastic and the Technis [id number]  lens was placed in the capsular bag without complication. The remaining viscoelastic was removed from the eye with the irrigation-aspiration handpiece. The wounds were hydrated. The anterior chamber was flushed with BSS and the eye was inflated to physiologic pressure. 0.2ml of Vigamox was placed in the anterior chamber. The wounds were found to be water tight. The eye was dressed with Combigan. The patient was given protective glasses to wear throughout the day and a shield with which to sleep tonight. The patient was also given drops with which to begin a drop regimen today and will follow-up  with me in one day. Implant Name Type Inv. Item Serial No. Manufacturer Lot No. LRB No. Used Action  LENS IOL TECNIS EYHANCE 19.0 - [id number] Intraocular Lens LENS IOL TECNIS EYHANCE 19.0 [id number] JOHNSON   Right 1 Implanted   Procedure(s) with comments: CATARACT EXTRACTION PHACO AND INTRAOCULAR LENS PLACEMENT (IOC) RIGHT DIABETIC 4.08 00:33.1 (Right) - Diabetic - oral meds  Electronically signed: Birder Robson 07/29/2020 7:46 AM

## 2020-07-30 ENCOUNTER — Encounter: Payer: Self-pay | Admitting: Ophthalmology

## 2020-08-04 ENCOUNTER — Other Ambulatory Visit: Payer: Self-pay

## 2020-08-04 DIAGNOSIS — E1159 Type 2 diabetes mellitus with other circulatory complications: Secondary | ICD-10-CM

## 2020-08-04 DIAGNOSIS — I152 Hypertension secondary to endocrine disorders: Secondary | ICD-10-CM

## 2020-08-04 DIAGNOSIS — E1169 Type 2 diabetes mellitus with other specified complication: Secondary | ICD-10-CM

## 2020-08-04 DIAGNOSIS — H2512 Age-related nuclear cataract, left eye: Secondary | ICD-10-CM | POA: Diagnosis not present

## 2020-08-04 MED ORDER — ROSUVASTATIN CALCIUM 40 MG PO TABS: 40.0000 mg | ORAL_TABLET | Freq: Every day | ORAL | 1 refills | Status: DC

## 2020-08-04 MED ORDER — TELMISARTAN-HCTZ 80-12.5 MG PO TABS: 1.0000 | ORAL_TABLET | Freq: Every day | ORAL | 1 refills | Status: DC

## 2020-08-04 MED ORDER — SYNJARDY XR 12.5-1000 MG PO TB24: 1.0000 | ORAL_TABLET | Freq: Every day | ORAL | 1 refills | Status: DC

## 2020-08-05 ENCOUNTER — Other Ambulatory Visit: Payer: Self-pay

## 2020-08-05 ENCOUNTER — Encounter: Payer: Self-pay | Admitting: Ophthalmology

## 2020-08-07 NOTE — Discharge Instructions (Signed)

## 2020-08-08 ENCOUNTER — Other Ambulatory Visit
Admission: RE | Admit: 2020-08-08 | Discharge: 2020-08-08 | Disposition: A | Discharge status: Home / Self Care | Payer: Medicare PPO | Source: Ambulatory Visit | Attending: Ophthalmology | Admitting: Ophthalmology

## 2020-08-08 ENCOUNTER — Other Ambulatory Visit: Payer: Self-pay

## 2020-08-08 DIAGNOSIS — Z01812 Encounter for preprocedural laboratory examination: Secondary | ICD-10-CM | POA: Insufficient documentation

## 2020-08-08 DIAGNOSIS — Z20822 Contact with and (suspected) exposure to covid-19: Secondary | ICD-10-CM | POA: Insufficient documentation

## 2020-08-08 LAB — SARS CORONAVIRUS 2 (TAT 6-24 HRS): SARS Coronavirus 2: NEGATIVE

## 2020-08-12 ENCOUNTER — Other Ambulatory Visit: Payer: Self-pay

## 2020-08-12 ENCOUNTER — Ambulatory Visit: Payer: Medicare PPO | Admitting: Anesthesiology

## 2020-08-12 ENCOUNTER — Encounter: Payer: Self-pay | Admitting: Ophthalmology

## 2020-08-12 ENCOUNTER — Encounter
Admission: RE | Disposition: A | Discharge status: Home / Self Care | Payer: Self-pay | Source: Home / Self Care | Attending: Ophthalmology

## 2020-08-12 ENCOUNTER — Ambulatory Visit
Admission: RE | Admit: 2020-08-12 | Discharge: 2020-08-12 | Disposition: A | Discharge status: Home / Self Care | Payer: Medicare PPO | Attending: Ophthalmology | Admitting: Ophthalmology

## 2020-08-12 DIAGNOSIS — Z803 Family history of malignant neoplasm of breast: Secondary | ICD-10-CM | POA: Insufficient documentation

## 2020-08-12 DIAGNOSIS — Z811 Family history of alcohol abuse and dependence: Secondary | ICD-10-CM | POA: Diagnosis not present

## 2020-08-12 DIAGNOSIS — Z818 Family history of other mental and behavioral disorders: Secondary | ICD-10-CM | POA: Diagnosis not present

## 2020-08-12 DIAGNOSIS — Z79899 Other long term (current) drug therapy: Secondary | ICD-10-CM | POA: Insufficient documentation

## 2020-08-12 DIAGNOSIS — E1136 Type 2 diabetes mellitus with diabetic cataract: Secondary | ICD-10-CM | POA: Insufficient documentation

## 2020-08-12 DIAGNOSIS — Z8261 Family history of arthritis: Secondary | ICD-10-CM | POA: Diagnosis not present

## 2020-08-12 DIAGNOSIS — Z841 Family history of disorders of kidney and ureter: Secondary | ICD-10-CM | POA: Insufficient documentation

## 2020-08-12 DIAGNOSIS — Z825 Family history of asthma and other chronic lower respiratory diseases: Secondary | ICD-10-CM | POA: Diagnosis not present

## 2020-08-12 DIAGNOSIS — Z8249 Family history of ischemic heart disease and other diseases of the circulatory system: Secondary | ICD-10-CM | POA: Diagnosis not present

## 2020-08-12 DIAGNOSIS — Z881 Allergy status to other antibiotic agents status: Secondary | ICD-10-CM | POA: Diagnosis not present

## 2020-08-12 DIAGNOSIS — Z961 Presence of intraocular lens: Secondary | ICD-10-CM | POA: Insufficient documentation

## 2020-08-12 DIAGNOSIS — H25812 Combined forms of age-related cataract, left eye: Secondary | ICD-10-CM | POA: Diagnosis not present

## 2020-08-12 DIAGNOSIS — Z801 Family history of malignant neoplasm of trachea, bronchus and lung: Secondary | ICD-10-CM | POA: Diagnosis not present

## 2020-08-12 DIAGNOSIS — Z9841 Cataract extraction status, right eye: Secondary | ICD-10-CM | POA: Insufficient documentation

## 2020-08-12 DIAGNOSIS — Z7984 Long term (current) use of oral hypoglycemic drugs: Secondary | ICD-10-CM | POA: Insufficient documentation

## 2020-08-12 DIAGNOSIS — H2512 Age-related nuclear cataract, left eye: Secondary | ICD-10-CM | POA: Insufficient documentation

## 2020-08-12 DIAGNOSIS — Z792 Long term (current) use of antibiotics: Secondary | ICD-10-CM | POA: Insufficient documentation

## 2020-08-12 HISTORY — PX: CATARACT EXTRACTION W/PHACO: SHX586

## 2020-08-12 LAB — GLUCOSE, CAPILLARY
Glucose-Capillary: 111 mg/dL — ABNORMAL HIGH (ref 70–99)
Glucose-Capillary: 126 mg/dL — ABNORMAL HIGH (ref 70–99)

## 2020-08-12 SURGERY — PHACOEMULSIFICATION, CATARACT, WITH IOL INSERTION
Anesthesia: Monitor Anesthesia Care | Site: Eye | Laterality: Left

## 2020-08-12 MED ORDER — LACTATED RINGERS IV SOLN: INTRAVENOUS | Status: DC

## 2020-08-12 MED ORDER — NA CHONDROIT SULF-NA HYALURON 40-17 MG/ML IO SOLN
INTRAOCULAR | Status: DC | PRN
  Administered 2020-08-12: 1 mL via INTRAOCULAR

## 2020-08-12 MED ORDER — EPINEPHRINE PF 1 MG/ML IJ SOLN
INTRAOCULAR | Status: DC | PRN
  Administered 2020-08-12: 44 mL via OPHTHALMIC

## 2020-08-12 MED ORDER — MOXIFLOXACIN HCL 0.5 % OP SOLN
OPHTHALMIC | Status: DC | PRN
  Administered 2020-08-12: 0.2 mL via OPHTHALMIC

## 2020-08-12 MED ORDER — TETRACAINE HCL 0.5 % OP SOLN
1.0000 [drp] | OPHTHALMIC | Status: DC | PRN
  Administered 2020-08-12 (×3): 1 [drp] via OPHTHALMIC

## 2020-08-12 MED ORDER — PHENYLEPHRINE HCL 10 % OP SOLN
1.0000 [drp] | OPHTHALMIC | Status: DC | PRN
  Administered 2020-08-12 (×3): 1 [drp] via OPHTHALMIC

## 2020-08-12 MED ORDER — MIDAZOLAM HCL 2 MG/2ML IJ SOLN
INTRAMUSCULAR | Status: DC | PRN
  Administered 2020-08-12: 1 mg via INTRAVENOUS

## 2020-08-12 MED ORDER — FENTANYL CITRATE (PF) 100 MCG/2ML IJ SOLN
INTRAMUSCULAR | Status: DC | PRN
  Administered 2020-08-12: 50 ug via INTRAVENOUS

## 2020-08-12 MED ORDER — LIDOCAINE HCL (PF) 2 % IJ SOLN
INTRAOCULAR | Status: DC | PRN
  Administered 2020-08-12: 1 mL

## 2020-08-12 MED ORDER — BRIMONIDINE TARTRATE-TIMOLOL 0.2-0.5 % OP SOLN
OPHTHALMIC | Status: DC | PRN
  Administered 2020-08-12: 1 [drp] via OPHTHALMIC

## 2020-08-12 MED ORDER — CYCLOPENTOLATE HCL 2 % OP SOLN
1.0000 [drp] | OPHTHALMIC | Status: DC | PRN
  Administered 2020-08-12 (×3): 1 [drp] via OPHTHALMIC

## 2020-08-12 SURGICAL SUPPLY — 16 items
CANNULA ANT/CHMB 27G (MISCELLANEOUS) ×2 IMPLANT
CANNULA ANT/CHMB 27GA (MISCELLANEOUS) ×4 IMPLANT
GLOVE SURG TRIUMPH 8.0 PF LTX (GLOVE) ×2 IMPLANT
GOWN STRL REUS W/ TWL LRG LVL3 (GOWN DISPOSABLE) ×2 IMPLANT
GOWN STRL REUS W/TWL LRG LVL3 (GOWN DISPOSABLE) ×4
LENS IOL TECNIS EYHANCE 18.5 (Intraocular Lens) ×1 IMPLANT
MARKER SKIN DUAL TIP RULER LAB (MISCELLANEOUS) ×2 IMPLANT
NDL FILTER BLUNT 18X1 1/2 (NEEDLE) ×1 IMPLANT
NEEDLE FILTER BLUNT 18X 1/2SAF (NEEDLE) ×1
NEEDLE FILTER BLUNT 18X1 1/2 (NEEDLE) ×1 IMPLANT
PACK EYE AFTER SURG (MISCELLANEOUS) ×2 IMPLANT
PACK OPTHALMIC (MISCELLANEOUS) ×2 IMPLANT
PACK PORFILIO (MISCELLANEOUS) ×2 IMPLANT
SYR TB 1ML LUER SLIP (SYRINGE) ×2 IMPLANT
WATER STERILE IRR 250ML POUR (IV SOLUTION) ×2 IMPLANT
WIPE NON LINTING 3.25X3.25 (MISCELLANEOUS) ×2 IMPLANT

## 2020-08-12 NOTE — Anesthesia Postprocedure Evaluation (Signed)
Anesthesia Post Note  Patient: Adrienne Anderson  Procedure(s) Performed: CATARACT EXTRACTION PHACO AND INTRAOCULAR LENS PLACEMENT (IOC) LEFT DIABETIC 5.32 00:32.2 (Left Eye)     Patient location during evaluation: PACU Anesthesia Type: MAC Level of consciousness: awake and alert Pain management: pain level controlled Vital Signs Assessment: post-procedure vital signs reviewed and stable Respiratory status: spontaneous breathing, nonlabored ventilation, respiratory function stable and patient connected to nasal cannula oxygen Cardiovascular status: stable and blood pressure returned to baseline Postop Assessment: no apparent nausea or vomiting Anesthetic complications: no   No complications documented.  Wanda Plump Skylynne Schlechter

## 2020-08-12 NOTE — Anesthesia Procedure Notes (Signed)
Procedure Name: MAC Date/Time: 08/12/2020 7:29 AM Performed by: Cameron Ali, CRNA Pre-anesthesia Checklist: Patient identified, Emergency Drugs available, Suction available, Timeout performed and Patient being monitored Patient Re-evaluated:Patient Re-evaluated prior to induction Oxygen Delivery Method: Nasal cannula Placement Confirmation: positive ETCO2

## 2020-08-12 NOTE — Anesthesia Preprocedure Evaluation (Signed)
Anesthesia Evaluation  Patient identified by MRN, date of birth, ID band Patient awake    Reviewed: Allergy & Precautions, NPO status , Patient's Chart, lab work & pertinent test results  History of Anesthesia Complications Negative for: history of anesthetic complications  Airway Mallampati: II  TM Distance: >3 FB Neck ROM: Full    Dental   Pulmonary neg pulmonary ROS,    Pulmonary exam normal        Cardiovascular hypertension, Normal cardiovascular exam     Neuro/Psych    GI/Hepatic   Endo/Other  diabetes  Renal/GU      Musculoskeletal   Abdominal   Peds  Hematology   Anesthesia Other Findings   Reproductive/Obstetrics                             Anesthesia Physical Anesthesia Plan  ASA: II  Anesthesia Plan: MAC   Post-op Pain Management:    Induction: Intravenous  PONV Risk Score and Plan:   Airway Management Planned: Nasal Cannula  Additional Equipment:   Intra-op Plan:   Post-operative Plan:   Informed Consent: I have reviewed the patients History and Physical, chart, labs and discussed the procedure including the risks, benefits and alternatives for the proposed anesthesia with the patient or authorized representative who has indicated his/her understanding and acceptance.       Plan Discussed with: CRNA  Anesthesia Plan Comments:         Anesthesia Quick Evaluation

## 2020-08-12 NOTE — Transfer of Care (Signed)
Immediate Anesthesia Transfer of Care Note  Patient: Adrienne Anderson  Procedure(s) Performed: CATARACT EXTRACTION PHACO AND INTRAOCULAR LENS PLACEMENT (IOC) LEFT DIABETIC 5.32 00:32.2 (Left Eye)  Patient Location: PACU  Anesthesia Type: MAC  Level of Consciousness: awake, alert  and patient cooperative  Airway and Oxygen Therapy: Patient Spontanous Breathing and Patient connected to supplemental oxygen  Post-op Assessment: Post-op Vital signs reviewed, Patient's Cardiovascular Status Stable, Respiratory Function Stable, Patent Airway and No signs of Nausea or vomiting  Post-op Vital Signs: Reviewed and stable  Complications: No complications documented.

## 2020-08-12 NOTE — H&P (Signed)
[facility]   Primary Care Physician:  Steele Sizer, MD Ophthalmologist: Dr. George Ina  Pre-Procedure History & Physical: HPI:  Adrienne Anderson is a 80 y.o. female here for cataract surgery.   Past Medical History:  Diagnosis Date  . Cataract   . Diabetes mellitus without complication ([place])   . Glaucoma   . Hyperlipidemia   . Hypertension   . Obesity   . Wears dentures    full upper    Past Surgical History:  Procedure Laterality Date  . BREAST BIOPSY Bilateral    rt/clip-02/27/03, left - all neg  . BREAST LUMPECTOMY Bilateral 1982   neg  . CATARACT EXTRACTION W/PHACO Right 07/29/2020   Procedure: CATARACT EXTRACTION PHACO AND INTRAOCULAR LENS PLACEMENT (IOC) RIGHT DIABETIC 4.08 00:33.1;  Surgeon: Birder Robson, MD;  Location: [place];  Service: Ophthalmology;  Laterality: Right;  Diabetic - oral meds  . FINE NEEDLE ASPIRATION Left 05/20/2020   Dr. Honor Junes thyroid -  . VAGINAL HYSTERECTOMY  1986    Prior to Admission medications   Medication Sig Start Date End Date Taking? Authorizing Provider  brimonidine (ALPHAGAN) 0.15 % ophthalmic solution Place 1 drop into both eyes 2 (two) times daily. x30 days 11/21/17  Yes [provider]  Empagliflozin-metFORMIN HCl ER (SYNJARDY XR) 12.09-998 MG TB24 Take 1 tablet by mouth daily. 08/04/20  Yes Sowles, Drue Stager, MD  latanoprost (XALATAN) 0.005 % ophthalmic solution Apply to eye.   Yes [provider]  MULTIPLE VITAMIN PO Take by mouth.   Yes [provider]  rosuvastatin (CRESTOR) 40 MG tablet Take 1 tablet (40 mg total) by mouth daily. 08/04/20  Yes Sowles, Drue Stager, MD  telmisartan-hydrochlorothiazide (MICARDIS HCT) 80-12.5 MG tablet Take 1 tablet by mouth daily. 08/04/20  Yes Steele Sizer, MD  VITAMIN D PO Take by mouth daily.   Yes [provider]  glucose blood (ONE TOUCH ULTRA TEST) test strip Use as instructed Patient not taking: Reported on 05/29/2020 11/02/16   Hubbard Hartshorn, FNP    Allergies as of 06/18/2020 - Review Complete 05/29/2020  Allergen Reaction Noted  . Clindamycin/lincomycin Nausea Only 11/09/2016    Family History  Problem Relation Age of Onset  . Depression Mother   . Rheum arthritis Father   . Hypertension Sister   . Breast cancer Sister   . Alcohol abuse Brother   . Hypertension Sister   . Heart disease Sister   . Cancer Sister        lung  . Hypertension Sister   . Kidney disease Sister   . COPD Sister   . Heart failure Sister   . Breast cancer Cousin        pat cousin  . Hypertension Sister   . Breast cancer Paternal Aunt     Social History   Socioeconomic History  . Marital status: Widowed    Spouse name: Leane Para  . Number of children: 2  . Years of education: Not on file  . Highest education level: Bachelor's degree (e.g., BA, AB, BS)  Occupational History  . Occupation: Retired  Tobacco Use  . Smoking status: Never Smoker  . Smokeless tobacco: Never Used  . Tobacco comment: smoking cessation materials not required  Vaping Use  . Vaping Use: Never used  Substance and Sexual Activity  . Alcohol use: No    Alcohol/week: 0.0 standard drinks  . Drug use: No  . Sexual activity: Not Currently    Partners: Male  Other Topics Concern  .  Not on file  Social History Narrative   Widow since 2020, two children in [place] Determinants of Health   Financial Resource Strain: Low Risk   . Difficulty of Paying Living Expenses: Not hard at all  Food Insecurity: No Food Insecurity  . Worried About Charity fundraiser in the Last Year: Never true  . Ran Out of Food in the Last Year: Never true  Transportation Needs: No Transportation Needs  . Lack of Transportation (Medical): No  . Lack of Transportation (Non-Medical): No  Physical Activity: Inactive  . Days of Exercise per Week: 0 days  . Minutes of Exercise per Session: 0 min  Stress: No Stress Concern Present  . Feeling of Stress : Not at all   Social Connections: Moderately Integrated  . Frequency of Communication with Friends and Family: More than three times a week  . Frequency of Social Gatherings with Friends and Family: More than three times a week  . Attends Religious Services: More than 4 times per year  . Active Member of Clubs or Organizations: Yes  . Attends Archivist Meetings: More than 4 times per year  . Marital Status: Widowed  Intimate Partner Violence: Not At Risk  . Fear of Current or Ex-Partner: No  . Emotionally Abused: No  . Physically Abused: No  . Sexually Abused: No    Review of Systems: See HPI, otherwise negative ROS  Physical Exam: BP (!) 142/80   Pulse 77   Temp (!) 97 F (36.1 C) (Temporal)   Resp 16   Ht 5\' 8"  (1.727 m)   Wt 83.9 kg   SpO2 96%   BMI 28.13 kg/m  General:   Alert,  pleasant and cooperative in NAD Head:  Normocephalic and atraumatic. Respiratory:  Normal work of breathing.  Impression/Plan: Adrienne Anderson is here for cataract surgery.  Risks, benefits, limitations, and alternatives regarding cataract surgery have been reviewed with the patient.  Questions have been answered.  All parties agreeable.   Birder Robson, MD  08/12/2020, 7:24 AM

## 2020-08-12 NOTE — Op Note (Signed)
PREOPERATIVE DIAGNOSIS:  Nuclear sclerotic cataract of the left eye.   POSTOPERATIVE DIAGNOSIS:  Nuclear sclerotic cataract of the left eye.   OPERATIVE PROCEDURE:@   SURGEON:  Birder Robson, MD.   ANESTHESIA:  Anesthesiologist: Carlos American, MD CRNA: Cameron Ali, CRNA  1.      Managed anesthesia care. 2.     0.14ml of Shugarcaine was instilled following the paracentesis   COMPLICATIONS:  None.   TECHNIQUE:   Stop and chop   DESCRIPTION OF PROCEDURE:  The patient was examined and consented in the preoperative holding area where the aforementioned topical anesthesia was applied to the left eye and then brought back to the Operating Room where the left eye was prepped and draped in the usual sterile ophthalmic fashion and a lid speculum was placed. A paracentesis was created with the side port blade and the anterior chamber was filled with viscoelastic. A near clear corneal incision was performed with the steel keratome. A continuous curvilinear capsulorrhexis was performed with a cystotome followed by the capsulorrhexis forceps. Hydrodissection and hydrodelineation were carried out with BSS on a blunt cannula. The lens was removed in a stop and chop  technique and the remaining cortical material was removed with the irrigation-aspiration handpiece. The capsular bag was inflated with viscoelastic and the Technis [id number] lens was placed in the capsular bag without complication. The remaining viscoelastic was removed from the eye with the irrigation-aspiration handpiece. The wounds were hydrated. The anterior chamber was flushed with BSS and the eye was inflated to physiologic pressure. 0.78ml Vigamox was placed in the anterior chamber. The wounds were found to be water tight. The eye was dressed with Combigan. The patient was given protective glasses to wear throughout the day and a shield with which to sleep tonight. The patient was also given drops with which to begin a drop regimen today  and will follow-up with me in one day. Implant Name Type Inv. Item Serial No. Manufacturer Lot No. LRB No. Used Action  LENS IOL TECNIS EYHANCE 18.5 - [id number] Intraocular Lens LENS IOL TECNIS EYHANCE 18.5 [id number] JOHNSON   Left 1 Implanted    Procedure(s) with comments: CATARACT EXTRACTION PHACO AND INTRAOCULAR LENS PLACEMENT (IOC) LEFT DIABETIC 5.32 00:32.2 (Left) - Diabetic - oral meds  Electronically signed: Birder Robson 08/12/2020 7:47 AM

## 2020-08-13 ENCOUNTER — Encounter: Payer: Self-pay | Admitting: Ophthalmology

## 2020-08-26 NOTE — Progress Notes (Signed)
Name: Adrienne Anderson   MRN: [id number]    DOB: August 28, 1940   Date:08/27/2020       Progress Note  Subjective  Chief Complaint  Vaginal itching   HPI  Recurrent vulva/vaginal itching: she is not sexually active, she has noticed increase in vaginal itching over the past 4-6 months. She denies vaginal discharge. She has also noticed increase in urinary urgency, worse when she first stands up from sitting and has a panty liner that gets wet.  Discussed SGL2 agonist and also moisture can increase yeast infection. Also discussed avoiding caffeine and sweets.  She denies dysuria, hematuria, sometimes urine looks foamy but last urine micro was negative  Patient Active Problem List   Diagnosis Date Noted  . Sensorineural hearing loss of combined sites, bilateral 11/19/2015  . Glaucoma 08/24/2014  . Cataract 08/24/2014  . Dyslipidemia associated with type 2 diabetes mellitus ([place]) 08/24/2014  . Hypertension associated with diabetes ([place]) 08/24/2014  . Dyslipidemia 08/24/2014    Past Surgical History:  Procedure Laterality Date  . BREAST BIOPSY Bilateral    rt/clip-02/27/03, left - all neg  . BREAST LUMPECTOMY Bilateral 1982   neg  . CATARACT EXTRACTION W/PHACO Right 07/29/2020   Procedure: CATARACT EXTRACTION PHACO AND INTRAOCULAR LENS PLACEMENT (IOC) RIGHT DIABETIC 4.08 00:33.1;  Surgeon: Birder Robson, MD;  Location: [facility];  Service: Ophthalmology;  Laterality: Right;  Diabetic - oral meds  . CATARACT EXTRACTION W/PHACO Left 08/12/2020   Procedure: CATARACT EXTRACTION PHACO AND INTRAOCULAR LENS PLACEMENT (IOC) LEFT DIABETIC 5.32 00:32.2;  Surgeon: Birder Robson, MD;  Location: [facility];  Service: Ophthalmology;  Laterality: Left;  Diabetic - oral meds  . FINE NEEDLE ASPIRATION Left 05/20/2020   Dr. Honor Junes thyroid -  . VAGINAL HYSTERECTOMY  1986    Family History  Problem Relation Age of Onset  . Depression Mother   . Rheum arthritis Father   .  Hypertension Sister   . Breast cancer Sister   . Alcohol abuse Brother   . Hypertension Sister   . Heart disease Sister   . Cancer Sister        lung  . Hypertension Sister   . Kidney disease Sister   . COPD Sister   . Heart failure Sister   . Breast cancer Cousin        pat cousin  . Hypertension Sister   . Breast cancer Paternal Aunt     Social History   Tobacco Use  . Smoking status: Never Smoker  . Smokeless tobacco: Never Used  . Tobacco comment: smoking cessation materials not required  Substance Use Topics  . Alcohol use: No    Alcohol/week: 0.0 standard drinks     Current Outpatient Medications:  .  brimonidine (ALPHAGAN) 0.15 % ophthalmic solution, Place 1 drop into both eyes 2 (two) times daily. x30 days, Disp: , Rfl: 5 .  Empagliflozin-metFORMIN HCl ER (SYNJARDY XR) 12.09-998 MG TB24, Take 1 tablet by mouth daily., Disp: 90 tablet, Rfl: 1 .  fluconazole (DIFLUCAN) 150 MG tablet, Take 1 tablet (150 mg total) by mouth every other day., Disp: 3 tablet, Rfl: 0 .  latanoprost (XALATAN) 0.005 % ophthalmic solution, Apply to eye., Disp: , Rfl:  .  MULTIPLE VITAMIN PO, Take by mouth., Disp: , Rfl:  .  rosuvastatin (CRESTOR) 40 MG tablet, Take 1 tablet (40 mg total) by mouth daily., Disp: 90 tablet, Rfl: 1 .  telmisartan-hydrochlorothiazide (MICARDIS HCT) 80-12.5 MG tablet, Take 1 tablet by mouth daily., Disp:  90 tablet, Rfl: 1 .  VITAMIN D PO, Take by mouth daily., Disp: , Rfl:  .  glucose blood (ONE TOUCH ULTRA TEST) test strip, Use as instructed (Patient not taking: No sig reported), Disp: 100 each, Rfl: 12  Allergies  Allergen Reactions  . Clindamycin/Lincomycin Nausea Only    I personally reviewed active problem list, medication list, allergies, family history, social history, health maintenance with the patient/caregiver today.   ROS  Ten systems reviewed and is negative except as mentioned in HPI   Objective  Vitals:   08/27/20 1403  BP: 120/82   Pulse: 86  Resp: 16  Temp: 98.2 F (36.8 C)  TempSrc: Oral  SpO2: 95%  Weight: 186 lb (84.4 kg)  Height: 5\' 9"  (1.753 m)    Body mass index is 27.47 kg/m.  Physical Exam  Constitutional: Patient appears well-developed and well-nourished. Obese  No distress.  HEENT: head atraumatic, normocephalic, pupils equal and reactive to light,  neck supple Cardiovascular: Normal rate, regular rhythm and normal heart sounds.  No murmur heard. No BLE edema. Pulmonary/Chest: Effort normal and breath sounds normal. No respiratory distress. Abdominal: Soft.  There is no tenderness. Negative CVA  Psychiatric: Patient has a normal mood and affect. behavior is normal. Judgment and thought content normal.  Recent Results (from the past 2160 hour(s))  SARS CORONAVIRUS 2 (TAT 6-24 HRS) Nasopharyngeal Nasopharyngeal Swab     Status: None   Collection Time: 07/25/20 10:46 AM   Specimen: Nasopharyngeal Swab  Result Value Ref Range   SARS Coronavirus 2 NEGATIVE NEGATIVE    Comment: (NOTE) SARS-CoV-2 target nucleic acids are NOT DETECTED.  The SARS-CoV-2 RNA is generally detectable in upper and lower respiratory specimens during the acute phase of infection. Negative results do not preclude SARS-CoV-2 infection, do not rule out co-infections with other pathogens, and should not be used as the sole basis for treatment or other patient management decisions. Negative results must be combined with clinical observations, patient history, and epidemiological information. The expected result is Negative.  Fact Sheet for Patients: SugarRoll.be  Fact Sheet for Healthcare Providers: https://www.woods-mathews.com/  This test is not yet approved or cleared by the [place] FDA and  has been authorized for detection and/or diagnosis of SARS-CoV-2 by FDA under an Emergency Use Authorization (EUA). This EUA will remain  in effect (meaning this test can be used) for  the duration of the COVID-19 declaration under Se ction 564(b)(1) of the Act, 21 U.S.C. section 360bbb-3(b)(1), unless the authorization is terminated or revoked sooner.  Performed at [facility], [place] [address] [place], [place] [address]   Glucose, capillary     Status: Abnormal   Collection Time: 07/29/20  6:43 AM  Result Value Ref Range   Glucose-Capillary 136 (H) 70 - 99 mg/dL    Comment: Glucose reference range applies only to samples taken after fasting for at least 8 hours.  Glucose, capillary     Status: Abnormal   Collection Time: 07/29/20  7:48 AM  Result Value Ref Range   Glucose-Capillary 136 (H) 70 - 99 mg/dL    Comment: Glucose reference range applies only to samples taken after fasting for at least 8 hours.  SARS CORONAVIRUS 2 (TAT 6-24 HRS) Nasopharyngeal Nasopharyngeal Swab     Status: None   Collection Time: 08/08/20  8:50 AM   Specimen: Nasopharyngeal Swab  Result Value Ref Range   SARS Coronavirus 2 NEGATIVE NEGATIVE    Comment: (NOTE) SARS-CoV-2 target nucleic acids are  NOT DETECTED.  The SARS-CoV-2 RNA is generally detectable in upper and lower respiratory specimens during the acute phase of infection. Negative results do not preclude SARS-CoV-2 infection, do not rule out co-infections with other pathogens, and should not be used as the sole basis for treatment or other patient management decisions. Negative results must be combined with clinical observations, patient history, and epidemiological information. The expected result is Negative.  Fact Sheet for Patients: SugarRoll.be  Fact Sheet for Healthcare Providers: https://www.woods-mathews.com/  This test is not yet approved or cleared by the [place] FDA and  has been authorized for detection and/or diagnosis of SARS-CoV-2 by FDA under an Emergency Use Authorization (EUA). This EUA will remain  in effect (meaning this test can be used) for the  duration of the COVID-19 declaration under Se ction 564(b)(1) of the Act, 21 U.S.C. section 360bbb-3(b)(1), unless the authorization is terminated or revoked sooner.  Performed at [facility], [place] [address] [place], [place] [address]   Glucose, capillary     Status: Abnormal   Collection Time: 08/12/20  6:41 AM  Result Value Ref Range   Glucose-Capillary 111 (H) 70 - 99 mg/dL    Comment: Glucose reference range applies only to samples taken after fasting for at least 8 hours.  Glucose, capillary     Status: Abnormal   Collection Time: 08/12/20  7:49 AM  Result Value Ref Range   Glucose-Capillary 126 (H) 70 - 99 mg/dL    Comment: Glucose reference range applies only to samples taken after fasting for at least 8 hours.      PHQ2/9: Depression screen [facility] 2/9 08/27/2020 05/29/2020 03/21/2020 03/10/2020 12/27/2019  Decreased Interest 0 0 0 0 0  Down, Depressed, Hopeless 0 0 0 0 0  PHQ - 2 Score 0 0 0 0 0  Altered sleeping - - - - -  Tired, decreased energy - - - - -  Change in appetite - - - - -  Feeling bad or failure about yourself  - - - - -  Trouble concentrating - - - - -  Moving slowly or fidgety/restless - - - - -  Suicidal thoughts - - - - -  PHQ-9 Score - - - - -  Difficult doing work/chores - - - - -  Some recent data might be hidden    phq 9 is negative   Fall Risk: Fall Risk  08/27/2020 05/29/2020 04/07/2020 03/21/2020 03/10/2020  Falls in the past year? 0 0 0 0 0  Comment - - - - -  Number falls in past yr: 0 0 0 0 0  Injury with Fall? 0 0 0 0 0  Comment - - - - -  Risk for fall due to : - - - - -  Risk for fall due to: Comment - - - - -  Follow up - Falls evaluation completed - - -    Assessment & Plan  1. Vaginal itching  - Cervicovaginal ancillary only - fluconazole (DIFLUCAN) 150 MG tablet; Take 1 tablet (150 mg total) by mouth every other day.  Dispense: 3 tablet; Refill: 0  2. Dyslipidemia associated with type 2 diabetes mellitus  ([place])  Explained SGL2 agonist may be the cause of her recurrent itching  3. High risk medication use  - fluconazole (DIFLUCAN) 150 MG tablet; Take 1 tablet (150 mg total) by mouth every other day.  Dispense: 3 tablet; Refill: 0  4. Other urinary incontinence  Discussed referral urologist  but she would like to hold off for now  Urine culture today

## 2020-08-27 ENCOUNTER — Other Ambulatory Visit (HOSPITAL_COMMUNITY)
Admission: RE | Admit: 2020-08-27 | Discharge: 2020-08-27 | Disposition: A | Discharge status: Home / Self Care | Payer: Medicare PPO | Source: Ambulatory Visit | Attending: Family Medicine | Admitting: Family Medicine

## 2020-08-27 ENCOUNTER — Encounter: Payer: Self-pay | Admitting: Family Medicine

## 2020-08-27 ENCOUNTER — Other Ambulatory Visit: Payer: Self-pay

## 2020-08-27 ENCOUNTER — Ambulatory Visit (INDEPENDENT_AMBULATORY_CARE_PROVIDER_SITE_OTHER): Payer: Medicare PPO | Admitting: Family Medicine

## 2020-08-27 VITALS — BP 120/82 | HR 86 | Temp 98.2°F | Resp 16 | Ht 69.0 in | Wt 186.0 lb

## 2020-08-27 DIAGNOSIS — N39498 Other specified urinary incontinence: Secondary | ICD-10-CM

## 2020-08-27 DIAGNOSIS — N898 Other specified noninflammatory disorders of vagina: Secondary | ICD-10-CM

## 2020-08-27 DIAGNOSIS — Z79899 Other long term (current) drug therapy: Secondary | ICD-10-CM | POA: Diagnosis not present

## 2020-08-27 DIAGNOSIS — E1169 Type 2 diabetes mellitus with other specified complication: Secondary | ICD-10-CM

## 2020-08-27 DIAGNOSIS — E785 Hyperlipidemia, unspecified: Secondary | ICD-10-CM | POA: Diagnosis not present

## 2020-08-27 MED ORDER — FLUCONAZOLE 150 MG PO TABS: 150.0000 mg | ORAL_TABLET | ORAL | 2 refills | Status: DC

## 2020-08-27 MED ORDER — FLUCONAZOLE 150 MG PO TABS: 150.0000 mg | ORAL_TABLET | ORAL | 0 refills | Status: DC

## 2020-08-29 LAB — CERVICOVAGINAL ANCILLARY ONLY
Bacterial Vaginitis (gardnerella): NEGATIVE
Candida Glabrata: NEGATIVE
Candida Vaginitis: POSITIVE — AB
Comment: NEGATIVE
Comment: NEGATIVE
Comment: NEGATIVE
Comment: NEGATIVE
Trichomonas: NEGATIVE

## 2020-08-29 LAB — CULTURE, URINE COMPREHENSIVE
MICRO NUMBER:: 11741656
SPECIMEN QUALITY:: ADEQUATE

## 2020-10-14 ENCOUNTER — Other Ambulatory Visit: Payer: Self-pay | Admitting: Family Medicine

## 2020-10-14 DIAGNOSIS — Z1231 Encounter for screening mammogram for malignant neoplasm of breast: Secondary | ICD-10-CM

## 2020-11-27 ENCOUNTER — Ambulatory Visit: Payer: Medicare PPO | Admitting: Family Medicine

## 2020-11-27 ENCOUNTER — Ambulatory Visit
Admission: RE | Admit: 2020-11-27 | Discharge: 2020-11-27 | Disposition: A | Discharge status: Home / Self Care | Payer: Medicare PPO | Source: Ambulatory Visit | Attending: Family Medicine | Admitting: Family Medicine

## 2020-11-27 ENCOUNTER — Other Ambulatory Visit: Payer: Self-pay

## 2020-11-27 DIAGNOSIS — Z1231 Encounter for screening mammogram for malignant neoplasm of breast: Secondary | ICD-10-CM | POA: Diagnosis not present
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
## 2020-12-05 ENCOUNTER — Other Ambulatory Visit: Payer: Self-pay

## 2020-12-05 ENCOUNTER — Encounter: Payer: Self-pay | Admitting: Family Medicine

## 2020-12-05 ENCOUNTER — Ambulatory Visit: Payer: Medicare PPO | Admitting: Family Medicine

## 2020-12-05 VITALS — BP 126/66 | HR 84 | Temp 98.1°F | Resp 16 | Ht 69.0 in | Wt 186.0 lb

## 2020-12-05 DIAGNOSIS — E785 Hyperlipidemia, unspecified: Secondary | ICD-10-CM

## 2020-12-05 DIAGNOSIS — I10 Essential (primary) hypertension: Secondary | ICD-10-CM

## 2020-12-05 DIAGNOSIS — I152 Hypertension secondary to endocrine disorders: Secondary | ICD-10-CM | POA: Diagnosis not present

## 2020-12-05 DIAGNOSIS — D72819 Decreased white blood cell count, unspecified: Secondary | ICD-10-CM | POA: Diagnosis not present

## 2020-12-05 DIAGNOSIS — E1159 Type 2 diabetes mellitus with other circulatory complications: Secondary | ICD-10-CM

## 2020-12-05 DIAGNOSIS — E1169 Type 2 diabetes mellitus with other specified complication: Secondary | ICD-10-CM

## 2020-12-05 DIAGNOSIS — M8588 Other specified disorders of bone density and structure, other site: Secondary | ICD-10-CM

## 2020-12-05 LAB — POCT GLYCOSYLATED HEMOGLOBIN (HGB A1C): Hemoglobin A1C: 6.7 % — AB (ref 4.0–5.6)

## 2020-12-05 MED ORDER — TELMISARTAN-HCTZ 80-12.5 MG PO TABS: 1.0000 | ORAL_TABLET | Freq: Every day | ORAL | 1 refills | Status: DC

## 2020-12-05 MED ORDER — ROSUVASTATIN CALCIUM 40 MG PO TABS: 40.0000 mg | ORAL_TABLET | Freq: Every day | ORAL | 1 refills | Status: DC

## 2020-12-05 MED ORDER — SYNJARDY XR 12.5-1000 MG PO TB24
1.0000 | ORAL_TABLET | Freq: Every day | ORAL | 1 refills | Status: DC
Start: 2020-12-05 — End: 2020-12-05

## 2020-12-05 MED ORDER — SYNJARDY XR 12.5-1000 MG PO TB24: 1.0000 | ORAL_TABLET | Freq: Every day | ORAL | 1 refills | Status: DC

## 2020-12-30 ENCOUNTER — Other Ambulatory Visit: Payer: Self-pay

## 2020-12-30 ENCOUNTER — Ambulatory Visit (INDEPENDENT_AMBULATORY_CARE_PROVIDER_SITE_OTHER): Payer: Medicare PPO

## 2020-12-30 VITALS — BP 130/78 | HR 88 | Temp 98.3°F | Resp 16 | Ht 69.0 in | Wt 182.9 lb

## 2020-12-30 DIAGNOSIS — Z0000 Encounter for general adult medical examination without abnormal findings: Secondary | ICD-10-CM

## 2020-12-30 NOTE — Patient Instructions (Signed)
Adrienne Anderson , Thank you for taking time to come for your Medicare Wellness Visit. I appreciate your ongoing commitment to your health goals. Please review the following plan we discussed and let me know if I can assist you in the future.   Screening recommendations/referrals: Colonoscopy: no longer required Mammogram: done 11/27/20 Bone Density: done 02/13/18 Recommended yearly ophthalmology/optometry visit for glaucoma screening and checkup Recommended yearly dental visit for hygiene and checkup  Vaccinations: Influenza vaccine: done 03/07/20 Pneumococcal vaccine: done 10/30/14 Tdap vaccine: done 03/05/20 Shingles vaccine: done 02/02/18 & 04/04/18   Covid-19: done 06/29/19, 07/27/19, 03/19/20 & 09/10/20  Advanced directives: Please bring a copy of your health care power of attorney and living will to the office at your convenience.   Conditions/risks identified: Keep up the great work!  Next appointment: Follow up in one year for your annual wellness visit    Preventive Care 65 Years and Older, Female Preventive care refers to lifestyle choices and visits with your health care provider that can promote health and wellness. What does preventive care include? A yearly physical exam. This is also called an annual well check. Dental exams once or twice a year. Routine eye exams. Ask your health care provider how often you should have your eyes checked. Personal lifestyle choices, including: Daily care of your teeth and gums. Regular physical activity. Eating a healthy diet. Avoiding tobacco and drug use. Limiting alcohol use. Practicing safe sex. Taking low-dose aspirin every day. Taking vitamin and mineral supplements as recommended by your health care provider. What happens during an annual well check? The services and screenings done by your health care provider during your annual well check will depend on your age, overall health, lifestyle risk factors, and family history of  disease. Counseling  Your health care provider may ask you questions about your: Alcohol use. Tobacco use. Drug use. Emotional well-being. Home and relationship well-being. Sexual activity. Eating habits. History of falls. Memory and ability to understand (cognition). Work and work Statistician. Reproductive health. Screening  You may have the following tests or measurements: Height, weight, and BMI. Blood pressure. Lipid and cholesterol levels. These may be checked every 5 years, or more frequently if you are over 77 years old. Skin check. Lung cancer screening. You may have this screening every year starting at age 45 if you have a 30-pack-year history of smoking and currently smoke or have quit within the past 15 years. Fecal occult blood test (FOBT) of the stool. You may have this test every year starting at age 27. Flexible sigmoidoscopy or colonoscopy. You may have a sigmoidoscopy every 5 years or a colonoscopy every 10 years starting at age 71. Hepatitis C blood test. Hepatitis B blood test. Sexually transmitted disease (STD) testing. Diabetes screening. This is done by checking your blood sugar (glucose) after you have not eaten for a while (fasting). You may have this done every 1-3 years. Bone density scan. This is done to screen for osteoporosis. You may have this done starting at age 15. Mammogram. This may be done every 1-2 years. Talk to your health care provider about how often you should have regular mammograms. Talk with your health care provider about your test results, treatment options, and if necessary, the need for more tests. Vaccines  Your health care provider may recommend certain vaccines, such as: Influenza vaccine. This is recommended every year. Tetanus, diphtheria, and acellular pertussis (Tdap, Td) vaccine. You may need a Td booster every 10 years. Zoster vaccine. You may  need this after age 17. Pneumococcal 13-valent conjugate (PCV13) vaccine. One  dose is recommended after age 5. Pneumococcal polysaccharide (PPSV23) vaccine. One dose is recommended after age 77. Talk to your health care provider about which screenings and vaccines you need and how often you need them. This information is not intended to replace advice given to you by your health care provider. Make sure you discuss any questions you have with your health care provider. Document Released: 06/06/2015 Document Revised: 01/28/2016 Document Reviewed: 03/11/2015 Elsevier Interactive Patient Education  2017 [place] Prevention in the Home Falls can cause injuries. They can happen to people of all ages. There are many things you can do to make your home safe and to help prevent falls. What can I do on the outside of my home? Regularly fix the edges of walkways and driveways and fix any cracks. Remove anything that might make you trip as you walk through a door, such as a raised step or threshold. Trim any bushes or trees on the path to your home. Use bright outdoor lighting. Clear any walking paths of anything that might make someone trip, such as rocks or tools. Regularly check to see if handrails are loose or broken. Make sure that both sides of any steps have handrails. Any raised decks and porches should have guardrails on the edges. Have any leaves, snow, or ice cleared regularly. Use sand or salt on walking paths during winter. Clean up any spills in your garage right away. This includes oil or grease spills. What can I do in the bathroom? Use night lights. Install grab bars by the toilet and in the tub and shower. Do not use towel bars as grab bars. Use non-skid mats or decals in the tub or shower. If you need to sit down in the shower, use a plastic, non-slip stool. Keep the floor dry. Clean up any water that spills on the floor as soon as it happens. Remove soap buildup in the tub or shower regularly. Attach bath mats securely with double-sided  non-slip rug tape. Do not have throw rugs and other things on the floor that can make you trip. What can I do in the bedroom? Use night lights. Make sure that you have a light by your bed that is easy to reach. Do not use any sheets or blankets that are too big for your bed. They should not hang down onto the floor. Have a firm chair that has side arms. You can use this for support while you get dressed. Do not have throw rugs and other things on the floor that can make you trip. What can I do in the kitchen? Clean up any spills right away. Avoid walking on wet floors. Keep items that you use a lot in easy-to-reach places. If you need to reach something above you, use a strong step stool that has a grab bar. Keep electrical cords out of the way. Do not use floor polish or wax that makes floors slippery. If you must use wax, use non-skid floor wax. Do not have throw rugs and other things on the floor that can make you trip. What can I do with my stairs? Do not leave any items on the stairs. Make sure that there are handrails on both sides of the stairs and use them. Fix handrails that are broken or loose. Make sure that handrails are as long as the stairways. Check any carpeting to make sure that it is firmly attached  to the stairs. Fix any carpet that is loose or worn. Avoid having throw rugs at the top or bottom of the stairs. If you do have throw rugs, attach them to the floor with carpet tape. Make sure that you have a light switch at the top of the stairs and the bottom of the stairs. If you do not have them, ask someone to add them for you. What else can I do to help prevent falls? Wear shoes that: Do not have high heels. Have rubber bottoms. Are comfortable and fit you well. Are closed at the toe. Do not wear sandals. If you use a stepladder: Make sure that it is fully opened. Do not climb a closed stepladder. Make sure that both sides of the stepladder are locked into place. Ask  someone to hold it for you, if possible. Clearly mark and make sure that you can see: Any grab bars or handrails. First and last steps. Where the edge of each step is. Use tools that help you move around (mobility aids) if they are needed. These include: Canes. Walkers. Scooters. Crutches. Turn on the lights when you go into a dark area. Replace any light bulbs as soon as they burn out. Set up your furniture so you have a clear path. Avoid moving your furniture around. If any of your floors are uneven, fix them. If there are any pets around you, be aware of where they are. Review your medicines with your doctor. Some medicines can make you feel dizzy. This can increase your chance of falling. Ask your doctor what other things that you can do to help prevent falls. This information is not intended to replace advice given to you by your health care provider. Make sure you discuss any questions you have with your health care provider. Document Released: 03/06/2009 Document Revised: 10/16/2015 Document Reviewed: 06/14/2014 Elsevier Interactive Patient Education  2017 [employer].

## 2020-12-30 NOTE — Progress Notes (Signed)
Subjective:   Adrienne Anderson is a 80 y.o. female who presents for Medicare Annual (Subsequent) preventive examination.  Review of Systems     Cardiac Risk Factors include: advanced age (>45mn, >>16women);diabetes mellitus;dyslipidemia;hypertension     Objective:    Today's Vitals   12/30/20 1054  BP: 130/78  Pulse: 88  Resp: 16  Temp: 98.3 F (36.8 C)  TempSrc: Oral  SpO2: 98%  Weight: 182 lb 14.4 oz (83 kg)  Height: '5\' 9"'$  (1.753 m)   Body mass index is 27.01 kg/m.  Advanced Directives 12/30/2020 08/12/2020 07/29/2020 12/27/2019 12/22/2018 12/20/2017 11/09/2016  Does Patient Have a Medical Advance Directive? Yes Yes Yes Yes Yes Yes Yes  Type of AParamedicof APhillipsvilleLiving will HFairbanksLiving will HPoncaLiving will HCastle ShannonLiving will HChippewa LakeLiving will Living will;Healthcare Power of [place]landLiving will  Does patient want to make changes to medical advance directive? - No - Guardian declined No - Guardian declined - No - Patient declined - -  Copy of [place]anch[place]in Chart? No - copy requested No - copy requested No - copy requested No - copy requested No - copy requested No - copy requested -  Would patient like information on creating a medical advance directive? - - - - - - -    Current Medications (verified) Outpatient Encounter Medications as of 12/30/2020  Medication Sig   brimonidine (ALPHAGAN) 0.15 % ophthalmic solution Place 1 drop into both eyes 2 (two) times daily. x30 days   Empagliflozin-metFORMIN HCl ER (SYNJARDY XR) 12.09-998 MG TB24 Take 1 tablet by mouth daily.   glucose blood (ONE TOUCH ULTRA TEST) test strip Use as instructed   latanoprost (XALATAN) 0.005 % ophthalmic solution Apply to eye.   MULTIPLE VITAMIN PO Take by mouth.   rosuvastatin (CRESTOR) 40 MG tablet Take 1 tablet (40 mg total) by mouth daily.    telmisartan-hydrochlorothiazide (MICARDIS HCT) 80-12.5 MG tablet Take 1 tablet by mouth daily.   VITAMIN D PO Take 1,000 Units by mouth daily.   No facility-administered encounter medications on file as of 12/30/2020.    Allergies (verified) Clindamycin/lincomycin   History: Past Medical History:  Diagnosis Date   Cataract    Diabetes mellitus without complication ([place]    Glaucoma    Hyperlipidemia    Hypertension    Obesity    Wears dentures    full upper   Past Surgical History:  Procedure Laterality Date   BREAST BIOPSY Bilateral    rt/clip-02/27/03, left - all neg   BREAST LUMPECTOMY Bilateral 1982   neg   CATARACT EXTRACTION W/PHACO Right 07/29/2020   Procedure: CATARACT EXTRACTION PHACO AND INTRAOCULAR LENS PLACEMENT (IOC) RIGHT DIABETIC 4.08 00:33.1;  Surgeon: PBirder Robson MD;  [place] [facility]  Service: Ophthalmology;  Laterality: Right;  Diabetic - oral meds   CATARACT EXTRACTION W/PHACO Left 08/12/2020   Procedure: CATARACT EXTRACTION PHACO AND INTRAOCULAR LENS PLACEMENT (IOC) LEFT DIABETIC 5.32 00:32.2;  Surgeon: PBirder Robson MD;  [facility] [facility]  Service: Ophthalmology;  Laterality: Left;  Diabetic - oral meds   FINE NEEDLE ASPIRATION Left 05/20/2020   Dr. OHonor Junesthyroid -   VAGINAL HYSTERECTOMY  1986   Family History  Problem Relation Age of Onset   Depression Mother    Rheum arthritis Father    Hypertension Sister    Breast cancer Sister    Alcohol abuse Brother  Hypertension Sister    Heart disease Sister    Cancer Sister        lung   Hypertension Sister    Kidney disease Sister    COPD Sister    Heart failure Sister    Breast cancer Cousin        pat cousin   Hypertension Sister    Breast cancer Paternal Aunt    Social History   Socioeconomic History   Marital status: Widowed    Spouse name: Leane Para   Number of children: 2   Years of education: Not on file   Highest education level: Bachelor's  degree (e.g., BA, AB, BS)  Occupational History   Occupation: Retired  Tobacco Use   Smoking status: Never   Smokeless tobacco: Never   Tobacco comments:    smoking cessation materials not required  Vaping Use   Vaping Use: Never used  Substance and Sexual Activity   Alcohol use: No    Alcohol/week: 0.0 standard drinks   Drug use: No   Sexual activity: Not Currently    Partners: Male  Other Topics Concern   Not on file  Social History Narrative   Widow since 2020, two children in [place] Resource Strain: Low Risk    Difficulty of Paying Living Expenses: Not hard at all  Food Insecurity: No Food Insecurity   Worried About Charity fundraiser in the Last Year: Never true   Arboriculturist in the Last Year: Never true  Transportation Needs: No Transportation Needs   Lack of Transportation (Medical): No   Lack of Transportation (Non-Medical): No  Physical Activity: Inactive   Days of Exercise per Week: 0 days   Minutes of Exercise per Session: 0 min  Stress: No Stress Concern Present   Feeling of Stress : Not at all  Social Connections: Moderately Integrated   Frequency of Communication with Friends and Family: More than three times a week   Frequency of Social Gatherings with Friends and Family: More than three times a week   Attends Religious Services: More than 4 times per year   Active Member of [employer] or Organizations: Yes   Attends Archivist Meetings: More than 4 times per year   Marital Status: Widowed    Tobacco Counseling Counseling given: Not Answered Tobacco comments: smoking cessation materials not required   Clinical Intake:  Pre-visit preparation completed: Yes  Pain : No/denies pain     BMI - recorded: 27.01 Nutritional Status: BMI 25 -29 Overweight Nutritional Risks: None Diabetes: Yes CBG done?: No Did pt. bring in CBG monitor from home?: No  How often do you need to have someone  help you when you read instructions, pamphlets, or other written materials from your doctor or pharmacy?: 1 - Never  Nutrition Risk Assessment:  Has the patient had any N/V/D within the last 2 months?  No  Does the patient have any non-healing wounds?  No  Has the patient had any unintentional weight loss or weight gain?  No   Diabetes:  Is the patient diabetic?  Yes  If diabetic, was a CBG obtained today?  No  Did the patient bring in their glucometer from home?  No  How often do you monitor your CBG's? Occasionally per patient.   Financial Strains and Diabetes Management:  Are you having any financial strains with the device, your supplies or your medication? No .  Does  the patient want to be seen by Chronic Care Management for management of their diabetes?  No  Would the patient like to be referred to a Nutritionist or for Diabetic Management?  No   Diabetic Exams:  Diabetic Eye Exam: Completed 12/12/19 negative retinopathy. Overdue for diabetic eye exam. Pt has been advised about the importance in completing this exam. Pt has upcoming appt.   Diabetic Foot Exam: Completed 05/29/20.   Interpreter Needed?: No  Information entered by :: Clemetine Marker LPN   Activities of Daily Living In your present state of health, do you have any difficulty performing the following activities: 12/30/2020 12/05/2020  Hearing? N N  Vision? N N  Difficulty concentrating or making decisions? N N  Walking or climbing stairs? N N  Dressing or bathing? N N  Doing errands, shopping? N N  Preparing Food and eating ? N -  Using the Toilet? N -  In the past six months, have you accidently leaked urine? Y -  Comment wears pads occasionally for protection -  Do you have problems with loss of bowel control? N -  Managing your Medications? N -  Managing your Finances? N -  Housekeeping or managing your Housekeeping? N -  Some recent data might be hidden    Patient Care Team: Steele Sizer, MD as PCP  - General (Family Medicine) Birder Robson, MD as Consulting Physician (Ophthalmology)  Indicate any recent Medical Services you may have received from other than [facility] providers in the past year (date may be approximate).     Assessment:   This is a routine wellness examination for [place].  Hearing/Vision screen Hearing Screening - Comments:: Pt denies hearing difficulty Vision Screening - Comments:: Annual vision screenings done at [facility]  Dietary issues and exercise activities discussed: Current Exercise Habits: The patient does not participate in regular exercise at present, Exercise limited by: None identified   Goals Addressed   None    Depression Screen PHQ 2/9 Scores 12/30/2020 12/05/2020 08/27/2020 05/29/2020 03/21/2020 03/10/2020 12/27/2019  PHQ - 2 Score 0 0 0 0 0 0 0  PHQ- 9 Score - - - - - - -    Fall Risk Fall Risk  12/30/2020 12/05/2020 08/27/2020 05/29/2020 04/07/2020  Falls in the past year? 0 0 0 0 0  Comment - - - - -  Number falls in past yr: 0 0 0 0 0  Injury with Fall? 0 0 0 0 0  Comment - - - - -  Risk for fall due to : No Fall Risks - - - -  Risk for fall due to: Comment - - - - -  Follow up Falls prevention discussed - - Falls evaluation completed -    FALL RISK PREVENTION PERTAINING TO THE HOME:  Any stairs in or around the home? Yes  If so, are there any without handrails? No  Home free of loose throw rugs in walkways, pet beds, electrical cords, etc? Yes  Adequate lighting in your home to reduce risk of falls? Yes   ASSISTIVE DEVICES UTILIZED TO PREVENT FALLS:  Life alert? No  Use of a cane, walker or w/c? No  Grab bars in the bathroom? Yes  Shower chair or bench in shower? Yes  Elevated toilet seat or a handicapped toilet? Yes   TIMED UP AND GO:  Was the test performed? Yes .  Length of time to ambulate 10 feet: 5 sec.   Gait steady and fast without use of  assistive device  Cognitive Function: Normal cognitive status assessed by  direct observation by this Nurse Health Advisor. No abnormalities found.       6CIT Screen 12/27/2019 12/22/2018 12/20/2017  What Year? 0 points 0 points 0 points  What month? 0 points 0 points 0 points  What time? 0 points 0 points 0 points  Count back from 20 0 points 0 points 0 points  Months in reverse 0 points 0 points 0 points  Repeat phrase 0 points 0 points 2 points  Total Score 0 0 2    Immunizations Immunization History  Administered Date(s) Administered   Fluad Quad(high Dose 65+) 01/24/2019   Influenza Split 03/25/2014   Influenza, High Dose Seasonal PF 03/10/2016, 03/28/2017, 02/02/2018, 03/07/2020   Influenza, Seasonal, Injecte, Preservative Fre 05/03/2012   Influenza,inj,Quad PF,6+ Mos 03/06/2015   Influenza-Unspecified 05/03/2012   Moderna Sars-Covid-2 Vaccination 06/29/2019, 07/27/2019, 03/19/2020, 09/10/2020   Pneumococcal Conjugate-13 10/30/2014   Pneumococcal Polysaccharide-23 05/03/2012   Tdap 08/03/2012, 03/05/2020   Zoster Recombinat (Shingrix) 02/02/2018, 04/04/2018   Zoster, Live 11/06/2012    TDAP status: Up to date  Flu Vaccine status: Up to date  Pneumococcal vaccine status: Up to date  Covid-19 vaccine status: Completed vaccines  Qualifies for Shingles Vaccine? Yes   Zostavax completed Yes   Shingrix Completed?: Yes  Screening Tests Health Maintenance  Topic Date Due   OPHTHALMOLOGY EXAM  12/11/2020   INFLUENZA VACCINE  12/22/2020   Hepatitis C Screening  03/10/2021 (Originally 05/04/1959)   COVID-19 Vaccine (5 - Booster for Moderna series) 01/10/2021   FOOT EXAM  05/29/2021   HEMOGLOBIN A1C  06/07/2021   MAMMOGRAM  11/27/2021   TETANUS/TDAP  03/05/2030   DEXA SCAN  Completed   PNA vac Low Risk Adult  Completed   Zoster Vaccines- Shingrix  Completed   HPV VACCINES  Aged Out    Health Maintenance  Health Maintenance Due  Topic Date Due   OPHTHALMOLOGY EXAM  12/11/2020   INFLUENZA VACCINE  12/22/2020    Colorectal cancer  screening: No longer required.   Mammogram status: Completed 11/27/20. Repeat every year  Bone Density status: Completed 02/13/18. Results reflect: Bone density results: NORMAL. Repeat every 2 years. Pt declined repeat screening at this time.  Lung Cancer Screening: (Low Dose CT Chest recommended if Age 31-80 years, 30 pack-year currently smoking OR have quit w/in 15years.) does not qualify.  Additional Screening:  Hepatitis C Screening: does qualify; postponed  Vision Screening: Recommended annual ophthalmology exams for early detection of glaucoma and other disorders of the eye. Is the patient up to date with their annual eye exam?  Yes  Who is the provider or what is the name of the office in which the patient attends annual eye exams? [facility].   Dental Screening: Recommended annual dental exams for proper oral hygiene  Community Resource Referral / Chronic Care Management: CRR required this visit?  No   CCM required this visit?  No      Plan:     I have personally reviewed and noted the following in the patient's chart:   Medical and social history Use of alcohol, tobacco or illicit drugs  Current medications and supplements including opioid prescriptions.  Functional ability and status Nutritional status Physical activity Advanced directives List of other physicians Hospitalizations, surgeries, and ER visits in previous 12 months Vitals Screenings to include cognitive, depression, and falls Referrals and appointments  In addition, I have reviewed and discussed with patient certain preventive protocols,  quality metrics, and best practice recommendations. A written personalized care plan for preventive services as well as general preventive health recommendations were provided to patient.     Clemetine Marker, LPN   [id number]   Nurse Notes: none

## 2021-01-05 DIAGNOSIS — Z7722 Contact with and (suspected) exposure to environmental tobacco smoke (acute) (chronic): Secondary | ICD-10-CM | POA: Diagnosis not present

## 2021-01-05 DIAGNOSIS — E119 Type 2 diabetes mellitus without complications: Secondary | ICD-10-CM | POA: Diagnosis not present

## 2021-01-05 DIAGNOSIS — E663 Overweight: Secondary | ICD-10-CM | POA: Diagnosis not present

## 2021-01-05 DIAGNOSIS — I10 Essential (primary) hypertension: Secondary | ICD-10-CM | POA: Diagnosis not present

## 2021-01-05 DIAGNOSIS — E785 Hyperlipidemia, unspecified: Secondary | ICD-10-CM | POA: Diagnosis not present

## 2021-01-05 DIAGNOSIS — R32 Unspecified urinary incontinence: Secondary | ICD-10-CM | POA: Diagnosis not present

## 2021-01-05 DIAGNOSIS — Z6827 Body mass index (BMI) 27.0-27.9, adult: Secondary | ICD-10-CM | POA: Diagnosis not present

## 2021-01-05 DIAGNOSIS — Z7984 Long term (current) use of oral hypoglycemic drugs: Secondary | ICD-10-CM | POA: Diagnosis not present

## 2021-01-05 DIAGNOSIS — H409 Unspecified glaucoma: Secondary | ICD-10-CM | POA: Diagnosis not present

## 2021-02-24 ENCOUNTER — Encounter: Payer: Self-pay | Admitting: Family Medicine

## 2021-02-24 ENCOUNTER — Other Ambulatory Visit: Payer: Self-pay

## 2021-02-24 ENCOUNTER — Ambulatory Visit: Payer: Medicare PPO | Admitting: Family Medicine

## 2021-02-24 VITALS — BP 116/74 | HR 84 | Temp 98.0°F | Resp 16 | Ht 68.0 in | Wt 184.0 lb

## 2021-02-24 DIAGNOSIS — M79605 Pain in left leg: Secondary | ICD-10-CM | POA: Diagnosis not present

## 2021-02-24 DIAGNOSIS — M5410 Radiculopathy, site unspecified: Secondary | ICD-10-CM

## 2021-02-24 DIAGNOSIS — Z23 Encounter for immunization: Secondary | ICD-10-CM | POA: Diagnosis not present

## 2021-02-24 MED ORDER — MELOXICAM 15 MG PO TABS: 15.0000 mg | ORAL_TABLET | Freq: Every day | ORAL | 0 refills | Status: DC

## 2021-02-24 MED ORDER — PREGABALIN 50 MG PO CAPS
50.0000 mg | ORAL_CAPSULE | Freq: Three times a day (TID) | ORAL | 0 refills | Status: DC
Start: 2021-02-24 — End: 2021-06-09

## 2021-02-24 NOTE — Progress Notes (Signed)
Name: Adrienne Anderson   MRN: [id number]    DOB: [birth year]   Date:02/24/2021       Progress Note  Subjective  Chief Complaint  Thigh Pain  HPI  Left posterior thigh pain: symptoms started over the past 5-6 weeks, no injuries, just noticed some pain on left mid posterior thigh when walking, specially after she wakes up and after sitting for a while. No rashes, no problems on groin, denies bowel or bladder incontinence. She states she has been helping her sister get settle at her new place. She has a long history of low back pain but the thigh pain is new. No fever or chills.    Patient Active Problem List   Diagnosis Date Noted   Sensorineural hearing loss of combined sites, bilateral 11/19/2015   Glaucoma 08/24/2014   Cataract 08/24/2014   Dyslipidemia associated with type 2 diabetes mellitus ([place]) 08/24/2014   Hypertension associated with diabetes ([place]) 08/24/2014   Dyslipidemia 08/24/2014    Past Surgical History:  Procedure Laterality Date   BREAST BIOPSY Bilateral    rt/clip-02/27/03, left - all neg   BREAST LUMPECTOMY Bilateral 1982   neg   CATARACT EXTRACTION W/PHACO Right 07/29/2020   Procedure: CATARACT EXTRACTION PHACO AND INTRAOCULAR LENS PLACEMENT (IOC) RIGHT DIABETIC 4.08 00:33.1;  Surgeon: Birder Robson, MD;  Location: [place];  Service: Ophthalmology;  Laterality: Right;  Diabetic - oral meds   CATARACT EXTRACTION W/PHACO Left 08/12/2020   Procedure: CATARACT EXTRACTION PHACO AND INTRAOCULAR LENS PLACEMENT (IOC) LEFT DIABETIC 5.32 00:32.2;  Surgeon: Birder Robson, MD;  Location: [place];  Service: Ophthalmology;  Laterality: Left;  Diabetic - oral meds   FINE NEEDLE ASPIRATION Left 05/20/2020   Dr. Honor Junes thyroid -   VAGINAL HYSTERECTOMY  1986    Family History  Problem Relation Age of Onset   Depression Mother    Rheum arthritis Father    Hypertension Sister    Breast cancer Sister    Alcohol abuse Brother    Hypertension  Sister    Heart disease Sister    Cancer Sister        lung   Hypertension Sister    Kidney disease Sister    COPD Sister    Heart failure Sister    Breast cancer Cousin        pat cousin   Hypertension Sister    Breast cancer Paternal Aunt     Social History   Tobacco Use   Smoking status: Never   Smokeless tobacco: Never   Tobacco comments:    smoking cessation materials not required  Substance Use Topics   Alcohol use: No    Alcohol/week: 0.0 standard drinks     Current Outpatient Medications:    brimonidine (ALPHAGAN) 0.15 % ophthalmic solution, Place 1 drop into both eyes 2 (two) times daily. x30 days, Disp: , Rfl: 5   Empagliflozin-metFORMIN HCl ER (SYNJARDY XR) 12.09-998 MG TB24, Take 1 tablet by mouth daily., Disp: 90 tablet, Rfl: 1   glucose blood (ONE TOUCH ULTRA TEST) test strip, Use as instructed, Disp: 100 each, Rfl: 12   latanoprost (XALATAN) 0.005 % ophthalmic solution, Apply to eye., Disp: , Rfl:    MULTIPLE VITAMIN PO, Take by mouth., Disp: , Rfl:    rosuvastatin (CRESTOR) 40 MG tablet, Take 1 tablet (40 mg total) by mouth daily., Disp: 90 tablet, Rfl: 1   telmisartan-hydrochlorothiazide (MICARDIS HCT) 80-12.5 MG tablet, Take 1 tablet by mouth daily., Disp: 90 tablet, Rfl: 1  VITAMIN D PO, Take 1,000 Units by mouth daily., Disp: , Rfl:   Allergies  Allergen Reactions   Clindamycin/Lincomycin Nausea Only    I personally reviewed active problem list, medication list, allergies, family history, social history, health maintenance with the patient/caregiver today.   ROS  Ten systems reviewed and is negative except as mentioned in HPI   Objective  Vitals:   02/24/21 1401  BP: 116/74  Pulse: 84  Resp: 16  Temp: 98 F (36.7 C)  SpO2: 99%  Weight: 184 lb (83.5 kg)  Height: 5\' 8"  (1.727 m)    Body mass index is 27.98 kg/m.  Physical Exam  Constitutional: Patient appears well-developed and well-nourished.  No distress.  HEENT: head  atraumatic, normocephalic, pupils equal and reactive to light, neck supple Cardiovascular: Normal rate, regular rhythm and normal heart sounds.  No murmur heard. No BLE edema. Pulmonary/Chest: Effort normal and breath sounds normal. No respiratory distress. Abdominal: Soft.  There is no tenderness. Muscular skeletal: pain with extension of back and left lateral bending. Negative straight leg raise, no pain on hamstring with flexion of spine. No redness, no pain during palpation of trochanteric bursa , no rashes  Psychiatric: Patient has a normal mood and affect. behavior is normal. Judgment and thought content normal.   Recent Results (from the past 2160 hour(s))  POCT HgB A1C     Status: Abnormal   Collection Time: 12/05/20  9:46 AM  Result Value Ref Range   Hemoglobin A1C 6.7 (A) 4.0 - 5.6 %   HbA1c POC (<> result, manual entry)     HbA1c, POC (prediabetic range)     HbA1c, POC (controlled diabetic range)        PHQ2/9: Depression screen [facility] 2/9 02/24/2021 12/30/2020 12/05/2020 08/27/2020 05/29/2020  Decreased Interest 0 0 0 0 0  Down, Depressed, Hopeless 0 0 0 0 0  PHQ - 2 Score 0 0 0 0 0  Altered sleeping - - - - -  Tired, decreased energy - - - - -  Change in appetite - - - - -  Feeling bad or failure about yourself  - - - - -  Trouble concentrating - - - - -  Moving slowly or fidgety/restless - - - - -  Suicidal thoughts - - - - -  PHQ-9 Score - - - - -  Difficult doing work/chores - - - - -  Some recent data might be hidden    phq 9 is negative   Fall Risk: Fall Risk  02/24/2021 12/30/2020 12/05/2020 08/27/2020 05/29/2020  Falls in the past year? 0 0 0 0 0  Comment - - - - -  Number falls in past yr: 0 0 0 0 0  Injury with Fall? 0 0 0 0 0  Comment - - - - -  Risk for fall due to : No Fall Risks No Fall Risks - - -  Risk for fall due to: Comment - - - - -  Follow up Falls prevention discussed Falls prevention discussed - - Falls evaluation completed      Functional Status  Survey: Is the patient deaf or have difficulty hearing?: No Does the patient have difficulty seeing, even when wearing glasses/contacts?: No Does the patient have difficulty concentrating, remembering, or making decisions?: No Does the patient have difficulty walking or climbing stairs?: No Does the patient have difficulty dressing or bathing?: No Does the patient have difficulty doing errands alone such as visiting a doctor's office or  shopping?: No    Assessment & Plan   1. Need for influenza vaccination  - Flu Vaccine QUAD High Dose(Fluad)  2. Leg pain, posterior, left  - pregabalin (LYRICA) 50 MG capsule; Take 1-2 capsules (50-100 mg total) by mouth 3 (three) times daily.  Dispense: 90 capsule; Refill: 0 - meloxicam (MOBIC) 15 MG tablet; Take 1 tablet (15 mg total) by mouth daily. For one week and after that as needed  Dispense: 30 tablet; Refill: 0  3. Back pain with radiculopathy  - pregabalin (LYRICA) 50 MG capsule; Take 1-2 capsules (50-100 mg total) by mouth 3 (three) times daily.  Dispense: 90 capsule; Refill: 0 - meloxicam (MOBIC) 15 MG tablet; Take 1 tablet (15 mg total) by mouth daily. For one week and after that as needed  Dispense: 30 tablet; Refill: 0

## 2021-03-25 ENCOUNTER — Other Ambulatory Visit: Payer: Self-pay | Admitting: Family Medicine

## 2021-03-25 ENCOUNTER — Encounter: Payer: Self-pay | Admitting: Family Medicine

## 2021-03-25 DIAGNOSIS — E119 Type 2 diabetes mellitus without complications: Secondary | ICD-10-CM | POA: Diagnosis not present

## 2021-03-25 DIAGNOSIS — M5410 Radiculopathy, site unspecified: Secondary | ICD-10-CM

## 2021-03-25 DIAGNOSIS — M79605 Pain in left leg: Secondary | ICD-10-CM

## 2021-03-25 LAB — HM DIABETES EYE EXAM
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
## 2021-05-20 DIAGNOSIS — E041 Nontoxic single thyroid nodule: Secondary | ICD-10-CM | POA: Diagnosis not present

## 2021-05-28 ENCOUNTER — Ambulatory Visit
Admission: EM | Admit: 2021-05-28 | Discharge: 2021-05-28 | Disposition: A | Discharge status: Home / Self Care | Payer: Medicare PPO | Attending: Internal Medicine | Admitting: Internal Medicine

## 2021-05-28 ENCOUNTER — Ambulatory Visit: Payer: Self-pay | Admitting: *Deleted

## 2021-05-28 ENCOUNTER — Other Ambulatory Visit: Payer: Self-pay

## 2021-05-28 DIAGNOSIS — S76312A Strain of muscle, fascia and tendon of the posterior muscle group at thigh level, left thigh, initial encounter: Secondary | ICD-10-CM | POA: Diagnosis not present

## 2021-05-28 MED ORDER — MELOXICAM 7.5 MG PO TABS: 7.5000 mg | ORAL_TABLET | Freq: Two times a day (BID) | ORAL | 0 refills | Status: DC

## 2021-05-28 MED ORDER — BACLOFEN 10 MG PO TABS: 5.0000 mg | ORAL_TABLET | Freq: Three times a day (TID) | ORAL | 0 refills | Status: DC

## 2021-05-28 NOTE — Telephone Encounter (Signed)
Summary: back/sciatica pain   Pt states she is having severe sciatica pain.  She was moving something the other day and felt it in her back.  She said she must get something for the pain today. No appts and Dr Ancil Boozer is out this week       Chief Complaint: requesting pain medication due to left hip , back pain after lifting christmas item and caused sciatic pain  Symptoms: back pain, left hip, leg pain  Frequency: since Tuesday 05/26/21 Pertinent Negatives: Patient denies N/T, inability to walk  Disposition: [] ED /[x] Urgent Care (no appt availability in office) / [] Appointment(In office/virtual)/ [x]  [facility]/ [] Home Care/ [x] Refused Recommended Disposition /[] [place] [facility]/ []  Follow-up with PCP Additional Notes:  Requesting a call back today would like to see a provider in clinic due to "knowing her records"  . Recommended UC , E visit. Please advise         Reason for Disposition  [1] Pain radiates into the thigh or further down the leg AND [2] one leg  Answer Assessment - Initial Assessment Questions 1. ONSET: "When did the pain begin?"      Tuesday  2. LOCATION: "Where does it hurt?" (upper, mid or lower back)     Left hip pain and down leg  3. SEVERITY: "How bad is the pain?"  (e.g., Scale 1-10; mild, moderate, or severe)   - MILD (1-3): doesn't interfere with normal activities    - MODERATE (4-7): interferes with normal activities or awakens from sleep    - SEVERE (8-10): excruciating pain, unable to do any normal activities      Moderate difficulty sleeping unable to lay down  4. PATTERN: "Is the pain constant?" (e.g., yes, no; constant, intermittent)      Constant now  5. RADIATION: "Does the pain shoot into your legs or elsewhere?"     Left leg  6. CAUSE:  "What do you think is causing the back pain?"      Picked up christmas item and felt pain  7. BACK OVERUSE:  "Any recent lifting of heavy objects, strenuous work or exercise?"     Yes 8.  MEDICATIONS: "What have you taken so far for the pain?" (e.g., nothing, acetaminophen, NSAIDS)     Yes OTC 9. NEUROLOGIC SYMPTOMS: "Do you have any weakness, numbness, or problems with bowel/bladder control?"     No  10. OTHER SYMPTOMS: "Do you have any other symptoms?" (e.g., fever, abdominal pain, burning with urination, blood in urine)       Difficulty laying flat , limping at times  11. PREGNANCY: "Is there any chance you are pregnant?" (e.g., yes, no; LMP)       na  Protocols used: Back Pain-A-AH

## 2021-05-28 NOTE — ED Provider Notes (Signed)
[facility]    CSN: [id number] Arrival date & time: 05/28/21  1416      History   Chief Complaint Chief Complaint  Patient presents with   Leg Pain    left    HPI Adrienne Anderson is a 81 y.o. female who presents with R posterior thigh pain since yesterday. She hurt her back lifting a TV 2 days ago and did not have pain then. Has had sciatica in the past but has been several years. She never had spine MRI. Pain on her posterior thigh is provoked with sitting on it and cant find a comfortable position when she sits for too long.      Past Medical History:  Diagnosis Date   Cataract    Diabetes mellitus without complication ([place])    Glaucoma    Hyperlipidemia    Hypertension    Obesity    Wears dentures    full upper    Patient Active Problem List   Diagnosis Date Noted   Sensorineural hearing loss of combined sites, bilateral 11/19/2015   Glaucoma 08/24/2014   Cataract 08/24/2014   Dyslipidemia associated with type 2 diabetes mellitus ([place]) 08/24/2014   Hypertension associated with diabetes ([place]) 08/24/2014   Dyslipidemia 08/24/2014    Past Surgical History:  Procedure Laterality Date   BREAST BIOPSY Bilateral    rt/clip-02/27/03, left - all neg   BREAST LUMPECTOMY Bilateral 1982   neg   CATARACT EXTRACTION W/PHACO Right 07/29/2020   Procedure: CATARACT EXTRACTION PHACO AND INTRAOCULAR LENS PLACEMENT (IOC) RIGHT DIABETIC 4.08 00:33.1;  Surgeon: Birder Robson, MD;  Location: [place];  Service: Ophthalmology;  Laterality: Right;  Diabetic - oral meds   CATARACT EXTRACTION W/PHACO Left 08/12/2020   Procedure: CATARACT EXTRACTION PHACO AND INTRAOCULAR LENS PLACEMENT (IOC) LEFT DIABETIC 5.32 00:32.2;  Surgeon: Birder Robson, MD;  Location: [place];  Service: Ophthalmology;  Laterality: Left;  Diabetic - oral meds   FINE NEEDLE ASPIRATION Left 05/20/2020   Dr. Honor Junes thyroid -   [place]    OB History   No  obstetric history on file.      Home Medications    Prior to Admission medications   Medication Sig Start Date End Date Taking? Authorizing Provider  brimonidine (ALPHAGAN) 0.15 % ophthalmic solution Place 1 drop into both eyes 2 (two) times daily. x30 days 11/21/17  Yes [provider]  Empagliflozin-metFORMIN HCl ER (SYNJARDY XR) 12.09-998 MG TB24 Take 1 tablet by mouth daily. 12/05/20  Yes Sowles, Drue Stager, MD  glucose blood (ONE TOUCH ULTRA TEST) test strip Use as instructed 11/02/16  Yes Hubbard Hartshorn, FNP  latanoprost (XALATAN) 0.005 % ophthalmic solution Apply to eye.   Yes [provider]  meloxicam (MOBIC) 15 MG tablet TAKE 1 TABLET (15 MG TOTAL) BY MOUTH DAILY. FOR ONE WEEK AND AFTER THAT AS NEEDED 03/25/21  Yes Steele Sizer, MD  MULTIPLE VITAMIN PO Take by mouth.   Yes [provider]  pregabalin (LYRICA) 50 MG capsule Take 1-2 capsules (50-100 mg total) by mouth 3 (three) times daily. 02/24/21  Yes Sowles, Drue Stager, MD  rosuvastatin (CRESTOR) 40 MG tablet Take 1 tablet (40 mg total) by mouth daily. 12/05/20  Yes Sowles, Drue Stager, MD  telmisartan-hydrochlorothiazide (MICARDIS HCT) 80-12.5 MG tablet Take 1 tablet by mouth daily. 12/05/20  Yes Sowles, Drue Stager, MD  VITAMIN D PO Take 1,000 Units by mouth daily.   Yes [provider]    Family History Family  History  Problem Relation Age of Onset   Depression Mother    Rheum arthritis Father    Hypertension Sister    Breast cancer Sister    Alcohol abuse Brother    Hypertension Sister    Heart disease Sister    Cancer Sister        lung   Hypertension Sister    Kidney disease Sister    COPD Sister    Heart failure Sister    Breast cancer Cousin        pat cousin   Hypertension Sister    Breast cancer Paternal Aunt     Social History Social History   Tobacco Use   Smoking status: Never   Smokeless tobacco: Never   Tobacco comments:    smoking cessation materials not required  Vaping  Use   Vaping Use: Never used  Substance Use Topics   Alcohol use: No    Alcohol/week: 0.0 standard drinks   Drug use: No     Allergies   Clindamycin/lincomycin   Review of Systems Review of Systems  Musculoskeletal:  Positive for back pain.       L posterior thigh  Skin:  Negative for rash.  Neurological:  Positive for numbness. Negative for weakness.       On posterior thigh    Physical Exam Triage Vital Signs ED Triage Vitals  Enc Vitals Group     BP 05/28/21 1523 (!) 159/89     Pulse Rate 05/28/21 1523 85     Resp 05/28/21 1523 18     Temp 05/28/21 1523 99.1 F (37.3 C)     Temp Source 05/28/21 1523 Oral     SpO2 05/28/21 1523 96 %     Weight 05/28/21 1522 183 lb (83 kg)     Height 05/28/21 1522 5\' 8"  (1.727 m)     Head Circumference --      Peak Flow --      Pain Score 05/28/21 1521 9     Pain Loc --      Pain Edu? --      Excl. in [place]? --    No data found.  Updated Vital Signs BP (!) 159/89 (BP Location: Right Arm)    Pulse 85    Temp 99.1 F (37.3 C) (Oral)    Resp 18    Ht 5\' 8"  (1.727 m)    Wt 183 lb (83 kg)    SpO2 96%    BMI 27.83 kg/m   Visual Acuity Right Eye Distance:   Left Eye Distance:   Bilateral Distance:    Right Eye Near:   Left Eye Near:    Bilateral Near:     Physical Exam Vitals and nursing note reviewed.  Constitutional:      General: She is not in acute distress.    Appearance: Normal appearance. She is not toxic-appearing.  HENT:     Right Ear: External ear normal.     Left Ear: External ear normal.  Eyes:     General: No scleral icterus.    Conjunctiva/sclera: Conjunctivae normal.  Musculoskeletal:     Cervical back: Neck supple.     Comments: BACK- spine ROM provoked her back pain, but has negative SLR. Does not have local tenderness with palpation R LE- with pain on her hamstring with palpation and testing of strength which provoked her pain.   Skin:    General: Skin is warm and dry.     Findings:  No rash.   Neurological:     Mental Status: She is alert and oriented to person, place, and time.     Gait: Gait normal.     Deep Tendon Reflexes: Reflexes normal.  Psychiatric:        Mood and Affect: Mood normal.        Behavior: Behavior normal.        Thought Content: Thought content normal.        Judgment: Judgment normal.     UC Treatments / Results  Labs (all labs ordered are listed, but only abnormal results are displayed) Labs Reviewed - No data to display  EKG   Radiology No results found.  Procedures Procedures (including critical care time)  Medications Ordered in UC Medications - No data to display  Initial Impression / Assessment and Plan / UC Course  I have reviewed the triage vital signs and the nursing notes. Back strain R hamstring strain. Has neg SLR, so I have low suspicion of sciatica.  I placed her on Mobic and Baclofen as noted. Needs to Fu with PCP next week.  Final Clinical Impressions(s) / UC Diagnoses   Final diagnoses:  None   Discharge Instructions   None    ED Prescriptions   None    PDMP not reviewed this encounter.   Shelby Mattocks, [place] 05/28/21 1947

## 2021-05-28 NOTE — Discharge Instructions (Addendum)
Apply ice on area of pain for 15 minutes and alternate with heat up to 3 times a day for 7 days. So massaging or strained muscle and stretches. Follow up with your primary care doctor  next week.

## 2021-05-28 NOTE — ED Triage Notes (Signed)
Pt here with C?O sciatica pain on left side for 2 days, went to pick tv up and has been having pain since.

## 2021-05-31 ENCOUNTER — Other Ambulatory Visit: Payer: Self-pay | Admitting: Family Medicine

## 2021-05-31 DIAGNOSIS — E1169 Type 2 diabetes mellitus with other specified complication: Secondary | ICD-10-CM

## 2021-05-31 DIAGNOSIS — E785 Hyperlipidemia, unspecified: Secondary | ICD-10-CM
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
## 2021-06-03 ENCOUNTER — Other Ambulatory Visit: Payer: Self-pay | Admitting: Family Medicine

## 2021-06-03 DIAGNOSIS — E785 Hyperlipidemia, unspecified: Secondary | ICD-10-CM

## 2021-06-03 DIAGNOSIS — E1169 Type 2 diabetes mellitus with other specified complication: Secondary | ICD-10-CM
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
## 2021-06-09 ENCOUNTER — Encounter: Payer: Self-pay | Admitting: Family Medicine

## 2021-06-09 ENCOUNTER — Ambulatory Visit: Payer: Medicare PPO | Admitting: Family Medicine

## 2021-06-09 VITALS — BP 128/76 | HR 81 | Temp 98.3°F | Resp 16 | Ht 68.0 in | Wt 183.0 lb

## 2021-06-09 DIAGNOSIS — Z1159 Encounter for screening for other viral diseases: Secondary | ICD-10-CM

## 2021-06-09 DIAGNOSIS — E042 Nontoxic multinodular goiter: Secondary | ICD-10-CM | POA: Diagnosis not present

## 2021-06-09 DIAGNOSIS — E1159 Type 2 diabetes mellitus with other circulatory complications: Secondary | ICD-10-CM | POA: Diagnosis not present

## 2021-06-09 DIAGNOSIS — I10 Essential (primary) hypertension: Secondary | ICD-10-CM

## 2021-06-09 DIAGNOSIS — E785 Hyperlipidemia, unspecified: Secondary | ICD-10-CM

## 2021-06-09 DIAGNOSIS — I152 Hypertension secondary to endocrine disorders: Secondary | ICD-10-CM | POA: Diagnosis not present

## 2021-06-09 DIAGNOSIS — M8588 Other specified disorders of bone density and structure, other site: Secondary | ICD-10-CM

## 2021-06-09 DIAGNOSIS — E1169 Type 2 diabetes mellitus with other specified complication: Secondary | ICD-10-CM

## 2021-06-09 DIAGNOSIS — M5410 Radiculopathy, site unspecified: Secondary | ICD-10-CM

## 2021-06-09 LAB — POCT GLYCOSYLATED HEMOGLOBIN (HGB A1C): Hemoglobin A1C: 6.7 % — AB (ref 4.0–5.6)

## 2021-06-09 MED ORDER — SYNJARDY XR 12.5-1000 MG PO TB24: 1.0000 | ORAL_TABLET | Freq: Every day | ORAL | 1 refills | Status: DC

## 2021-06-09 MED ORDER — TELMISARTAN-HCTZ 80-12.5 MG PO TABS: 1.0000 | ORAL_TABLET | Freq: Every day | ORAL | 1 refills | Status: DC

## 2021-06-09 NOTE — Addendum Note (Signed)
Addended by: Steele Sizer F on: 06/09/2021 09:14 AM   Modules accepted: Orders

## 2021-06-10 LAB — CBC WITH DIFFERENTIAL/PLATELET
Absolute Monocytes: 400 cells/uL (ref 200–950)
Basophils Absolute: 19 cells/uL (ref 0–200)
Basophils Relative: 0.6 %
Eosinophils Absolute: 59 cells/uL (ref 15–500)
Eosinophils Relative: 1.9 %
HCT: 38.8 % (ref 35.0–45.0)
Hemoglobin: 12.8 g/dL (ref 11.7–15.5)
Lymphs Abs: 1225 cells/uL (ref 850–3900)
MCH: 31.3 pg (ref 27.0–33.0)
MCHC: 33 g/dL (ref 32.0–36.0)
MCV: 94.9 fL (ref 80.0–100.0)
MPV: 11.2 fL (ref 7.5–12.5)
Monocytes Relative: 12.9 %
Neutro Abs: 1398 cells/uL — ABNORMAL LOW (ref 1500–7800)
Neutrophils Relative %: 45.1 %
Platelets: 198 10*3/uL (ref 140–400)
RBC: 4.09 10*6/uL (ref 3.80–5.10)
RDW: 13 % (ref 11.0–15.0)
Total Lymphocyte: 39.5 %
WBC: 3.1 10*3/uL — ABNORMAL LOW (ref 3.8–10.8)

## 2021-06-10 LAB — LIPID PANEL
Cholesterol: 133 mg/dL (ref <200)
HDL: 62 mg/dL (ref >=50)
LDL Cholesterol (Calc): 53 mg/dL (calc)
Non-HDL Cholesterol (Calc): 71 mg/dL (calc) (ref <130)
Total CHOL/HDL Ratio: 2.1 (calc) (ref <5.0)
Triglycerides: 99 mg/dL (ref <150)

## 2021-06-10 LAB — COMPLETE METABOLIC PANEL WITH GFR
AG Ratio: 1.6 (calc) (ref 1.0–2.5)
ALT: 23 U/L (ref 6–29)
AST: 20 U/L (ref 10–35)
Albumin: 4.3 g/dL (ref 3.6–5.1)
Alkaline phosphatase (APISO): 69 U/L (ref 37–153)
BUN: 15 mg/dL (ref 7–25)
CO2: 32 mmol/L (ref 20–32)
Calcium: 9.2 mg/dL (ref 8.6–10.4)
Chloride: 107 mmol/L (ref 98–110)
Creat: 0.74 mg/dL (ref 0.60–1.00)
Globulin: 2.7 g/dL (calc) (ref 1.9–3.7)
Glucose, Bld: 121 mg/dL — ABNORMAL HIGH (ref 65–99)
Potassium: 4 mmol/L (ref 3.5–5.3)
Sodium: 143 mmol/L (ref 135–146)
Total Bilirubin: 0.6 mg/dL (ref 0.2–1.2)
Total Protein: 7 g/dL (ref 6.1–8.1)
eGFR: 83 mL/min/{1.73_m2} (ref >=60)

## 2021-06-10 LAB — HEPATITIS C ANTIBODY
Hepatitis C Ab: NONREACTIVE
SIGNAL TO CUT-OFF: 0.08 (ref <1.00)

## 2021-06-10 LAB — MICROALBUMIN / CREATININE URINE RATIO
Creatinine, Urine: 97 mg/dL (ref 20–275)
Microalb Creat Ratio: 95 mcg/mg creat — ABNORMAL HIGH (ref <30)
Microalb, Ur: 9.2 mg/dL

## 2021-06-10 LAB — VITAMIN D 25 HYDROXY (VIT D DEFICIENCY, FRACTURES): Vit D, 25-Hydroxy: 42 ng/mL (ref 30–100)

## 2021-06-15 ENCOUNTER — Ambulatory Visit: Payer: Medicare PPO | Attending: Family Medicine | Admitting: Physical Therapy

## 2021-06-15 ENCOUNTER — Other Ambulatory Visit: Payer: Self-pay

## 2021-06-15 ENCOUNTER — Encounter: Payer: Self-pay | Admitting: Physical Therapy

## 2021-06-15 DIAGNOSIS — M25662 Stiffness of left knee, not elsewhere classified: Secondary | ICD-10-CM | POA: Diagnosis not present

## 2021-06-15 DIAGNOSIS — M79605 Pain in left leg: Secondary | ICD-10-CM | POA: Insufficient documentation

## 2021-06-15 DIAGNOSIS — M5410 Radiculopathy, site unspecified: Secondary | ICD-10-CM | POA: Diagnosis not present

## 2021-06-15 DIAGNOSIS — M6281 Muscle weakness (generalized): Secondary | ICD-10-CM | POA: Diagnosis not present

## 2021-06-15 DIAGNOSIS — S76312A Strain of muscle, fascia and tendon of the posterior muscle group at thigh level, left thigh, initial encounter: Secondary | ICD-10-CM | POA: Diagnosis not present

## 2021-06-15 DIAGNOSIS — M5432 Sciatica, left side: Secondary | ICD-10-CM | POA: Diagnosis not present

## 2021-06-16 NOTE — Therapy (Signed)
[place] [facility] [address] [place], [place], [address] Phone: [phone]   Fax:  [phone]  Physical Therapy Evaluation  Patient Details  Name: Adrienne Anderson MRN: [id number] Date of Birth: 1941/03/30 Referring Provider (PT): Dr. Steele Sizer   Encounter Date: 06/15/2021   PT End of Session - 06/15/21 1247     Visit Number 1    Number of Visits 16    Date for PT Re-Evaluation 08/10/21    Authorization - Visit Number 1    Authorization - Number of Visits 10    PT Start Time 0848    PT Stop Time 0946    PT Time Calculation (min) 58 min    Activity Tolerance Patient tolerated treatment well    Behavior During Therapy [facility] for tasks assessed/performed             Past Medical History:  Diagnosis Date   Cataract    Diabetes mellitus without complication ([place])    Glaucoma    Hyperlipidemia    Hypertension    Obesity    Wears dentures    full upper    Past Surgical History:  Procedure Laterality Date   BREAST BIOPSY Bilateral    rt/clip-02/27/03, left - all neg   BREAST LUMPECTOMY Bilateral 1982   neg   CATARACT EXTRACTION W/PHACO Right 07/29/2020   Procedure: CATARACT EXTRACTION PHACO AND INTRAOCULAR LENS PLACEMENT (IOC) RIGHT DIABETIC 4.08 00:33.1;  Surgeon: Birder Robson, MD;  Location: [place];  Service: Ophthalmology;  Laterality: Right;  Diabetic - oral meds   CATARACT EXTRACTION W/PHACO Left 08/12/2020   Procedure: CATARACT EXTRACTION PHACO AND INTRAOCULAR LENS PLACEMENT (IOC) LEFT DIABETIC 5.32 00:32.2;  Surgeon: Birder Robson, MD;  Location: [place];  Service: Ophthalmology;  Laterality: Left;  Diabetic - oral meds   FINE NEEDLE ASPIRATION Left 05/20/2020   Dr. Honor Junes thyroid -   [place]    There were no vitals filed for this visit.    Subjective Assessment - 06/15/21 0947     Subjective Pt. is 2 weeks post L hamstring strain. Pt. states she has a  history of sciatica and was moving a T.V. a few weeks ago when she felt pain go down her L leg. Pt. said the pain was severe for 3-4 days but has subsided to where there is no pain at rest. Pt. had some issues lying on L side and was unable to sleep for a few days post the onset of the injury but is now able to sleep comfortably.  No pain reported prior to PT evaluation today.    Limitations Lifting;Walking;House hold activities    Patient Stated Goals Pt. wants to be able to return to her normal activity level.    Currently in Pain? No/denies    Pain Score 0-No pain            Sensation: Grossly intact for all LE dermatomal testing.  Posture: Pt. has minor forward sitting posture and some increase in thoracic kyphosis.   ROM: Pt. Lumbar and knee ROM were all WNL.  MMT: R/L  Hip Flexion: 5/4+ Knee ext.: 5/4 Knee flexion: 4/4  Hip abduction: 4+/4+  Special tests:  FADIR: negative bilaterally SLR: negative bilaterally  See evaluation flow sheet     Objective measurements completed on examination: See above findings.       PT Education - 06/15/21 1009     Education Details Pt. was educated on proper  TrA contraction as well as HEP: [id number]    Person(s) Educated Patient    Methods Demonstration;Explanation    Comprehension Verbalized understanding                 PT Long Term Goals - 06/15/21 1015       PT LONG TERM GOAL #1   Title Pt. will increase FOTO score to 70 to improve strength and mobility during functional activity/ ADL's.    Baseline Initial FOTO: 60    Time 8    Period Weeks    Status New    Target Date 08/10/21      PT LONG TERM GOAL #2   Title Pt. will be able to acheive 4+/5 MMT for L knee flexion to allow her to more effectively complete her community ADL's    Baseline L hamstring 4/5 MMT    Time 8    Period Weeks    Status New    Target Date 08/10/21      PT LONG TERM GOAL #3   Title Pt. will be able to actively flex knee past 90  degrees in standing with no increase increase c/o distal hamstring pain to improve mobility.    Baseline Pt. limited to <90 deg. active knee flexion on L due to tighthness/ "pulling"    Time 8    Period Weeks    Status New    Target Date 08/11/21      PT LONG TERM GOAL #4   Title Pt. will demonstrate proper floor to waist lifting technique to prevent further back/LE issues.    Baseline TBD    Time 8    Period Weeks    Status New    Target Date 08/10/21                    Plan - 06/15/21 1535     Clinical Impression Statement Pt. is 81 y/o female 2 weeks post L hamstring strain.  Pt. reports no pain in back or L hamstring at this time. Pt. has a history of sciatica but was screened during i.e. with SLR to determine concordant sign was more localized coming from the hamstring strain. Pt. had severe pain on initial onset (05/28/2022) but has progressively been getting better each day.  Pt. presents with L LE muscle weakness (4/5 MMT) as compared to R LE.  FOTO: initial 60/ goal 70.  Pt. is highly motivated to return to ADLs and will greatly benefit from LE/ lumbar stretching and stability exercise in a pain-free range to progress her to full prior level of function.    Personal Factors and Comorbidities Age    Examination-Activity Limitations Bend;Caring for Others;Squat    Examination-Participation Restrictions Community Activity    Stability/Clinical Decision Making Evolving/Moderate complexity    Clinical Decision Making Moderate    Rehab Potential Good    PT Frequency 2x / week    PT Duration 8 weeks    PT Treatment/Interventions ADLs/Self Care Home Management;Moist Heat;Electrical Stimulation;Therapeutic activities;Gait training;Therapeutic exercise;Balance training;Neuromuscular re-education;Manual techniques;Passive range of motion;Cryotherapy;Stair training;Functional mobility training;Patient/family education    PT Next Visit Plan Reassess HEP and progress as tolerated.     PT Home Exercise Plan [email or web address]             Patient will benefit from skilled therapeutic intervention in order to improve the following deficits and impairments:  Pain, Impaired flexibility, Decreased strength, Decreased range of motion, Decreased mobility, Improper body mechanics, Decreased activity tolerance  Visit Diagnosis: Strain of left hamstring muscle, initial encounter  Sciatica, left side  Joint stiffness of knee, left  Muscle weakness (generalized)  Pain in left leg     Problem List Patient Active Problem List   Diagnosis Date Noted   Sensorineural hearing loss of combined sites, bilateral 11/19/2015   Glaucoma 08/24/2014   Cataract 08/24/2014   Dyslipidemia associated with type 2 diabetes mellitus ([place]) 08/24/2014   Hypertension associated with diabetes ([place]) 08/24/2014   Dyslipidemia 08/24/2014   Pura Spice, PT, DPT # [place], SPT 06/16/2021, 9:03 AM  [facility] [address] [place], [place], [address] Phone: [phone]   Fax:  [phone]  Name: Adrienne Anderson MRN: [id number] Date of Birth: 08-11-1940

## 2021-06-16 NOTE — Patient Instructions (Signed)
Access Code: [email or web address] URL: [email or web address] Date: 06/16/2021 Prepared by: Dorcas Carrow  Exercises Supine Hamstring Stretch - 1 x daily - 4 x weekly - 2 sets - 3 reps - 20 hold Supine Bridge - 1 x daily - 4 x weekly - 2 sets - 10 reps Hooklying Transversus Abdominis Palpation - 1 x daily - 4 x weekly - 2 sets - 10 reps Standing Knee Flexion AROM with Chair Support - 1 x daily - 4 x weekly - 2 sets - 10 reps

## 2021-06-17 ENCOUNTER — Other Ambulatory Visit: Payer: Self-pay

## 2021-06-17 ENCOUNTER — Ambulatory Visit: Payer: Medicare PPO | Admitting: Physical Therapy

## 2021-06-17 ENCOUNTER — Encounter: Payer: Self-pay | Admitting: Physical Therapy

## 2021-06-17 DIAGNOSIS — S76312A Strain of muscle, fascia and tendon of the posterior muscle group at thigh level, left thigh, initial encounter: Secondary | ICD-10-CM | POA: Diagnosis not present

## 2021-06-17 DIAGNOSIS — M79605 Pain in left leg: Secondary | ICD-10-CM

## 2021-06-17 DIAGNOSIS — M25662 Stiffness of left knee, not elsewhere classified: Secondary | ICD-10-CM | POA: Diagnosis not present

## 2021-06-17 DIAGNOSIS — M5410 Radiculopathy, site unspecified: Secondary | ICD-10-CM | POA: Diagnosis not present

## 2021-06-17 DIAGNOSIS — M5432 Sciatica, left side: Secondary | ICD-10-CM | POA: Diagnosis not present

## 2021-06-17 DIAGNOSIS — M6281 Muscle weakness (generalized): Secondary | ICD-10-CM | POA: Diagnosis not present

## 2021-06-17 NOTE — Therapy (Addendum)
[place] [facility] [address] [place], [place], [address] Phone: [phone]   Fax:  [phone]  Physical Therapy Treatment  Patient Details  Name: Adrienne Anderson MRN: [id number] Date of Birth: May 07, 1941 Referring Provider (PT): Dr. Steele Sizer   Encounter Date: 06/17/2021   PT End of Session - 06/17/21 1206     Visit Number 2    Number of Visits 16    Date for PT Re-Evaluation 08/10/21    Authorization - Visit Number 2    Authorization - Number of Visits 10    PT Start Time 0950    PT Stop Time 1040    PT Time Calculation (min) 50 min    Activity Tolerance Patient tolerated treatment well    Behavior During Therapy [facility] for tasks assessed/performed             Past Medical History:  Diagnosis Date   Cataract    Diabetes mellitus without complication ([place])    Glaucoma    Hyperlipidemia    Hypertension    Obesity    Wears dentures    full upper    Past Surgical History:  Procedure Laterality Date   BREAST BIOPSY Bilateral    rt/clip-02/27/03, left - all neg   BREAST LUMPECTOMY Bilateral 1982   neg   CATARACT EXTRACTION W/PHACO Right 07/29/2020   Procedure: CATARACT EXTRACTION PHACO AND INTRAOCULAR LENS PLACEMENT (IOC) RIGHT DIABETIC 4.08 00:33.1;  Surgeon: Birder Robson, MD;  Location: [place];  Service: Ophthalmology;  Laterality: Right;  Diabetic - oral meds   CATARACT EXTRACTION W/PHACO Left 08/12/2020   Procedure: CATARACT EXTRACTION PHACO AND INTRAOCULAR LENS PLACEMENT (IOC) LEFT DIABETIC 5.32 00:32.2;  Surgeon: Birder Robson, MD;  Location: [place];  Service: Ophthalmology;  Laterality: Left;  Diabetic - oral meds   FINE NEEDLE ASPIRATION Left 05/20/2020   Dr. Honor Junes thyroid -   [place]    There were no vitals filed for this visit.   Subjective Assessment - 06/17/21 0952     Subjective Pt. comes in reporting no L hamstring pain but does make note of a  flare up of sciatica on Tuesday.    Limitations Lifting;Walking;House hold activities    Patient Stated Goals Pt. wants to be able to return to her normal activity level.    Currently in Pain? No/denies    Pain Score 0-No pain              Ther. Ex.:  Nustep L5 8 minutes (seat 12)- B UE/LE.  Discussed HEP  Supine hooklying position: TrA contractions 1x10. Pt. Education to properly brace during there. Ex.  Minimal cuing provided for proper technique.    Supine Bridge 2x10  Seated LAQ 2x10 2#  Standing leg curl 2x10 1#. Pt. Felt a pull  of L LE at 2# and found 1# a challenge but comfortable.   Standing Hip ext./hip abd. 2x10 1#  Standing heel raises   Manual:  3x20 seconds hamstring/ gastro/ piriformis stretches in supine position.    Supine LE LAD 3x20secs      PT Long Term Goals - 06/15/21 1015       PT LONG TERM GOAL #1   Title Pt. will increase FOTO score to 70 to improve strength and mobility during functional activity/ ADL's.    Baseline Initial FOTO: 60    Time 8    Period Weeks    Status New    Target Date  08/10/21      PT LONG TERM GOAL #2   Title Pt. will be able to acheive 4+/5 MMT for L knee flexion to allow her to more effectively complete her community ADL's    Baseline L hamstring 4/5 MMT    Time 8    Period Weeks    Status New    Target Date 08/10/21      PT LONG TERM GOAL #3   Title Pt. will be able to actively flex knee past 90 degrees in standing with no increase increase c/o distal hamstring pain to improve mobility.    Baseline Pt. limited to <90 deg. active knee flexion on L due to tighthness/ "pulling"    Time 8    Period Weeks    Status New    Target Date 08/11/21      PT LONG TERM GOAL #4   Title Pt. will demonstrate proper floor to waist lifting technique to prevent further back/LE issues.    Baseline TBD    Time 8    Period Weeks    Status New    Target Date 08/10/21                   Plan - 06/17/21 1208      Clinical Impression Statement Pt. has no pain during session and while performing HEP. Pt. states she is avoiding any strenuous activities to reduce risk of reinjury. Pt. progressed to loaded strengthening exercises to increase tolerance and build confidence when performing LE exercises. Pt. was educated on Tra contraction and the benefits of contracting her core while lifting. Pt. will continue to benefit from increaseing loading on LE and updating HEP to allow her to be able to maintain her strength.    Personal Factors and Comorbidities Age    Examination-Activity Limitations Bend;Caring for Others;Squat    Examination-Participation Restrictions Community Activity    Stability/Clinical Decision Making Evolving/Moderate complexity    Clinical Decision Making Moderate    Rehab Potential Good    PT Frequency 2x / week    PT Duration 8 weeks    PT Treatment/Interventions ADLs/Self Care Home Management;Moist Heat;Electrical Stimulation;Therapeutic activities;Gait training;Therapeutic exercise;Balance training;Neuromuscular re-education;Manual techniques;Passive range of motion;Cryotherapy;Stair training;Functional mobility training;Patient/family education    PT Next Visit Plan Reassess HEP and progress as tolerated.  Check body mechanics.    PT Home Exercise Plan [id number]             Patient will benefit from skilled therapeutic intervention in order to improve the following deficits and impairments:  Pain, Impaired flexibility, Decreased strength, Decreased range of motion, Decreased mobility, Improper body mechanics, Decreased activity tolerance  Visit Diagnosis: Strain of left hamstring muscle, initial encounter  Pain in left leg  Sciatica, left side     Problem List Patient Active Problem List   Diagnosis Date Noted   Sensorineural hearing loss of combined sites, bilateral 11/19/2015   Glaucoma 08/24/2014   Cataract 08/24/2014   Dyslipidemia associated with type 2  diabetes mellitus ([place]) 08/24/2014   Hypertension associated with diabetes ([place]) 08/24/2014   Dyslipidemia 08/24/2014   Pura Spice, PT, DPT # [place], SPT 06/17/2021, 1:10 PM  [place] [facility] [address] [place], [place], [address] Phone: [phone]   Fax:  [phone]  Name: MARLICIA SROKA MRN: [id number] Date of Birth: 18-Mar-1941

## 2021-06-22 ENCOUNTER — Ambulatory Visit: Payer: Medicare PPO | Admitting: Physical Therapy

## 2021-06-22 ENCOUNTER — Encounter: Payer: Self-pay | Admitting: Physical Therapy

## 2021-06-22 ENCOUNTER — Other Ambulatory Visit: Payer: Self-pay

## 2021-06-22 DIAGNOSIS — M6281 Muscle weakness (generalized): Secondary | ICD-10-CM | POA: Diagnosis not present

## 2021-06-22 DIAGNOSIS — M79605 Pain in left leg: Secondary | ICD-10-CM

## 2021-06-22 DIAGNOSIS — M25662 Stiffness of left knee, not elsewhere classified: Secondary | ICD-10-CM | POA: Diagnosis not present

## 2021-06-22 DIAGNOSIS — M5432 Sciatica, left side: Secondary | ICD-10-CM | POA: Diagnosis not present

## 2021-06-22 DIAGNOSIS — M5410 Radiculopathy, site unspecified: Secondary | ICD-10-CM | POA: Diagnosis not present

## 2021-06-22 DIAGNOSIS — S76312A Strain of muscle, fascia and tendon of the posterior muscle group at thigh level, left thigh, initial encounter: Secondary | ICD-10-CM

## 2021-06-22 NOTE — Therapy (Addendum)
[place] [facility] [address] [place], [place], [address] Phone: [phone]   Fax:  [phone]  Physical Therapy Treatment  Patient Details  Name: Adrienne Anderson MRN: [id number] Date of Birth: 04-11-1941 Referring Provider (PT): Dr. Steele Sizer   Encounter Date: 06/22/2021   PT End of Session - 06/22/21 1236     Visit Number 3    Number of Visits 16    Date for PT Re-Evaluation 08/10/21    Authorization - Visit Number 3    Authorization - Number of Visits 10    PT Start Time 0945    PT Stop Time 1497    PT Time Calculation (min) 50 min    Activity Tolerance Patient tolerated treatment well    Behavior During Therapy [facility] for tasks assessed/performed             Past Medical History:  Diagnosis Date   Cataract    Diabetes mellitus without complication ([place])    Glaucoma    Hyperlipidemia    Hypertension    Obesity    Wears dentures    full upper    Past Surgical History:  Procedure Laterality Date   BREAST BIOPSY Bilateral    rt/clip-02/27/03, left - all neg   BREAST LUMPECTOMY Bilateral 1982   neg   CATARACT EXTRACTION W/PHACO Right 07/29/2020   Procedure: CATARACT EXTRACTION PHACO AND INTRAOCULAR LENS PLACEMENT (IOC) RIGHT DIABETIC 4.08 00:33.1;  Surgeon: Birder Robson, MD;  Location: [place];  Service: Ophthalmology;  Laterality: Right;  Diabetic - oral meds   CATARACT EXTRACTION W/PHACO Left 08/12/2020   Procedure: CATARACT EXTRACTION PHACO AND INTRAOCULAR LENS PLACEMENT (IOC) LEFT DIABETIC 5.32 00:32.2;  Surgeon: Birder Robson, MD;  Location: [place];  Service: Ophthalmology;  Laterality: Left;  Diabetic - oral meds   FINE NEEDLE ASPIRATION Left 05/20/2020   Dr. Honor Junes thyroid -   [place]    There were no vitals filed for this visit.   Subjective Assessment - 06/22/21 1418     Subjective Pt. comes in reporting no pain in L hamstring or low back. Pt.  helped clean her church over the weekend and had a small sciatica flare up down to L calf but not a major increase in pain.    Limitations Lifting;Walking;House hold activities    Patient Stated Goals Pt. wants to be able to return to her normal activity level.    Currently in Pain? No/denies              Ther. Ex.:   Nustep L5 8 minutes (seat 12)- B UE/LE.  Discussed HEP   Supine Bridge with clam shell. 2x10 GTB  Supine ball squeeze with bridge 2x10   Seated LAQ 2x10 3#   Standing leg curl 2x10 2#. Pt. Felt a pull of L LE but is instructed to complete exercise in pain free range.    Standing Hip ext./hip abd. 2x10 2#  Floor to waist box lifting to table (10# box) 2x5. Pt. Cued for proper biomechanics but struggles with proper form and not overloading low back. No       Manual:   3x20 seconds hamstring/ gastro/ piriformis stretches in supine position.     Supine LE LAD 3x20secs. Pt. Feels relief at the end of session       PT Long Term Goals - 06/15/21 1015       PT LONG TERM GOAL #1   Title Pt. will  increase FOTO score to 70 to improve strength and mobility during functional activity/ ADL's.    Baseline Initial FOTO: 60    Time 8    Period Weeks    Status New    Target Date 08/10/21      PT LONG TERM GOAL #2   Title Pt. will be able to acheive 4+/5 MMT for L knee flexion to allow her to more effectively complete her community ADL's    Baseline L hamstring 4/5 MMT    Time 8    Period Weeks    Status New    Target Date 08/10/21      PT LONG TERM GOAL #3   Title Pt. will be able to actively flex knee past 90 degrees in standing with no increase increase c/o distal hamstring pain to improve mobility.    Baseline Pt. limited to <90 deg. active knee flexion on L due to tighthness/ "pulling"    Time 8    Period Weeks    Status New    Target Date 08/11/21      PT LONG TERM GOAL #4   Title Pt. will demonstrate proper floor to waist lifting technique to  prevent further back/LE issues.    Baseline TBD    Time 8    Period Weeks    Status New    Target Date 08/10/21                 Plan - 06/22/21 1237     Clinical Impression Statement Pt. reports no pain during session. Pt. is continuing to benefit from strengthening exercises and is reporting less pain and fatigue with tx. Pt. states she feels PT is helping and discusses progressing to more functional activites so she can confidently perform her ADLs. Pt. demonstrates ability to pick up a box from the floor and put it on table but needs consistent cueing for proper biomechanics.  Pt. will benefit from therex. and education on the proper biomechanics to pick something up from the ground to prevent reinjury to back. Pt. is able to hinge at the hips and use legs but if the object is on the floor she leans far forward to put more strain on low back and would benefit from proper biomechanics to increase confidence when performing this activity. Pt. has a slight yellow flag when performing ADLs and is avoiding activites to reduce the risk of injury.    Personal Factors and Comorbidities Age    Examination-Activity Limitations Bend;Caring for Others;Squat    Examination-Participation Restrictions Community Activity    Stability/Clinical Decision Making Evolving/Moderate complexity    Clinical Decision Making Moderate    Rehab Potential Good    PT Frequency 2x / week    PT Duration 8 weeks    PT Treatment/Interventions ADLs/Self Care Home Management;Moist Heat;Electrical Stimulation;Therapeutic activities;Gait training;Therapeutic exercise;Balance training;Neuromuscular re-education;Manual techniques;Passive range of motion;Cryotherapy;Stair training;Functional mobility training;Patient/family education    PT Next Visit Plan Reassess HEP and progress as tolerated.  Floor to waist lifting.    PT Home Exercise Plan [email or web address]             Patient will benefit from skilled therapeutic  intervention in order to improve the following deficits and impairments:  Pain, Impaired flexibility, Decreased strength, Decreased range of motion, Decreased mobility, Improper body mechanics, Decreased activity tolerance  Visit Diagnosis: Strain of left hamstring muscle, initial encounter  Pain in left leg  Muscle weakness (generalized)     Problem List Patient Active Problem  List   Diagnosis Date Noted   Sensorineural hearing loss of combined sites, bilateral 11/19/2015   Glaucoma 08/24/2014   Cataract 08/24/2014   Dyslipidemia associated with type 2 diabetes mellitus ([place]) 08/24/2014   Hypertension associated with diabetes ([place]) 08/24/2014   Dyslipidemia 08/24/2014   Pura Spice, PT, DPT # [id number] [place], SPT 06/22/2021, 2:52 PM  [place] [facility] [address] [place], [place], [address] Phone: [phone]   Fax:  [phone]  Name: Adrienne Anderson MRN: [id number] Date of Birth: 1940/07/21

## 2021-06-24 ENCOUNTER — Encounter: Payer: Medicare PPO | Admitting: Physical Therapy

## 2021-06-25 ENCOUNTER — Other Ambulatory Visit: Payer: Self-pay

## 2021-06-25 ENCOUNTER — Ambulatory Visit: Payer: Medicare PPO | Attending: Family Medicine | Admitting: Physical Therapy

## 2021-06-25 ENCOUNTER — Encounter: Payer: Self-pay | Admitting: Physical Therapy

## 2021-06-25 DIAGNOSIS — M25662 Stiffness of left knee, not elsewhere classified: Secondary | ICD-10-CM | POA: Insufficient documentation

## 2021-06-25 DIAGNOSIS — M79605 Pain in left leg: Secondary | ICD-10-CM | POA: Insufficient documentation

## 2021-06-25 DIAGNOSIS — S76312A Strain of muscle, fascia and tendon of the posterior muscle group at thigh level, left thigh, initial encounter: Secondary | ICD-10-CM | POA: Diagnosis not present

## 2021-06-25 DIAGNOSIS — M6281 Muscle weakness (generalized): Secondary | ICD-10-CM | POA: Diagnosis not present

## 2021-06-25 NOTE — Patient Instructions (Signed)
Access Code: [email or web address] URL: [email or web address] Date: 06/25/2021 Prepared by: Dorcas Carrow  Exercises Seated Hamstring Stretch - 1 x daily - 4 x weekly - 3 sets - 10 reps Hooklying Clamshell with Resistance - 1 x daily - 4 x weekly - 3 sets - 10 reps Standing March with Counter Support - 1 x daily - 4 x weekly - 2 sets - 10 reps Standing Knee Flexion AROM with Chair Support - 1 x daily - 3 x weekly - 2 sets - 6 reps - 10 hold Mini Squat - 1 x daily - 4 x weekly - 2 sets - 10 reps

## 2021-06-25 NOTE — Therapy (Addendum)
[facility] [address]. [place], [place], [address] Phone: [phone]   Fax:  [phone]  Physical Therapy Treatment  Patient Details  Name: Adrienne Anderson MRN: [id number] Date of Birth: 1941/01/24 Referring Provider (PT): Dr. Steele Sizer   Encounter Date: 06/25/2021   PT End of Session - 06/25/21 0954     Visit Number 4    Number of Visits 16    Date for PT Re-Evaluation 08/10/21    Authorization - Visit Number 4    Authorization - Number of Visits 10    PT Start Time 2505    PT Stop Time 0947    PT Time Calculation (min) 52 min    Activity Tolerance Patient tolerated treatment well    Behavior During Therapy [facility] for tasks assessed/performed             Past Medical History:  Diagnosis Date   Cataract    Diabetes mellitus without complication ([place])    Glaucoma    Hyperlipidemia    Hypertension    Obesity    Wears dentures    full upper    Past Surgical History:  Procedure Laterality Date   BREAST BIOPSY Bilateral    rt/clip-02/27/03, left - all neg   BREAST LUMPECTOMY Bilateral 1982   neg   CATARACT EXTRACTION W/PHACO Right 07/29/2020   Procedure: CATARACT EXTRACTION PHACO AND INTRAOCULAR LENS PLACEMENT (IOC) RIGHT DIABETIC 4.08 00:33.1;  Surgeon: Birder Robson, MD;  Location: [place];  Service: Ophthalmology;  Laterality: Right;  Diabetic - oral meds   CATARACT EXTRACTION W/PHACO Left 08/12/2020   Procedure: CATARACT EXTRACTION PHACO AND INTRAOCULAR LENS PLACEMENT (IOC) LEFT DIABETIC 5.32 00:32.2;  Surgeon: Birder Robson, MD;  Location: [place];  Service: Ophthalmology;  Laterality: Left;  Diabetic - oral meds   FINE NEEDLE ASPIRATION Left 05/20/2020   Dr. Honor Junes thyroid -   [place]    There were no vitals filed for this visit.   Subjective Assessment - 06/25/21 0858     Subjective Pt. continues to come in with no L hamstring pain. Pt. was able to  garden Monday after tx. and states she had no pain or discomfort.    Pertinent History --    Limitations Lifting;Walking;House hold activities    Patient Stated Goals Pt. wants to be able to return to her normal activity level.    Currently in Pain? No/denies    Pain Score 0-No pain             Ther. Ex.:   Nustep L5 8 minutes (seat 12)- B UE/LE.  Discussed HEP  Walking in //-bars with high marching 4x  side stepping in //-bars  4x. Pt. Cued to IR R LE as her toe was pointing out.  Sitting in chair hamstring stretch. 3x20 secs    Supine Bridge with clam shell. 2x10 GTB    Standing leg curl 2x10 2#. Pt. Felt a pull of L LE but is instructed to complete exercise in pain free range and hold an isometric contraction.     Standing Hip ext./hip abd. 2x10 2#   Floor to waist box lifting to table (10# box) 2x5. Pt. Cued for proper biomechanics but struggles with proper form and not overloading low back.      Manual:   3x20 seconds hamstring/ gastro/ piriformis stretches in supine position.     Supine LE LAD 3x20secs. Pt. Feels relief at the end of session  PT Education - 06/25/21 1009     Education Details HEP: [id number]    Person(s) Educated Patient    Methods Demonstration;Explanation;Handout    Comprehension Verbalized understanding                 PT Long Term Goals - 06/15/21 1015       PT LONG TERM GOAL #1   Title Pt. will increase FOTO score to 70 to improve strength and mobility during functional activity/ ADL's.    Baseline Initial FOTO: 60    Time 8    Period Weeks    Status New    Target Date 08/10/21      PT LONG TERM GOAL #2   Title Pt. will be able to acheive 4+/5 MMT for L knee flexion to allow her to more effectively complete her community ADL's    Baseline L hamstring 4/5 MMT    Time 8    Period Weeks    Status New    Target Date 08/10/21      PT LONG TERM GOAL #3   Title Pt. will be able to actively flex knee past 90 degrees in  standing with no increase increase c/o distal hamstring pain to improve mobility.    Baseline Pt. limited to <90 deg. active knee flexion on L due to tighthness/ "pulling"    Time 8    Period Weeks    Status New    Target Date 08/11/21      PT LONG TERM GOAL #4   Title Pt. will demonstrate proper floor to waist lifting technique to prevent further back/LE issues.    Baseline TBD    Time 8    Period Weeks    Status New    Target Date 08/10/21                Plan - 06/25/21 0958     Clinical Impression Statement Pt. reports no pain during session but does feel a slight pull in L hamstring during some standing exercises. Pt. notes there is not any pain but expresses she does not like how different L hamstring feels from her R hamstring.  Pt. will benefit from progressing strength and dynamic exercises to increase durability as well as her confidence in performing functional activites. Pt. continues to work on functional tasks in the clinic but does require cueing when picking up objects from the floor and with her squat form.  Pt. will benefit from praciting her hip hinge and squat pattern with cueing from a PT. PT. discussed and educates the pt. on HEP and the benefits to continuing to build tolerance with her functional activities to avoid potential chronic pain.    Personal Factors and Comorbidities Age    Examination-Activity Limitations Bend;Caring for Others;Squat    Examination-Participation Restrictions Community Activity    Stability/Clinical Decision Making Evolving/Moderate complexity    Clinical Decision Making Moderate    Rehab Potential Good    PT Frequency 2x / week    PT Duration 8 weeks    PT Treatment/Interventions ADLs/Self Care Home Management;Moist Heat;Electrical Stimulation;Therapeutic activities;Gait training;Therapeutic exercise;Balance training;Neuromuscular re-education;Manual techniques;Passive range of motion;Cryotherapy;Stair training;Functional mobility  training;Patient/family education    PT Next Visit Plan Reassess HEP and progress as tolerated.  Floor to waist lifting.    PT Home Exercise Plan [id number]             Patient will benefit from skilled therapeutic intervention in order to improve the following deficits and impairments:  Pain, Impaired flexibility, Decreased strength, Decreased range of motion, Decreased mobility, Improper body mechanics, Decreased activity tolerance  Visit Diagnosis: Strain of left hamstring muscle, initial encounter  Muscle weakness (generalized)  Joint stiffness of knee, left     Problem List Patient Active Problem List   Diagnosis Date Noted   Sensorineural hearing loss of combined sites, bilateral 11/19/2015   Glaucoma 08/24/2014   Cataract 08/24/2014   Dyslipidemia associated with type 2 diabetes mellitus ([place]) 08/24/2014   Hypertension associated with diabetes ([place]) 08/24/2014   Dyslipidemia 08/24/2014   Pura Spice, PT, DPT # [id number] Cleopatra Cedar, SPT 06/25/2021, 10:22 AM  [place] [facility] [address] [place], [place], [address] Phone: [phone]   Fax:  [phone]  Name: Adrienne Anderson MRN: [id number] Date of Birth: February 15, 1941

## 2021-06-29 ENCOUNTER — Other Ambulatory Visit: Payer: Self-pay

## 2021-06-29 ENCOUNTER — Ambulatory Visit: Payer: Medicare PPO | Admitting: Physical Therapy

## 2021-06-29 ENCOUNTER — Encounter: Payer: Self-pay | Admitting: Physical Therapy

## 2021-06-29 DIAGNOSIS — M6281 Muscle weakness (generalized): Secondary | ICD-10-CM

## 2021-06-29 DIAGNOSIS — M25662 Stiffness of left knee, not elsewhere classified: Secondary | ICD-10-CM

## 2021-06-29 DIAGNOSIS — M79605 Pain in left leg: Secondary | ICD-10-CM | POA: Diagnosis not present

## 2021-06-29 DIAGNOSIS — S76312A Strain of muscle, fascia and tendon of the posterior muscle group at thigh level, left thigh, initial encounter: Secondary | ICD-10-CM | POA: Diagnosis not present

## 2021-06-29 NOTE — Therapy (Addendum)
[facility] [address] [place], [place], [address] Phone: [phone]   Fax:  [phone]  Physical Therapy Treatment  Patient Details  Name: Adrienne Anderson MRN: [id number] Date of Birth: 28-Nov-1940 Referring Provider (PT): Dr. Steele Sizer   Encounter Date: 06/29/2021   PT End of Session - 06/29/21 0946     Visit Number 5    Number of Visits 16    Date for PT Re-Evaluation 08/10/21    Authorization - Visit Number 5    Authorization - Number of Visits 10    PT Start Time 0939    PT Stop Time 1033    PT Time Calculation (min) 54 min    Activity Tolerance Patient tolerated treatment well    Behavior During Therapy [facility] for tasks assessed/performed             Past Medical History:  Diagnosis Date   Cataract    Diabetes mellitus without complication ([place])    Glaucoma    Hyperlipidemia    Hypertension    Obesity    Wears dentures    full upper    Past Surgical History:  Procedure Laterality Date   BREAST BIOPSY Bilateral    rt/clip-02/27/03, left - all neg   BREAST LUMPECTOMY Bilateral 1982   neg   CATARACT EXTRACTION W/PHACO Right 07/29/2020   Procedure: CATARACT EXTRACTION PHACO AND INTRAOCULAR LENS PLACEMENT (IOC) RIGHT DIABETIC 4.08 00:33.1;  Surgeon: Birder Robson, MD;  Location: [place];  Service: Ophthalmology;  Laterality: Right;  Diabetic - oral meds   CATARACT EXTRACTION W/PHACO Left 08/12/2020   Procedure: CATARACT EXTRACTION PHACO AND INTRAOCULAR LENS PLACEMENT (IOC) LEFT DIABETIC 5.32 00:32.2;  Surgeon: Birder Robson, MD;  Location: [place];  Service: Ophthalmology;  Laterality: Left;  Diabetic - oral meds   FINE NEEDLE ASPIRATION Left 05/20/2020   Dr. Honor Junes thyroid -   [place]    There were no vitals filed for this visit.   Subjective Assessment - 06/29/21 0944     Subjective Pt. reports no L hamstring pain. Pt. did have a small increase in R  sciatica pain Sunday night but states it subsided once she stood up and moved around.    Limitations Lifting;Walking;House hold activities    Patient Stated Goals Pt. wants to be able to return to her normal activity level.    Currently in Pain? No/denies    Pain Score 0-No pain              Ther. Ex.:   Nustep L5 8 minutes (seat 12)- B UE/LE.  Discussed weekend activities.     Walking in //-bars with high marching 5x 2# ankle wts.    Side stepping in //-bars  5x. Pt. Cued to IR R LE as her toe was pointing out. 2# ankle wts.   Hooklying bridge with ball squeeze 2x10    Sidelying Bridge with clam shell. 2x10 BlueTB    Standing leg curl 2x10 2#.  Pt. Feels no pull on L hamstring for the fist time since onset of injury.      Standing Hip ext./hip abd. 3x10 2#       Manual:   3x20 seconds hamstring/ gastro/ piriformis stretches in supine position.     Supine LE LAD 3x20secs. Pt. Feels relief at the end of session  STM on distal hamstring. Pt. Reports no palpable tenderness.       PT Long Term Goals -  06/15/21 1015       PT LONG TERM GOAL #1   Title Pt. will increase FOTO score to 70 to improve strength and mobility during functional activity/ ADL's.    Baseline Initial FOTO: 60    Time 8    Period Weeks    Status New    Target Date 08/10/21      PT LONG TERM GOAL #2   Title Pt. will be able to acheive 4+/5 MMT for L knee flexion to allow her to more effectively complete her community ADL's    Baseline L hamstring 4/5 MMT    Time 8    Period Weeks    Status New    Target Date 08/10/21      PT LONG TERM GOAL #3   Title Pt. will be able to actively flex knee past 90 degrees in standing with no increase increase c/o distal hamstring pain to improve mobility.    Baseline Pt. limited to <90 deg. active knee flexion on L due to tighthness/ "pulling"    Time 8    Period Weeks    Status New    Target Date 08/11/21      PT LONG TERM GOAL #4   Title Pt. will  demonstrate proper floor to waist lifting technique to prevent further back/LE issues.    Baseline TBD    Time 8    Period Weeks    Status New    Target Date 08/10/21                Plan - 06/29/21 1120     Clinical Impression Statement Pt. reports no pain prior to tx. session and has no increase in pain throughout all LE exercises. Pt. reports there is no L hamstring pull when doing standing exercises and is happy with her progress. Pt. will continue to benefit by increasing her tolerance to strength and dynamic exercises. Pt. will benefit from continuing to become more independant with HEP as she approaches d/c. Pt. states she is feeling better and would like to finish her POC in the next few visits. PT. educates on the importance of her HEP and to increase her activity level this next week to make sure she is ready to d/c.    Personal Factors and Comorbidities Age    Examination-Activity Limitations Bend;Caring for Others;Squat    Examination-Participation Restrictions Community Activity    Stability/Clinical Decision Making Evolving/Moderate complexity    Clinical Decision Making Moderate    Rehab Potential Good    PT Frequency 2x / week    PT Duration 8 weeks    PT Treatment/Interventions ADLs/Self Care Home Management;Moist Heat;Electrical Stimulation;Therapeutic activities;Gait training;Therapeutic exercise;Balance training;Neuromuscular re-education;Manual techniques;Passive range of motion;Cryotherapy;Stair training;Functional mobility training;Patient/family education    PT Next Visit Plan Reassess HEP and progress as tolerated.  Floor to waist lifting.    PT Home Exercise Plan [id number]             Patient will benefit from skilled therapeutic intervention in order to improve the following deficits and impairments:  Pain, Impaired flexibility, Decreased strength, Decreased range of motion, Decreased mobility, Improper body mechanics, Decreased activity tolerance  Visit  Diagnosis: Strain of left hamstring muscle, initial encounter  Muscle weakness (generalized)  Joint stiffness of knee, left     Problem List Patient Active Problem List   Diagnosis Date Noted   Sensorineural hearing loss of combined sites, bilateral 11/19/2015   Glaucoma 08/24/2014   Cataract 08/24/2014   Dyslipidemia  associated with type 2 diabetes mellitus ([place]) 08/24/2014   Hypertension associated with diabetes ([place]) 08/24/2014   Dyslipidemia 08/24/2014   Pura Spice, PT, DPT # [id number], SPT 06/29/2021, 12:12 PM  [facility] [address] [place], [place], [address] Phone: [phone]   Fax:  [phone]  Name: Adrienne Anderson MRN: [id number] Date of Birth: 1940-10-01

## 2021-07-01 ENCOUNTER — Ambulatory Visit: Payer: Medicare PPO | Admitting: Physical Therapy

## 2021-07-01 ENCOUNTER — Encounter: Payer: Self-pay | Admitting: Physical Therapy

## 2021-07-01 ENCOUNTER — Other Ambulatory Visit: Payer: Self-pay

## 2021-07-01 DIAGNOSIS — M79605 Pain in left leg: Secondary | ICD-10-CM | POA: Diagnosis not present

## 2021-07-01 DIAGNOSIS — S76312A Strain of muscle, fascia and tendon of the posterior muscle group at thigh level, left thigh, initial encounter: Secondary | ICD-10-CM | POA: Diagnosis not present

## 2021-07-01 DIAGNOSIS — M6281 Muscle weakness (generalized): Secondary | ICD-10-CM

## 2021-07-01 DIAGNOSIS — M25662 Stiffness of left knee, not elsewhere classified: Secondary | ICD-10-CM | POA: Diagnosis not present

## 2021-07-01 NOTE — Therapy (Addendum)
[facility] [address] [place], [place], [address] Phone: [phone]   Fax:  [phone]  Physical Therapy Treatment/DISCHARGE  Patient Details  Name: Adrienne Anderson MRN: [id number] Date of Birth: 02/26/1941 Referring Provider (PT): Dr. Steele Sizer   Encounter Date: 07/01/2021   PT End of Session - 07/01/21 0949     Visit Number 6    Number of Visits 16    Date for PT Re-Evaluation 08/10/21    Authorization - Visit Number 6    Authorization - Number of Visits 10    PT Start Time 0940    PT Stop Time 1028    PT Time Calculation (min) 48 min    Activity Tolerance Patient tolerated treatment well    Behavior During Therapy [facility] for tasks assessed/performed             Past Medical History:  Diagnosis Date   Cataract    Diabetes mellitus without complication ([place])    Glaucoma    Hyperlipidemia    Hypertension    Obesity    Wears dentures    full upper    Past Surgical History:  Procedure Laterality Date   BREAST BIOPSY Bilateral    rt/clip-02/27/03, left - all neg   BREAST LUMPECTOMY Bilateral 1982   neg   CATARACT EXTRACTION W/PHACO Right 07/29/2020   Procedure: CATARACT EXTRACTION PHACO AND INTRAOCULAR LENS PLACEMENT (IOC) RIGHT DIABETIC 4.08 00:33.1;  Surgeon: Birder Robson, MD;  Location: [place];  Service: Ophthalmology;  Laterality: Right;  Diabetic - oral meds   CATARACT EXTRACTION W/PHACO Left 08/12/2020   Procedure: CATARACT EXTRACTION PHACO AND INTRAOCULAR LENS PLACEMENT (IOC) LEFT DIABETIC 5.32 00:32.2;  Surgeon: Birder Robson, MD;  Location: [place];  Service: Ophthalmology;  Laterality: Left;  Diabetic - oral meds   FINE NEEDLE ASPIRATION Left 05/20/2020   Dr. Honor Junes thyroid -   [place]    There were no vitals filed for this visit.   Subjective Assessment - 07/01/21 0946     Subjective Pt. reports no L hamstring pain and is happy with how she  has progressed. Pt. states she feels ready for discharge.    Limitations Lifting;Walking;House hold activities    Patient Stated Goals Pt. wants to be able to return to her normal activity level.    Currently in Pain? No/denies             Ther. Ex.:   Nustep L5 8 minutes (seat 12)- B UE/LE.  Discussed weekend activities.     Walking in forward/backwards/ high marching //-bars 5x 3# ankle wts.    Side stepping in //-bars  5x. Pt. Cued to IR R LE as her toe was pointing out. 3# ankle wts.    Hooklying bridge with ball squeeze 2x10    Standing leg curl 2x10 3#.  Pt. Feels no pull on L hamstring for the fist time since onset of injury.      Standing Hip ext./hip abd. 3x10 3#     Manual:   3x20 seconds hamstring/ gastro/ piriformis stretches in supine position.     Supine LE LAD 3x20secs. Pt. Feels relief at the end of session    MMT: (R/L)  Knee ext.-5/4+ Knee flexion- 5/4+    PT Long Term Goals - 07/01/21 1009       PT LONG TERM GOAL #1   Title Pt. will increase FOTO score to 70 to improve strength and mobility during  functional activity/ ADL's.    Baseline Initial FOTO: 60 07/01/21: 67    Time 8    Period Weeks    Status Partially Met    Target Date 07/01/21      PT LONG TERM GOAL #2   Title Pt. will be able to acheive 4+/5 MMT for L knee flexion to allow her to more effectively complete her community ADL's    Baseline L hamstring 4/5 MMT L hamstring 07/01/2021: 4+/5    Time 8    Period Weeks    Status Achieved    Target Date 07/01/21      PT LONG TERM GOAL #3   Title Pt. will be able to actively flex knee past 90 degrees in standing with no increase increase c/o distal hamstring pain to improve mobility.    Baseline Pt. limited to <90 deg. active knee flexion on L due to tighthness/ "pulling"    Time 8    Period Weeks    Status Achieved    Target Date 07/01/21      PT LONG TERM GOAL #4   Title Pt. will demonstrate proper floor to waist lifting technique  to prevent further back/LE issues.    Baseline TBD    Time 8    Period Weeks    Status Achieved    Target Date 07/01/21                   Plan - 07/01/21 1218     Clinical Impression Statement Pt. reports no pain during or prior to tx. Pt. states she is happy with how she has progressed and feels she is capable of independently managing her exercise program.  Pt. has increased her gross LE MMT to at least 4+/5 and has no pain when performing functional activities. Pt has an updated FOTO score of 67. Pt. has partially met/achieved all of her goals and has responded well to PT interventions. Pt. will continue to stretch and strengthen her LE's through pain free AROM with her HEP so that she will continue to tolerate her ADLs.    Personal Factors and Comorbidities Age    Examination-Activity Limitations Bend;Caring for Others;Squat    Examination-Participation Restrictions Community Activity    Stability/Clinical Decision Making Evolving/Moderate complexity    Clinical Decision Making Moderate    Rehab Potential Good    PT Frequency 2x / week    PT Duration 8 weeks    PT Treatment/Interventions ADLs/Self Care Home Management;Moist Heat;Electrical Stimulation;Therapeutic activities;Gait training;Therapeutic exercise;Balance training;Neuromuscular re-education;Manual techniques;Passive range of motion;Cryotherapy;Stair training;Functional mobility training;Patient/family education    PT Next Visit Plan Pt. instructed to contact PT if any questions or issues.    PT Home Exercise Plan [id number]             Patient will benefit from skilled therapeutic intervention in order to improve the following deficits and impairments:  Pain, Impaired flexibility, Decreased strength, Decreased range of motion, Decreased mobility, Improper body mechanics, Decreased activity tolerance  Visit Diagnosis: Strain of left hamstring muscle, initial encounter  Muscle weakness (generalized)  Pain in  left leg     Problem List Patient Active Problem List   Diagnosis Date Noted   Sensorineural hearing loss of combined sites, bilateral 11/19/2015   Glaucoma 08/24/2014   Cataract 08/24/2014   Dyslipidemia associated with type 2 diabetes mellitus ([place]) 08/24/2014   Hypertension associated with diabetes ([place]) 08/24/2014   Dyslipidemia 08/24/2014   Pura Spice, PT, DPT # [id number],  SPT 07/01/2021, 2:00 PM  [facility] [address] [place], [place], [address] Phone: [phone]   Fax:  [phone]  Name: Adrienne Anderson MRN: [id number] Date of Birth: June 28, 1940

## 2021-07-06 ENCOUNTER — Encounter: Payer: Medicare PPO | Admitting: Physical Therapy

## 2021-07-08 ENCOUNTER — Encounter: Payer: Medicare PPO | Admitting: Physical Therapy

## 2021-07-09 ENCOUNTER — Encounter: Payer: Medicare PPO | Admitting: Physical Therapy

## 2021-07-13 ENCOUNTER — Encounter: Payer: Medicare PPO | Admitting: Physical Therapy

## 2021-07-15 ENCOUNTER — Encounter: Payer: Medicare PPO | Admitting: Physical Therapy

## 2021-07-17 DIAGNOSIS — E785 Hyperlipidemia, unspecified: Secondary | ICD-10-CM | POA: Diagnosis not present

## 2021-07-17 DIAGNOSIS — Z7985 Long-term (current) use of injectable non-insulin antidiabetic drugs: Secondary | ICD-10-CM | POA: Diagnosis not present

## 2021-07-17 DIAGNOSIS — H409 Unspecified glaucoma: Secondary | ICD-10-CM | POA: Diagnosis not present

## 2021-07-17 DIAGNOSIS — M8580 Other specified disorders of bone density and structure, unspecified site: Secondary | ICD-10-CM | POA: Diagnosis not present

## 2021-07-17 DIAGNOSIS — Z6828 Body mass index (BMI) 28.0-28.9, adult: Secondary | ICD-10-CM | POA: Diagnosis not present

## 2021-07-17 DIAGNOSIS — E663 Overweight: Secondary | ICD-10-CM | POA: Diagnosis not present

## 2021-07-17 DIAGNOSIS — E119 Type 2 diabetes mellitus without complications: Secondary | ICD-10-CM | POA: Diagnosis not present

## 2021-07-17 DIAGNOSIS — I10 Essential (primary) hypertension: Secondary | ICD-10-CM | POA: Diagnosis not present

## 2021-07-17 DIAGNOSIS — R32 Unspecified urinary incontinence: Secondary | ICD-10-CM | POA: Diagnosis not present

## 2021-07-20 ENCOUNTER — Encounter: Payer: Medicare PPO | Admitting: Physical Therapy

## 2021-07-22 ENCOUNTER — Encounter: Payer: Medicare PPO | Admitting: Physical Therapy

## 2021-07-23 ENCOUNTER — Encounter: Payer: Medicare PPO | Admitting: Physical Therapy

## 2021-07-27 ENCOUNTER — Encounter: Payer: Medicare PPO | Admitting: Physical Therapy

## 2021-07-29 ENCOUNTER — Encounter: Payer: Medicare PPO | Admitting: Physical Therapy

## 2021-08-03 ENCOUNTER — Encounter: Payer: Medicare PPO | Admitting: Physical Therapy

## 2021-08-05 ENCOUNTER — Encounter: Payer: Medicare PPO | Admitting: Physical Therapy

## 2021-08-10 ENCOUNTER — Encounter: Payer: Medicare PPO | Admitting: Physical Therapy

## 2021-08-12 ENCOUNTER — Encounter: Payer: Medicare PPO | Admitting: Physical Therapy

## 2021-08-20 DIAGNOSIS — E042 Nontoxic multinodular goiter: Secondary | ICD-10-CM | POA: Diagnosis not present

## 2021-09-21 DIAGNOSIS — D23121 Other benign neoplasm of skin of left upper eyelid, including canthus: Secondary | ICD-10-CM | POA: Diagnosis not present

## 2021-09-25 DIAGNOSIS — D23121 Other benign neoplasm of skin of left upper eyelid, including canthus: Secondary | ICD-10-CM | POA: Diagnosis not present

## 2021-09-28 ENCOUNTER — Other Ambulatory Visit: Payer: Self-pay | Admitting: Family Medicine

## 2021-09-28 DIAGNOSIS — E1169 Type 2 diabetes mellitus with other specified complication: Secondary | ICD-10-CM

## 2021-09-28 DIAGNOSIS — E785 Hyperlipidemia, unspecified: Secondary | ICD-10-CM

## 2021-09-28 NOTE — Telephone Encounter (Signed)
Medication Refill - Medication: rosuvastatin (CRESTOR) 40 MG tablet ? ?Pt only has medication left for tomorrow.  ? ?Has the patient contacted their pharmacy? No. No, always calls PCP.  ? ?(Agent: If no, request that the patient contact the pharmacy for the refill. If patient does not wish to contact the pharmacy document the reason why and proceed with request.) ? ? ?Preferred Pharmacy (with phone number or street name):  ?[place], [place]  ?[place] [address]  ?Phone: [phone] Fax: [phone]  ?Hours: Not open 24 hours  ? ?Has the patient been seen for an appointment in the last year OR does the patient have an upcoming appointment? Yes.   ? ?Agent: Please be advised that RX refills may take up to 3 business days. We ask that you follow-up with your pharmacy.  ?

## 2021-09-29 MED ORDER — ROSUVASTATIN CALCIUM 40 MG PO TABS: 40.0000 mg | ORAL_TABLET | Freq: Every day | ORAL | 2 refills | Status: DC

## 2021-09-29 MED ORDER — ROSUVASTATIN CALCIUM 40 MG PO TABS: 40.0000 mg | ORAL_TABLET | Freq: Every day | ORAL | 0 refills | Status: DC

## 2021-09-29 NOTE — Addendum Note (Signed)
Addended by: Cheral Almas on: 09/29/2021 02:17 PM ? ? Modules accepted: Orders ? ?

## 2021-09-29 NOTE — Telephone Encounter (Signed)
Spoke with Water quality scientist at [facility]. She states that they do not have rx that was sent and receipt confirmed by [facility] on 06/01/2021 #90 1 refill. ? ?Called pt. She is out of medication as of today. Pt would like short term supply [facility] [place]. ? ?

## 2021-09-29 NOTE — Telephone Encounter (Signed)
Sending [facility] day supply to [facility] as pt is out of medication. Will send 90 day supply to [place]. ?Requested Prescriptions  ?Pending Prescriptions Disp Refills  ?? rosuvastatin (CRESTOR) 40 MG tablet 30 tablet 0  ?  Sig: Take 1 tablet (40 mg total) by mouth daily.  ?  ? Cardiovascular:  Antilipid - Statins 2 Failed - 09/28/2021  3:13 PM  ?  ?  Failed - Lipid Panel in normal range within the last 12 months  ?  Cholesterol, Total  ?Date Value Ref Range Status  ?11/04/2015 154 100 - 199 mg/dL Final  ? ?Cholesterol  ?Date Value Ref Range Status  ?06/09/2021 133 <200 mg/dL Final  ? ?LDL Cholesterol (Calc)  ?Date Value Ref Range Status  ?06/09/2021 53 mg/dL (calc) Final  ?  Comment:  ?  Reference range: <100 ?Marland Kitchen ?Desirable range <100 mg/dL for primary prevention;   ?<70 mg/dL for patients with CHD or diabetic patients  ?with > or = 2 CHD risk factors. ?. ?LDL-C is now calculated using the Martin-Hopkins  ?calculation, which is a validated novel method providing  ?better accuracy than the Friedewald equation in the  ?estimation of LDL-C.  ?Cresenciano Genre et al. Annamaria Helling. 2446;286(38): 2061-2068  ?(http://education.[employer].com/faq/FAQ164) ?  ? ?HDL  ?Date Value Ref Range Status  ?06/09/2021 62 > OR = 50 mg/dL Final  ?11/04/2015 53 >39 mg/dL Final  ? ?Triglycerides  ?Date Value Ref Range Status  ?06/09/2021 99 <150 mg/dL Final  ? ?  ?  ?  Passed - Cr in normal range and within 360 days  ?  Creat  ?Date Value Ref Range Status  ?06/09/2021 0.74 0.60 - 1.00 mg/dL Final  ? ?Creatinine, Urine  ?Date Value Ref Range Status  ?06/09/2021 97 20 - 275 mg/dL Final  ?   ?  ?  Passed - Patient is not pregnant  ?  ?  Passed - Valid encounter within last 12 months  ?  Recent Outpatient Visits   ?      ? 3 months ago Dyslipidemia associated with type 2 diabetes mellitus ([place])  ? [facility] [place], Drue Stager, MD  ? 7 months ago Leg pain, posterior, left  ? [facility] [place], Drue Stager, MD  ? 9 months ago  Dyslipidemia associated with type 2 diabetes mellitus ([place])  ? [facility] Steele Sizer, MD  ? 1 year ago Vaginal itching  ? [facility] [place], Drue Stager, MD  ? 1 year ago Dyslipidemia associated with type 2 diabetes mellitus ([place])  ? [facility] Steele Sizer, MD  ?  ?  ?Future Appointments   ?        ? In 2 months Steele Sizer, MD [facility], [place]  ? In 3 months  [place]  ?  ? ?  ?  ?  ? ?

## 2021-10-15 ENCOUNTER — Other Ambulatory Visit: Payer: Self-pay | Admitting: Family Medicine

## 2021-10-15 DIAGNOSIS — Z1231 Encounter for screening mammogram for malignant neoplasm of breast: Secondary | ICD-10-CM

## 2021-11-23 ENCOUNTER — Other Ambulatory Visit: Payer: Self-pay | Admitting: Family Medicine

## 2021-11-23 DIAGNOSIS — E1169 Type 2 diabetes mellitus with other specified complication: Secondary | ICD-10-CM

## 2021-11-30 ENCOUNTER — Ambulatory Visit: Payer: Medicare PPO

## 2021-12-01 ENCOUNTER — Ambulatory Visit
Admission: RE | Admit: 2021-12-01 | Discharge: 2021-12-01 | Disposition: A | Discharge status: Home / Self Care | Payer: Medicare PPO | Source: Ambulatory Visit | Attending: Family Medicine | Admitting: Family Medicine

## 2021-12-01 DIAGNOSIS — Z1231 Encounter for screening mammogram for malignant neoplasm of breast: Secondary | ICD-10-CM | POA: Diagnosis not present
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
## 2021-12-08 ENCOUNTER — Ambulatory Visit: Payer: Medicare PPO | Admitting: Family Medicine

## 2021-12-08 ENCOUNTER — Encounter: Payer: Self-pay | Admitting: Family Medicine

## 2021-12-08 VITALS — BP 110/68 | HR 89 | Resp 16 | Ht 68.0 in | Wt 184.0 lb

## 2021-12-08 DIAGNOSIS — E1169 Type 2 diabetes mellitus with other specified complication: Secondary | ICD-10-CM | POA: Diagnosis not present

## 2021-12-08 DIAGNOSIS — M5410 Radiculopathy, site unspecified: Secondary | ICD-10-CM | POA: Diagnosis not present

## 2021-12-08 DIAGNOSIS — I10 Essential (primary) hypertension: Secondary | ICD-10-CM

## 2021-12-08 DIAGNOSIS — E042 Nontoxic multinodular goiter: Secondary | ICD-10-CM | POA: Insufficient documentation

## 2021-12-08 DIAGNOSIS — E1159 Type 2 diabetes mellitus with other circulatory complications: Secondary | ICD-10-CM | POA: Diagnosis not present

## 2021-12-08 DIAGNOSIS — I152 Hypertension secondary to endocrine disorders: Secondary | ICD-10-CM | POA: Diagnosis not present

## 2021-12-08 DIAGNOSIS — E785 Hyperlipidemia, unspecified: Secondary | ICD-10-CM | POA: Diagnosis not present

## 2021-12-08 DIAGNOSIS — M8588 Other specified disorders of bone density and structure, other site: Secondary | ICD-10-CM

## 2021-12-08 DIAGNOSIS — D708 Other neutropenia: Secondary | ICD-10-CM | POA: Diagnosis not present

## 2021-12-08 LAB — POCT GLYCOSYLATED HEMOGLOBIN (HGB A1C): Hemoglobin A1C: 6.8 % — AB (ref 4.0–5.6)

## 2021-12-08 MED ORDER — TELMISARTAN-HCTZ 40-12.5 MG PO TABS: 1.0000 | ORAL_TABLET | Freq: Every day | ORAL | 0 refills | Status: DC

## 2021-12-08 MED ORDER — SYNJARDY 12.5-1000 MG PO TABS: 1.0000 | ORAL_TABLET | Freq: Two times a day (BID) | ORAL | 1 refills | Status: DC

## 2021-12-31 ENCOUNTER — Ambulatory Visit (INDEPENDENT_AMBULATORY_CARE_PROVIDER_SITE_OTHER): Payer: Medicare PPO

## 2021-12-31 DIAGNOSIS — M8588 Other specified disorders of bone density and structure, other site: Secondary | ICD-10-CM

## 2021-12-31 DIAGNOSIS — Z0000 Encounter for general adult medical examination without abnormal findings: Secondary | ICD-10-CM | POA: Diagnosis not present

## 2021-12-31 DIAGNOSIS — Z13820 Encounter for screening for osteoporosis: Secondary | ICD-10-CM | POA: Diagnosis not present

## 2021-12-31 NOTE — Progress Notes (Signed)
Subjective:  I connected with  Adrienne Anderson on 12/31/21 by a audio enabled telemedicine application and verified that I am speaking with the correct person using two identifiers.  Patient Location: Home  Provider Location: Office/Clinic  I discussed the limitations of evaluation and management by telemedicine. The patient expressed understanding and agreed to proceed.  Adrienne Anderson is a 81 y.o. female who presents for Medicare Annual (Subsequent) preventive examination.  Review of Systems    Defer to PCP       Objective:    There were no vitals filed for this visit. There is no height or weight on file to calculate BMI.     05/28/2021    3:16 PM 12/30/2020   11:00 AM 08/12/2020    6:37 AM 07/29/2020    6:41 AM 12/27/2019   10:55 AM 12/22/2018   11:06 AM 12/20/2017    8:42 AM  Advanced Directives  Does Patient Have a Medical Advance Directive? No Yes Yes Yes Yes Yes Yes  Type of Social research officer, government;Living will [place];Living will [place];Living will [place];Living will [place];Living will Living will;Healthcare Power of Attorney  Does patient want to make changes to medical advance directive?   No - Guardian declined No - Guardian declined  No - Patient declined   Copy of [place] in Chart?  No - copy requested No - copy requested No - copy requested No - copy requested No - copy requested No - copy requested    Current Medications (verified) Outpatient Encounter Medications as of 12/31/2021  Medication Sig   brimonidine (ALPHAGAN) 0.15 % ophthalmic solution Place 1 drop into both eyes 2 (two) times daily. x30 days   Empagliflozin-metFORMIN HCl (SYNJARDY) 12.09-998 MG TABS Take 1 tablet by mouth 2 (two) times daily.   glucose blood (ONE TOUCH ULTRA TEST) test strip Use as instructed   latanoprost (XALATAN) 0.005 % ophthalmic solution Apply to eye.    MULTIPLE VITAMIN PO Take by mouth.   rosuvastatin (CRESTOR) 40 MG tablet Take 1 tablet (40 mg total) by mouth daily.   telmisartan-hydrochlorothiazide (MICARDIS HCT) 40-12.5 MG tablet Take 1 tablet by mouth daily.   VITAMIN D PO Take 1,000 Units by mouth daily.   No facility-administered encounter medications on file as of 12/31/2021.    Allergies (verified) Clindamycin/lincomycin   History: Past Medical History:  Diagnosis Date   Cataract    Diabetes mellitus without complication ([place])    Glaucoma    Hyperlipidemia    Hypertension    Obesity    Wears dentures    full upper   Past Surgical History:  Procedure Laterality Date   BREAST BIOPSY Bilateral    rt/clip-02/27/03, left - all neg   BREAST EXCISIONAL BIOPSY Right 1982   CATARACT EXTRACTION W/PHACO Right 07/29/2020   Procedure: CATARACT EXTRACTION PHACO AND INTRAOCULAR LENS PLACEMENT (IOC) RIGHT DIABETIC 4.08 00:33.1;  Surgeon: Birder Robson, MD;  Location: [facility];  Service: Ophthalmology;  Laterality: Right;  Diabetic - oral meds   CATARACT EXTRACTION W/PHACO Left 08/12/2020   Procedure: CATARACT EXTRACTION PHACO AND INTRAOCULAR LENS PLACEMENT (IOC) LEFT DIABETIC 5.32 00:32.2;  Surgeon: Birder Robson, MD;  Location: [facility];  Service: Ophthalmology;  Laterality: Left;  Diabetic - oral meds   FINE NEEDLE ASPIRATION Left 05/20/2020   Dr. Honor Junes thyroid -   VAGINAL HYSTERECTOMY  1986   Family History  Problem Relation Age of Onset   Depression Mother    Rheum arthritis Father    Hypertension Sister    Breast cancer Sister    Alcohol abuse Brother    Hypertension Sister    Heart disease Sister    Cancer Sister        lung   Hypertension Sister    Kidney disease Sister    COPD Sister    Heart failure Sister    Breast cancer Cousin        pat cousin   Hypertension Sister    Breast cancer Paternal Aunt    Social History   Socioeconomic History   Marital status: Widowed     Spouse name: Leane Para   Number of children: 2   Years of education: Not on file   Highest education level: Bachelor's degree (e.g., BA, AB, BS)  Occupational History   Occupation: Retired  Tobacco Use   Smoking status: Never   Smokeless tobacco: Never   Tobacco comments:    smoking cessation materials not required  Vaping Use   Vaping Use: Never used  Substance and Sexual Activity   Alcohol use: No    Alcohol/week: 0.0 standard drinks of alcohol   Drug use: No   Sexual activity: Not Currently    Partners: Male  Other Topics Concern   Not on file  Social History Narrative   Widow since 2020, two children in [place] Strain: Low Risk  (12/30/2020)   Overall Financial Resource Strain (CARDIA)    Difficulty of Paying Living Expenses: Not hard at all  Food Insecurity: No Food Insecurity (12/30/2020)   Hunger Vital Sign    Worried About Running Out of Food in the Last Year: Never true    [place] in the Last Year: Never true  Transportation Needs: No Transportation Needs (12/30/2020)   PRAPARE - Hydrologist (Medical): No    Lack of Transportation (Non-Medical): No  Physical Activity: Inactive (12/30/2020)   Exercise Vital Sign    Days of Exercise per Week: 0 days    Minutes of Exercise per Session: 0 min  Stress: No Stress Concern Present (12/30/2020)   [place]    Feeling of Stress : Not at all  Social Connections: Moderately Integrated (12/30/2020)   Social Connection and Isolation Panel [NHANES]    Frequency of Communication with Friends and Family: More than three times a week    Frequency of Social Gatherings with Friends and Family: More than three times a week    Attends Religious Services: More than 4 times per year    Active Member of [employer] or Organizations: Yes    Attends Archivist Meetings: More than 4  times per year    Marital Status: Widowed    Tobacco Counseling Counseling given: Not Answered Tobacco comments: smoking cessation materials not required   Clinical Intake:                 Diabetic?yes         Activities of Daily Living    12/08/2021    8:05 AM 06/09/2021    8:27 AM  In your present state of health, do you have any difficulty performing the following activities:  Hearing? 0 0  Vision? 0 0  Difficulty concentrating or making decisions? 0 0  Walking or climbing stairs? 0  0  Dressing or bathing? 0 0  Doing errands, shopping? 0 0    Patient Care Team: Steele Sizer, MD as PCP - General (Family Medicine) Birder Robson, MD as Consulting Physician (Ophthalmology)  Indicate any recent Medical Services you may have received from other than [facility] providers in the past year (date may be approximate).     Assessment:   This is a routine wellness examination for Siana.  Hearing/Vision screen No results found.  Dietary issues and exercise activities discussed:     Goals Addressed   None   Depression Screen    12/08/2021    8:05 AM 06/09/2021    8:27 AM 02/24/2021    2:02 PM 12/30/2020   10:59 AM 12/05/2020    9:30 AM 08/27/2020    1:49 PM 05/29/2020    8:06 AM  PHQ 2/9 Scores  PHQ - 2 Score 0 0 0 0 0 0 0  PHQ- 9 Score 0 0         Fall Risk    12/08/2021    8:05 AM 06/09/2021    8:27 AM 02/24/2021    2:01 PM 12/30/2020   11:01 AM 12/05/2020    9:30 AM  Fall Risk   Falls in the past year? 0 0 0 0 0  Number falls in past yr: 0 0 0 0 0  Injury with Fall? 0 0 0 0 0  Risk for fall due to : No Fall Risks No Fall Risks No Fall Risks No Fall Risks   Follow up Falls prevention discussed Falls prevention discussed Falls prevention discussed Falls prevention discussed     FALL RISK PREVENTION PERTAINING TO THE HOME:  Any stairs in or around the home? Yes  If so, are there any without handrails? Yes  Home free of loose throw rugs in walkways,  pet beds, electrical cords, etc? Yes  Adequate lighting in your home to reduce risk of falls? Yes   ASSISTIVE DEVICES UTILIZED TO PREVENT FALLS:  Life alert? No  Use of a cane, walker or w/c? No  Grab bars in the bathroom? Yes  Shower chair or bench in shower? Yes  Elevated toilet seat or a handicapped toilet? Yes   TIMED UP AND GO:  Was the test performed? No .  Length of time to ambulate 10 feet: n/a sec.     Cognitive Function:        12/27/2019   10:57 AM 12/22/2018   11:09 AM 12/20/2017    8:43 AM  6CIT Screen  What Year? 0 points 0 points 0 points  What month? 0 points 0 points 0 points  What time? 0 points 0 points 0 points  Count back from 20 0 points 0 points 0 points  Months in reverse 0 points 0 points 0 points  Repeat phrase 0 points 0 points 2 points  Total Score 0 points 0 points 2 points    Immunizations Immunization History  Administered Date(s) Administered   Fluad Quad(high Dose 65+) 01/24/2019, 02/24/2021   Influenza Split 03/25/2014   Influenza, High Dose Seasonal PF 03/10/2016, 03/28/2017, 02/02/2018, 03/07/2020   Influenza, Seasonal, Injecte, Preservative Fre 05/03/2012   Influenza,inj,Quad PF,6+ Mos 03/06/2015   Influenza-Unspecified 05/03/2012   Moderna Sars-Covid-2 Vaccination 06/29/2019, 07/27/2019, 03/19/2020, 09/10/2020   Pneumococcal Conjugate-13 10/30/2014   Pneumococcal Polysaccharide-23 05/03/2012   Tdap 08/03/2012, 03/05/2020   Zoster Recombinat (Shingrix) 02/02/2018, 04/04/2018   Zoster, Live 11/06/2012    TDAP status: Up to date  Flu Vaccine status:  Up to date  Pneumococcal vaccine status: Up to date  Yes   Qualifies for Shingles Vaccine? No   Zostavax completed No   Shingrix Completed?: Yes  Screening Tests Health Maintenance  Topic Date Due   COVID-19 Vaccine (5 - Moderna risk series) 11/05/2020   INFLUENZA VACCINE  12/22/2021   OPHTHALMOLOGY EXAM  03/25/2022   FOOT EXAM  06/09/2022   HEMOGLOBIN A1C  06/10/2022    MAMMOGRAM  12/02/2022   TETANUS/TDAP  03/05/2030   Pneumonia Vaccine 25+ Years old  Completed   DEXA SCAN  Completed   Hepatitis C Screening  Completed   Zoster Vaccines- Shingrix  Completed   HPV VACCINES  Aged Out   Fecal DNA (Cologuard)  Discontinued    Health Maintenance  Health Maintenance Due  Topic Date Due   COVID-19 Vaccine (5 - Moderna risk series) 11/05/2020   INFLUENZA VACCINE  12/22/2021    Colorectal cancer screening: Type of screening: Cologuard. Completed 02/10/2018. Repeat every 5 years  Mammogram status: Completed 12/01/2021. Repeat every year  Bone Density status: Completed 02/13/2018. Results reflect: Bone density results: OSTEOPOROSIS. Repeat every 2 years.  Lung Cancer Screening: (Low Dose CT Chest recommended if Age 4-80 years, 30 pack-year currently smoking OR have quit w/in 15years.) does not qualify.   Lung Cancer Screening Referral: n/a  Additional Screening:  Hepatitis C Screening: does not qualify; Completed 06/09/2021  Vision Screening: Recommended annual ophthalmology exams for early detection of glaucoma and other disorders of the eye. Is the patient up to date with their annual eye exam?  Yes  Who is the provider or what is the name of the office in which the patient attends annual eye exams? Dr. Lu Duffel If pt is not established with a provider, would they like to be referred to a provider to establish care? No .   Dental Screening: Recommended annual dental exams for proper oral hygiene  Community Resource Referral / Chronic Care Management: CRR required this visit?  No   CCM required this visit?  No      Plan:     I have personally reviewed and noted the following in the patient's chart:   Medical and social history Use of alcohol, tobacco or illicit drugs  Current medications and supplements including opioid prescriptions.  Functional ability and status Nutritional status Physical activity Advanced directives List of  other physicians Hospitalizations, surgeries, and ER visits in previous 12 months Vitals Screenings to include cognitive, depression, and falls Referrals and appointments  In addition, I have reviewed and discussed with patient certain preventive protocols, quality metrics, and best practice recommendations. A written personalized care plan for preventive services as well as general preventive health recommendations were provided to patient.     Royal Hawthorn, [place]   12/31/2021   Nurse Notes:Non Face to Face minutes spent 30.  Ms. Heffner , Thank you for taking time to come for your Medicare Wellness Visit. I appreciate your ongoing commitment to your health goals. Please review the following plan we discussed and let me know if I can assist you in the future.   These are the goals we discussed:  Goals      DIET - INCREASE WATER INTAKE     Recommend to drink at least 6-8 8oz glasses of water per day.        This is a list of the screening recommended for you and due dates:  Health Maintenance  Topic Date Due   COVID-19 Vaccine (5 -  Moderna risk series) 11/05/2020   Flu Shot  12/22/2021   Eye exam for diabetics  03/25/2022   Complete foot exam   06/09/2022   Hemoglobin A1C  06/10/2022   Mammogram  12/02/2022   Tetanus Vaccine  03/05/2030   Pneumonia Vaccine  Completed   DEXA scan (bone density measurement)  Completed   Hepatitis C Screening: USPSTF Recommendation to screen - Ages 85-79 yo.  Completed   Zoster (Shingles) Vaccine  Completed   HPV Vaccine  Aged Out   Cologuard (Stool DNA test)  Discontinued

## 2022-01-26 ENCOUNTER — Ambulatory Visit
Admission: RE | Admit: 2022-01-26 | Discharge: 2022-01-26 | Disposition: A | Discharge status: Home / Self Care | Payer: Medicare PPO | Source: Ambulatory Visit | Attending: Family Medicine | Admitting: Family Medicine

## 2022-01-26 DIAGNOSIS — Z780 Asymptomatic menopausal state: Secondary | ICD-10-CM | POA: Diagnosis not present

## 2022-01-26 DIAGNOSIS — Z13820 Encounter for screening for osteoporosis: Secondary | ICD-10-CM

## 2022-01-26 DIAGNOSIS — M8588 Other specified disorders of bone density and structure, other site: Secondary | ICD-10-CM | POA: Diagnosis not present

## 2022-01-26 DIAGNOSIS — Z0000 Encounter for general adult medical examination without abnormal findings: Secondary | ICD-10-CM

## 2022-01-26 DIAGNOSIS — M8589 Other specified disorders of bone density and structure, multiple sites: Secondary | ICD-10-CM | POA: Diagnosis not present

## 2022-03-22 ENCOUNTER — Other Ambulatory Visit: Payer: Self-pay | Admitting: Family Medicine

## 2022-03-22 DIAGNOSIS — E1159 Type 2 diabetes mellitus with other circulatory complications: Secondary | ICD-10-CM

## 2022-03-22 NOTE — Telephone Encounter (Signed)
[facility] pharmacy calling to ask the pt get a short term supply of telmisartan-hydrochlorothiazide (MICARDIS HCT) 40-12.5 MG tablet 15 day Pt is out of medication, and just requested regular refill today.  Sent to  [id number] [place]- [address]

## 2022-03-29 ENCOUNTER — Other Ambulatory Visit: Payer: Self-pay | Admitting: Family Medicine

## 2022-03-29 DIAGNOSIS — I152 Hypertension secondary to endocrine disorders: Secondary | ICD-10-CM

## 2022-03-29 NOTE — Telephone Encounter (Signed)
Medication Refill - Medication: telmisartan-hydrochlorothiazide (MICARDIS HCT) 40-12.5 MG tablet [[id number]]  Pt is completely out of medication  Has the patient contacted their pharmacy? Yes.   (Agent: If no, request that the patient contact the pharmacy for the refill. If patient does not wish to contact the pharmacy document the reason why and proceed with request.) (Agent: If yes, when and what did the pharmacy advise?)  Preferred Pharmacy (with phone number or street name):  [facility], [place]  [place] [address]  Phone: [phone] Fax: [phone]  Hours: Not open 24 hours   Has the patient been seen for an appointment in the last year OR does the patient have an upcoming appointment? Yes.    Agent: Please be adv that RX refills may take up to 3 business days. We ask that you follow-up with your pharmacy.

## 2022-03-29 NOTE — Telephone Encounter (Signed)
Requested medication (s) are due for refill today: yes  Requested medication (s) are on the active medication list: yes  Last refill:  12/08/21 #90/0  Future visit scheduled: yes 04/12/22  Notes to clinic:  needing updated labs, pt is completely out of medication and requesting short supply until appt.      Requested Prescriptions  Pending Prescriptions Disp Refills   telmisartan-hydrochlorothiazide (MICARDIS HCT) 40-12.5 MG tablet 90 tablet 0    Sig: Take 1 tablet by mouth daily.     Cardiovascular: ARB + Diuretic Combos Failed - 03/29/2022  5:40 PM      Failed - K in normal range and within 180 days    Potassium  Date Value Ref Range Status  06/09/2021 4.0 3.5 - 5.3 mmol/L Final         Failed - Na in normal range and within 180 days    Sodium  Date Value Ref Range Status  06/09/2021 143 135 - 146 mmol/L Final  11/04/2015 143 134 - 144 mmol/L Final         Failed - Cr in normal range and within 180 days    Creat  Date Value Ref Range Status  06/09/2021 0.74 0.60 - 1.00 mg/dL Final   Creatinine, Urine  Date Value Ref Range Status  06/09/2021 97 20 - 275 mg/dL Final         Failed - eGFR is 10 or above and within 180 days    GFR, Est African American  Date Value Ref Range Status  05/29/2020 91 > OR = 60 mL/min/1.25m Final   GFR, Est Non African American  Date Value Ref Range Status  05/29/2020 78 > OR = 60 mL/min/1.772mFinal   eGFR  Date Value Ref Range Status  06/09/2021 83 > OR = 60 mL/min/1.7381minal    Comment:    The eGFR is based on the CKD-EPI 2021 equation. To calculate  the new eGFR from a previous Creatinine or Cystatin C result, go to https://www.kidney.org/professionals/ kdoqi/gfr%5Fcalculator          Passed - Patient is not pregnant      Passed - Last BP in normal range    BP Readings from Last 1 Encounters:  12/08/21 110/68         Passed - Valid encounter within last 6 months    Recent Outpatient Visits           3 months ago  [facility]   9 months ago [facility] with [facility]   1 year ago Leg pain, posterior, left   [facility]   1 year [facility] with [facility]   1 year ago [facility]inal itching   [facility]       Future Appointments             In 2 weeks [facility]

## 2022-03-29 NOTE — Telephone Encounter (Signed)
Medication Refill - Medication: telmisartan-hydrochlorothiazide (MICARDIS HCT) 40-12.5 MG tablet [[id number]]   Requesting a 10 day dose to be sent to the pharmacy  Has the patient contacted their pharmacy? Yes.   (Agent: If no, request that the patient contact the pharmacy for the refill. If patient does not wish to contact the pharmacy document the reason why and proceed with request.) (Agent: If yes, when and what did the pharmacy advise?)  Preferred Pharmacy (with phone number or street name) [id number] [place] [address] Phone: [phone]: [phone] Hours: Not open 24 hours   Has the patient been seen for an appointment in the last year OR does the patient have an upcoming appointment? Yes.    Agent: Please be advised that RX refills may take up to 3 business days. We ask that you follow-up with your pharmacy.

## 2022-03-30 MED ORDER — TELMISARTAN-HCTZ 40-12.5 MG PO TABS: 1.0000 | ORAL_TABLET | Freq: Every day | ORAL | 0 refills | Status: DC

## 2022-04-02 DIAGNOSIS — H401132 Primary open-angle glaucoma, bilateral, moderate stage: Secondary | ICD-10-CM | POA: Diagnosis not present

## 2022-04-02 LAB — HM DIABETES EYE EXAM

## 2022-04-09 IMAGING — US US THYROID
1 series · 13 of 25 positions shown · non-contrast
Comparison: None available

CLINICAL DATA: Left thyroid nodule by previous outside imaging.

EXAM:
THYROID ULTRASOUND
TECHNIQUE: Ultrasound examination of the thyroid gland and adjacent soft
tissues was performed.

[Series 1: us thyroid · 0.06mm/px · 13 of 64 slices shown]
[im 1/64]
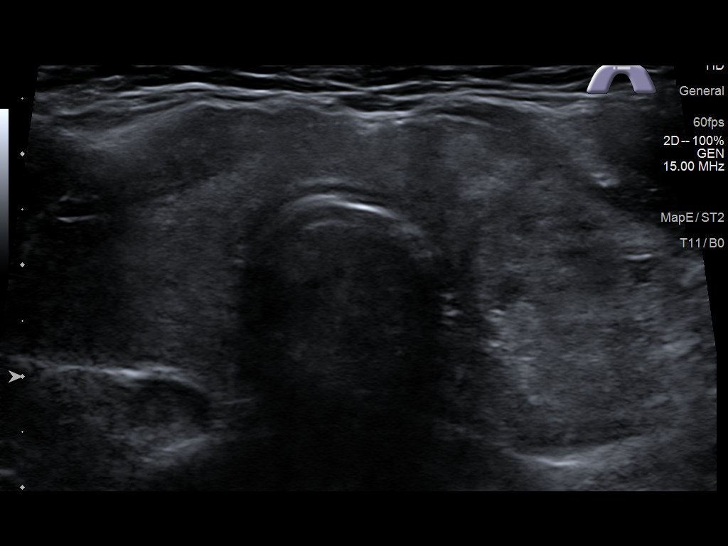
[im 6/64]
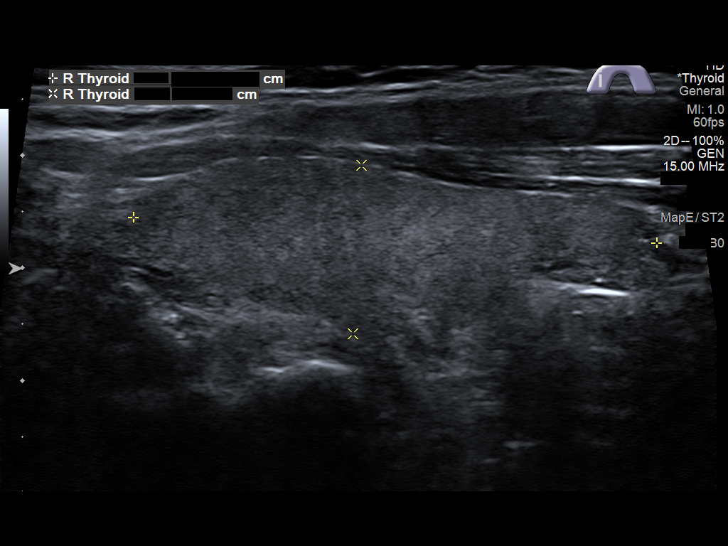
[im 11/64]
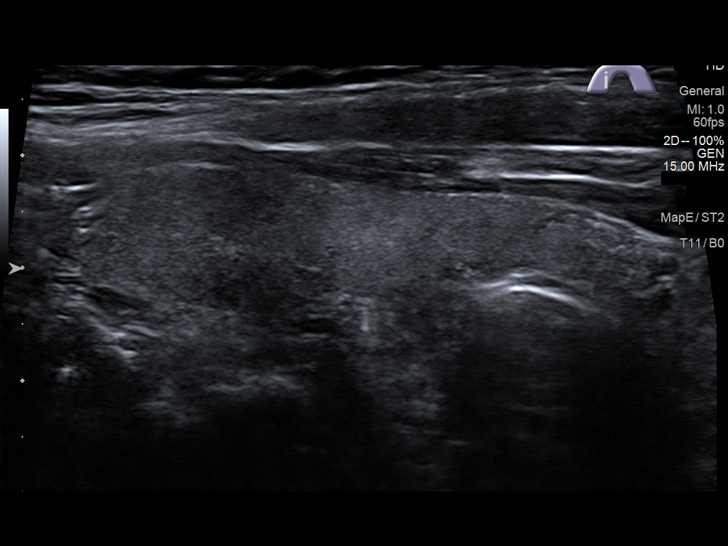
[im 16/64]
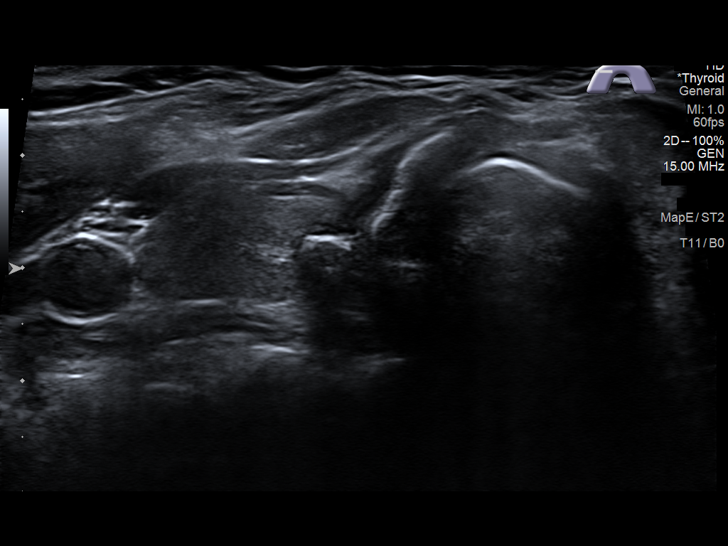
[im 22/64]
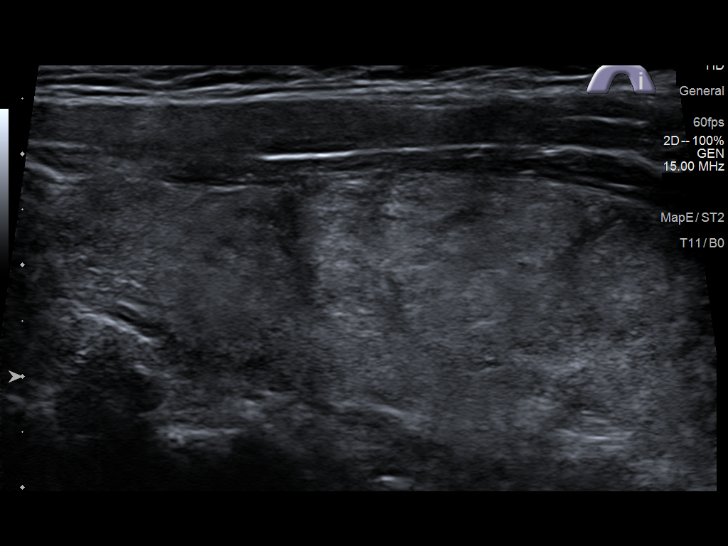
[im 27/64]
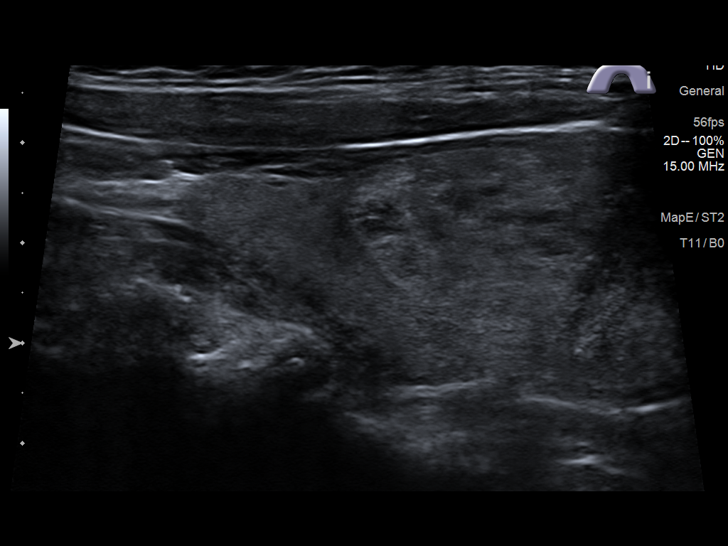
[im 32/64]
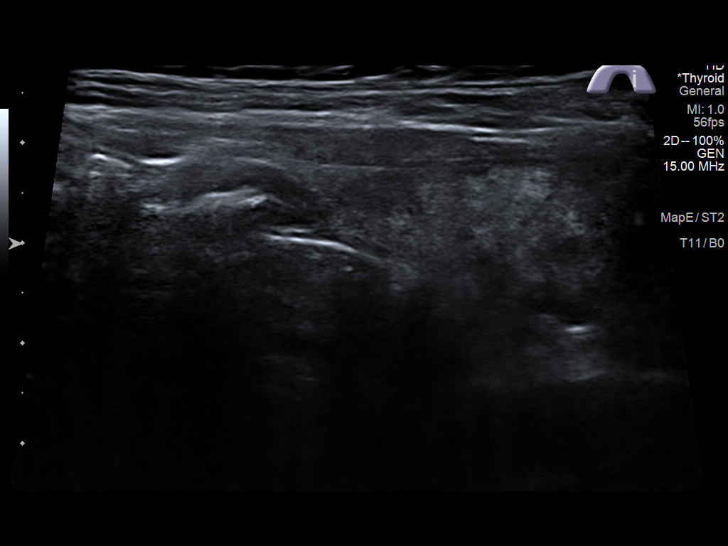
[im 37/64]
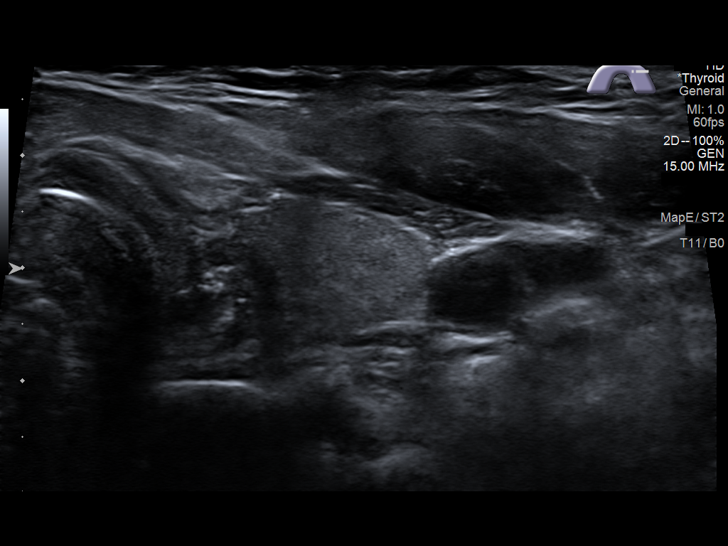
[im 43/64]
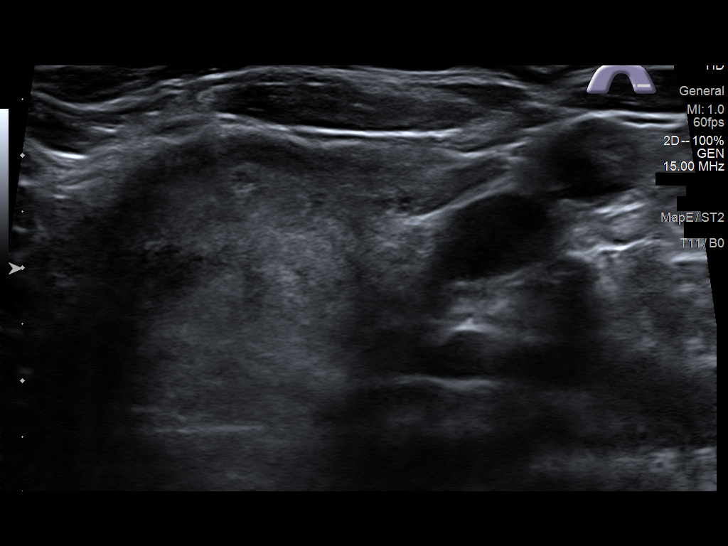
[im 48/64]
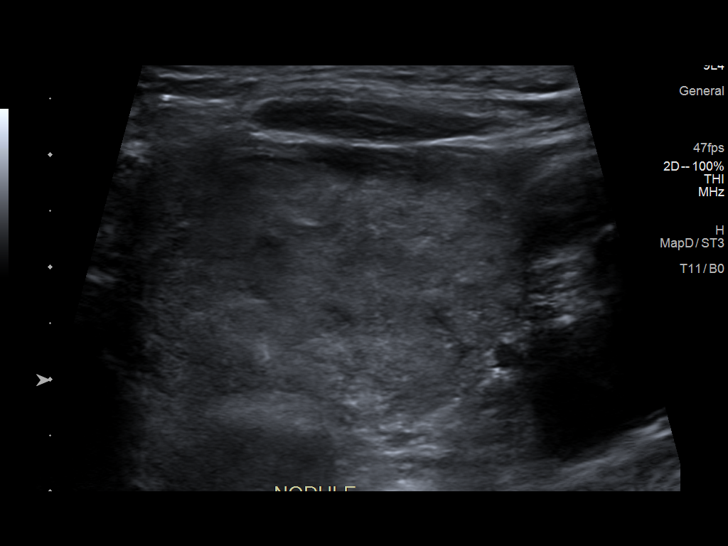
[im 53/64]
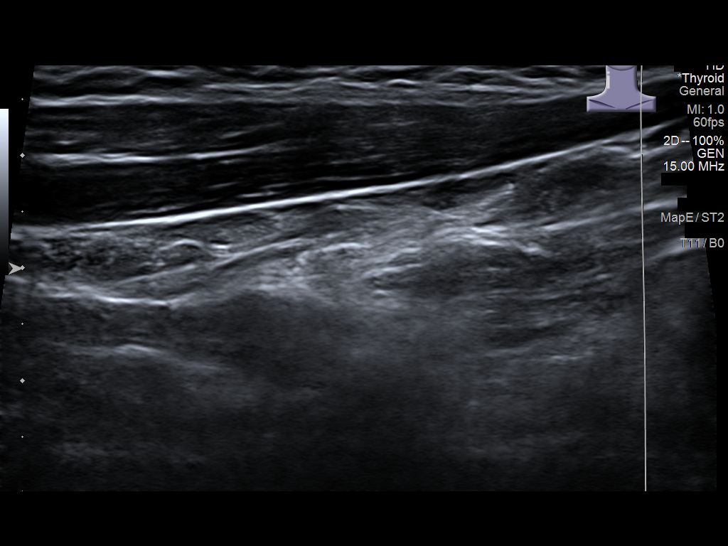
[im 58/64]
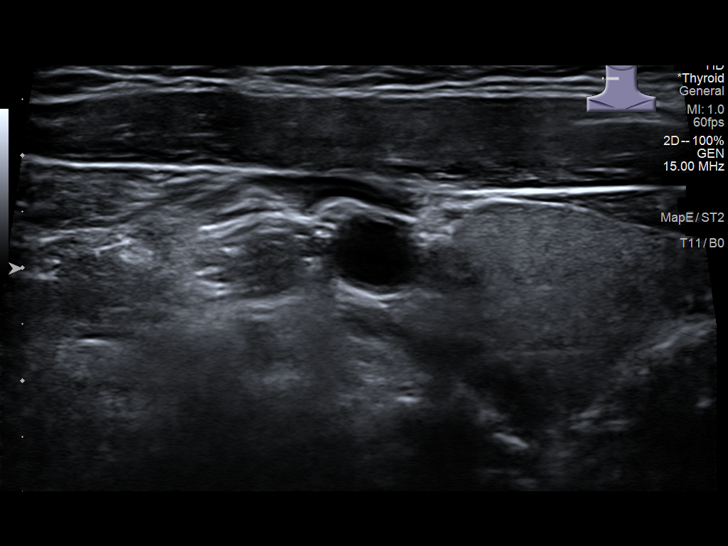
[im 64/64]
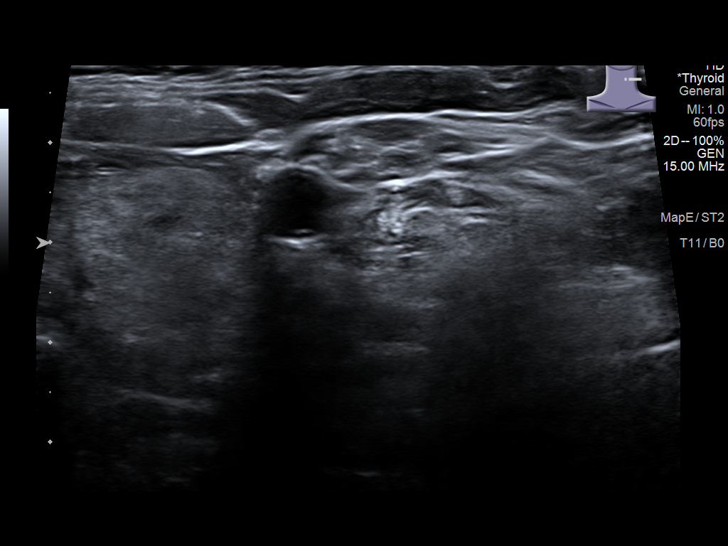

[13 of 25 positions shown; findings below may reference images not displayed]

FINDINGS: Parenchymal Echotexture: Mildly heterogenous

Isthmus: 6 mm

Right lobe: 4.7 x 1.5 x 2.0 cm

Left lobe: 6.0 x 2.3 x 2.4 cm

_________________________________________________________

Estimated total number of nodules >/= 1 cm: 1

Number of spongiform nodules >/=  2 cm not described below (TR1): 0

Number of mixed cystic and solid nodules >/= 1.5 cm not described
below (TR2): 0

_________________________________________________________

Nodule # 1:

Location: Left; Inferior

Maximum size: 4.4 cm; Other 2 dimensions: 3.5 x 2.3 cm

Composition: solid/almost completely solid (2)

Echogenicity: isoechoic (1)

Shape: not taller-than-wide (0)

Margins: ill-defined (0)

Echogenic foci: none (0)

ACR TI-RADS total points: 3.

ACR TI-RADS risk category: TR3 (3 points).

ACR TI-RADS recommendations:

**Given size (>/= 2.5 cm) and appearance, fine needle aspiration of
this mildly suspicious nodule should be considered based on TI-RADS
criteria.

_________________________________________________________

No significant right thyroid abnormality. No hypervascularity or
regional adenopathy.
IMPRESSION: 4.4 cm left inferior TR 3 nodule meets criteria for biopsy as above.

The above is in keeping with the ACR TI-RADS recommendations - [HOSPITAL] 4561;[DATE].
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
## 2022-04-12 ENCOUNTER — Encounter: Payer: Self-pay | Admitting: Family Medicine

## 2022-04-12 ENCOUNTER — Ambulatory Visit: Payer: Medicare PPO | Admitting: Family Medicine

## 2022-04-12 VITALS — BP 120/70 | HR 84 | Resp 16 | Ht 68.0 in | Wt 175.0 lb

## 2022-04-12 DIAGNOSIS — E785 Hyperlipidemia, unspecified: Secondary | ICD-10-CM

## 2022-04-12 DIAGNOSIS — D708 Other neutropenia: Secondary | ICD-10-CM | POA: Diagnosis not present

## 2022-04-12 DIAGNOSIS — R252 Cramp and spasm: Secondary | ICD-10-CM | POA: Diagnosis not present

## 2022-04-12 DIAGNOSIS — R634 Abnormal weight loss: Secondary | ICD-10-CM | POA: Diagnosis not present

## 2022-04-12 DIAGNOSIS — I152 Hypertension secondary to endocrine disorders: Secondary | ICD-10-CM | POA: Diagnosis not present

## 2022-04-12 DIAGNOSIS — M5410 Radiculopathy, site unspecified: Secondary | ICD-10-CM | POA: Diagnosis not present

## 2022-04-12 DIAGNOSIS — E042 Nontoxic multinodular goiter: Secondary | ICD-10-CM | POA: Diagnosis not present

## 2022-04-12 DIAGNOSIS — E1159 Type 2 diabetes mellitus with other circulatory complications: Secondary | ICD-10-CM

## 2022-04-12 DIAGNOSIS — E1169 Type 2 diabetes mellitus with other specified complication: Secondary | ICD-10-CM

## 2022-04-12 LAB — POCT GLYCOSYLATED HEMOGLOBIN (HGB A1C): Hemoglobin A1C: 6.3 % — AB (ref 4.0–5.6)

## 2022-04-12 MED ORDER — SYNJARDY 12.5-500 MG PO TABS: 1.0000 | ORAL_TABLET | Freq: Two times a day (BID) | ORAL | 1 refills | Status: DC

## 2022-04-12 MED ORDER — TELMISARTAN-HCTZ 40-12.5 MG PO TABS: 1.0000 | ORAL_TABLET | Freq: Every day | ORAL | 0 refills | Status: DC

## 2022-04-13 LAB — MAGNESIUM: Magnesium: 1.9 mg/dL (ref 1.5–2.5)

## 2022-04-13 LAB — CBC WITH DIFFERENTIAL/PLATELET
Absolute Monocytes: 315 cells/uL (ref 200–950)
Basophils Absolute: 20 cells/uL (ref 0–200)
Basophils Relative: 0.8 %
Eosinophils Absolute: 60 cells/uL (ref 15–500)
Eosinophils Relative: 2.4 %
HCT: 35.7 % (ref 35.0–45.0)
Hemoglobin: 11.9 g/dL (ref 11.7–15.5)
Lymphs Abs: 1225 cells/uL (ref 850–3900)
MCH: 31.3 pg (ref 27.0–33.0)
MCHC: 33.3 g/dL (ref 32.0–36.0)
MCV: 93.9 fL (ref 80.0–100.0)
MPV: 11 fL (ref 7.5–12.5)
Monocytes Relative: 12.6 %
Neutro Abs: 880 cells/uL — ABNORMAL LOW (ref 1500–7800)
Neutrophils Relative %: 35.2 %
Platelets: 196 10*3/uL (ref 140–400)
RBC: 3.8 10*6/uL (ref 3.80–5.10)
RDW: 13 % (ref 11.0–15.0)
Total Lymphocyte: 49 %
WBC: 2.5 10*3/uL — ABNORMAL LOW (ref 3.8–10.8)

## 2022-04-13 LAB — COMPLETE METABOLIC PANEL WITH GFR
AG Ratio: 1.4 (calc) (ref 1.0–2.5)
ALT: 25 U/L (ref 6–29)
AST: 21 U/L (ref 10–35)
Albumin: 4 g/dL (ref 3.6–5.1)
Alkaline phosphatase (APISO): 64 U/L (ref 37–153)
BUN: 11 mg/dL (ref 7–25)
CO2: 31 mmol/L (ref 20–32)
Calcium: 9.1 mg/dL (ref 8.6–10.4)
Chloride: 106 mmol/L (ref 98–110)
Creat: 0.65 mg/dL (ref 0.60–1.00)
Globulin: 2.8 g/dL (calc) (ref 1.9–3.7)
Glucose, Bld: 100 mg/dL — ABNORMAL HIGH (ref 65–99)
Potassium: 4.1 mmol/L (ref 3.5–5.3)
Sodium: 143 mmol/L (ref 135–146)
Total Bilirubin: 0.6 mg/dL (ref 0.2–1.2)
Total Protein: 6.8 g/dL (ref 6.1–8.1)
eGFR: 90 mL/min/{1.73_m2} (ref >=60)

## 2022-04-13 LAB — IRON,TIBC AND FERRITIN PANEL
%SAT: 28 % (calc) (ref 16–45)
Ferritin: 227 ng/mL (ref 16–288)
Iron: 73 ug/dL (ref 45–160)
TIBC: 259 mcg/dL (calc) (ref 250–450)

## 2022-04-13 LAB — TSH: TSH: 1.41 mIU/L (ref 0.40–4.50)

## 2022-05-03 DIAGNOSIS — E042 Nontoxic multinodular goiter: Secondary | ICD-10-CM | POA: Diagnosis not present

## 2022-05-08 ENCOUNTER — Other Ambulatory Visit: Payer: Self-pay | Admitting: Family Medicine

## 2022-05-08 DIAGNOSIS — E785 Hyperlipidemia, unspecified: Secondary | ICD-10-CM

## 2022-05-08 DIAGNOSIS — E1169 Type 2 diabetes mellitus with other specified complication: Secondary | ICD-10-CM

## 2022-05-13 DIAGNOSIS — E042 Nontoxic multinodular goiter: Secondary | ICD-10-CM | POA: Diagnosis not present
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
## 2022-06-14 ENCOUNTER — Ambulatory Visit: Payer: Medicare PPO | Admitting: Family Medicine

## 2022-06-14 ENCOUNTER — Encounter: Payer: Self-pay | Admitting: Family Medicine

## 2022-06-14 VITALS — BP 116/72 | HR 97 | Temp 97.8°F | Resp 16 | Ht 68.0 in | Wt 174.9 lb

## 2022-06-14 DIAGNOSIS — G2581 Restless legs syndrome: Secondary | ICD-10-CM | POA: Diagnosis not present

## 2022-06-14 DIAGNOSIS — E1159 Type 2 diabetes mellitus with other circulatory complications: Secondary | ICD-10-CM | POA: Diagnosis not present

## 2022-06-14 DIAGNOSIS — E785 Hyperlipidemia, unspecified: Secondary | ICD-10-CM | POA: Diagnosis not present

## 2022-06-14 DIAGNOSIS — I152 Hypertension secondary to endocrine disorders: Secondary | ICD-10-CM

## 2022-06-14 DIAGNOSIS — I10 Essential (primary) hypertension: Secondary | ICD-10-CM

## 2022-06-14 DIAGNOSIS — E1169 Type 2 diabetes mellitus with other specified complication: Secondary | ICD-10-CM

## 2022-06-14 DIAGNOSIS — M8588 Other specified disorders of bone density and structure, other site: Secondary | ICD-10-CM | POA: Diagnosis not present

## 2022-06-14 DIAGNOSIS — M5410 Radiculopathy, site unspecified: Secondary | ICD-10-CM | POA: Diagnosis not present

## 2022-06-14 DIAGNOSIS — D708 Other neutropenia: Secondary | ICD-10-CM

## 2022-06-14 DIAGNOSIS — E042 Nontoxic multinodular goiter: Secondary | ICD-10-CM

## 2022-06-14 MED ORDER — TELMISARTAN 40 MG PO TABS: 40.0000 mg | ORAL_TABLET | Freq: Every day | ORAL | 0 refills | Status: DC

## 2022-06-15 LAB — CBC WITH DIFFERENTIAL/PLATELET
Absolute Monocytes: 420 cells/uL (ref 200–950)
Basophils Absolute: 30 cells/uL (ref 0–200)
Basophils Relative: 1 %
Eosinophils Absolute: 51 cells/uL (ref 15–500)
Eosinophils Relative: 1.7 %
HCT: 38.2 % (ref 35.0–45.0)
Hemoglobin: 12.7 g/dL (ref 11.7–15.5)
Lymphs Abs: 1074 cells/uL (ref 850–3900)
MCH: 31.4 pg (ref 27.0–33.0)
MCHC: 33.2 g/dL (ref 32.0–36.0)
MCV: 94.3 fL (ref 80.0–100.0)
MPV: 10.7 fL (ref 7.5–12.5)
Monocytes Relative: 14 %
Neutro Abs: 1425 cells/uL — ABNORMAL LOW (ref 1500–7800)
Neutrophils Relative %: 47.5 %
Platelets: 210 10*3/uL (ref 140–400)
RBC: 4.05 10*6/uL (ref 3.80–5.10)
RDW: 12.9 % (ref 11.0–15.0)
Total Lymphocyte: 35.8 %
WBC: 3 10*3/uL — ABNORMAL LOW (ref 3.8–10.8)

## 2022-06-15 LAB — LIPID PANEL
Cholesterol: 137 mg/dL (ref <200)
HDL: 65 mg/dL (ref >=50)
LDL Cholesterol (Calc): 47 mg/dL (calc)
Non-HDL Cholesterol (Calc): 72 mg/dL (calc) (ref <130)
Total CHOL/HDL Ratio: 2.1 (calc) (ref <5.0)
Triglycerides: 171 mg/dL — ABNORMAL HIGH (ref <150)

## 2022-06-15 LAB — COMPLETE METABOLIC PANEL WITH GFR
AG Ratio: 1.5 (calc) (ref 1.0–2.5)
ALT: 27 U/L (ref 6–29)
AST: 20 U/L (ref 10–35)
Albumin: 4.3 g/dL (ref 3.6–5.1)
Alkaline phosphatase (APISO): 68 U/L (ref 37–153)
BUN: 13 mg/dL (ref 7–25)
CO2: 29 mmol/L (ref 20–32)
Calcium: 9.4 mg/dL (ref 8.6–10.4)
Chloride: 104 mmol/L (ref 98–110)
Creat: 0.66 mg/dL (ref 0.60–1.00)
Globulin: 2.9 g/dL (calc) (ref 1.9–3.7)
Glucose, Bld: 100 mg/dL — ABNORMAL HIGH (ref 65–99)
Potassium: 3.8 mmol/L (ref 3.5–5.3)
Sodium: 142 mmol/L (ref 135–146)
Total Bilirubin: 0.6 mg/dL (ref 0.2–1.2)
Total Protein: 7.2 g/dL (ref 6.1–8.1)
eGFR: 89 mL/min/{1.73_m2} (ref >=60)

## 2022-06-15 LAB — MICROALBUMIN / CREATININE URINE RATIO
Creatinine, Urine: 63 mg/dL (ref 20–275)
Microalb Creat Ratio: 35 mcg/mg creat — ABNORMAL HIGH (ref <30)
Microalb, Ur: 2.2 mg/dL

## 2022-06-15 LAB — FERRITIN: Ferritin: 188 ng/mL (ref 16–288)
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
## 2022-08-12 ENCOUNTER — Ambulatory Visit: Payer: Medicare PPO | Admitting: Family Medicine

## 2022-08-12 ENCOUNTER — Encounter: Payer: Self-pay | Admitting: Family Medicine

## 2022-08-12 VITALS — BP 118/68 | HR 94 | Resp 16 | Ht 68.0 in | Wt 175.0 lb

## 2022-08-12 DIAGNOSIS — I152 Hypertension secondary to endocrine disorders: Secondary | ICD-10-CM | POA: Diagnosis not present

## 2022-08-12 DIAGNOSIS — E1159 Type 2 diabetes mellitus with other circulatory complications: Secondary | ICD-10-CM | POA: Diagnosis not present

## 2022-08-12 DIAGNOSIS — M5410 Radiculopathy, site unspecified: Secondary | ICD-10-CM

## 2022-08-12 DIAGNOSIS — D708 Other neutropenia: Secondary | ICD-10-CM | POA: Diagnosis not present

## 2022-08-12 DIAGNOSIS — E785 Hyperlipidemia, unspecified: Secondary | ICD-10-CM | POA: Diagnosis not present

## 2022-08-12 DIAGNOSIS — M8588 Other specified disorders of bone density and structure, other site: Secondary | ICD-10-CM

## 2022-08-12 DIAGNOSIS — E1169 Type 2 diabetes mellitus with other specified complication: Secondary | ICD-10-CM

## 2022-08-12 DIAGNOSIS — G2581 Restless legs syndrome: Secondary | ICD-10-CM

## 2022-08-12 DIAGNOSIS — E042 Nontoxic multinodular goiter: Secondary | ICD-10-CM

## 2022-08-12 LAB — POCT GLYCOSYLATED HEMOGLOBIN (HGB A1C): Hemoglobin A1C: 6.3 % — AB (ref 4.0–5.6)

## 2022-08-12 MED ORDER — TELMISARTAN 20 MG PO TABS: 20.0000 mg | ORAL_TABLET | Freq: Every day | ORAL | 0 refills | Status: DC

## 2022-09-15 ENCOUNTER — Other Ambulatory Visit: Payer: Self-pay | Admitting: Family Medicine

## 2022-09-15 DIAGNOSIS — E1159 Type 2 diabetes mellitus with other circulatory complications: Secondary | ICD-10-CM

## 2022-09-24 ENCOUNTER — Other Ambulatory Visit: Payer: Self-pay | Admitting: Family Medicine

## 2022-09-24 DIAGNOSIS — E1169 Type 2 diabetes mellitus with other specified complication: Secondary | ICD-10-CM

## 2022-09-24 DIAGNOSIS — E1159 Type 2 diabetes mellitus with other circulatory complications: Secondary | ICD-10-CM

## 2022-09-30 DIAGNOSIS — E119 Type 2 diabetes mellitus without complications: Secondary | ICD-10-CM | POA: Diagnosis not present

## 2022-09-30 DIAGNOSIS — H401132 Primary open-angle glaucoma, bilateral, moderate stage: Secondary | ICD-10-CM | POA: Diagnosis not present

## 2022-10-04 ENCOUNTER — Other Ambulatory Visit: Payer: Self-pay | Admitting: Family Medicine

## 2022-10-04 DIAGNOSIS — I152 Hypertension secondary to endocrine disorders: Secondary | ICD-10-CM

## 2022-11-01 ENCOUNTER — Other Ambulatory Visit: Payer: Self-pay | Admitting: Family Medicine

## 2022-11-01 DIAGNOSIS — E1159 Type 2 diabetes mellitus with other circulatory complications: Secondary | ICD-10-CM

## 2022-11-04 ENCOUNTER — Other Ambulatory Visit: Payer: Self-pay | Admitting: Family Medicine

## 2022-11-04 DIAGNOSIS — Z1231 Encounter for screening mammogram for malignant neoplasm of breast: Secondary | ICD-10-CM

## 2022-11-07 ENCOUNTER — Other Ambulatory Visit: Payer: Self-pay | Admitting: Family Medicine

## 2022-11-07 DIAGNOSIS — E1159 Type 2 diabetes mellitus with other circulatory complications: Secondary | ICD-10-CM

## 2022-12-06 ENCOUNTER — Ambulatory Visit
Admission: RE | Admit: 2022-12-06 | Discharge: 2022-12-06 | Disposition: A | Discharge status: Home / Self Care | Payer: Medicare PPO | Source: Ambulatory Visit | Attending: Family Medicine | Admitting: Family Medicine

## 2022-12-06 DIAGNOSIS — Z1231 Encounter for screening mammogram for malignant neoplasm of breast: Secondary | ICD-10-CM | POA: Insufficient documentation

## 2022-12-09 ENCOUNTER — Ambulatory Visit: Payer: Medicare PPO

## 2022-12-09 VITALS — Ht 68.0 in | Wt 175.0 lb

## 2022-12-09 DIAGNOSIS — Z0000 Encounter for general adult medical examination without abnormal findings: Secondary | ICD-10-CM

## 2022-12-09 NOTE — Patient Instructions (Signed)
Adrienne Anderson , Thank you for taking time to come for your Medicare Wellness Visit. I appreciate your ongoing commitment to your health goals. Please review the following plan we discussed and let me know if I can assist you in the future.   These are the goals we discussed:  Goals      DIET - INCREASE WATER INTAKE     Recommend to drink at least 6-8 8oz glasses of water per day.        This is a list of the screening recommended for you and due dates:  Health Maintenance  Topic Date Due   COVID-19 Vaccine (5 - 2023-24 season) 05/15/2022   Flu Shot  12/23/2022   Hemoglobin A1C  02/12/2023   Eye exam for diabetics  04/03/2023   Yearly kidney function blood test for diabetes  06/15/2023   Yearly kidney health urinalysis for diabetes  06/15/2023   Complete foot exam   06/15/2023   Mammogram  12/06/2023   Medicare Annual Wellness Visit  12/09/2023   DTaP/Tdap/Td vaccine (3 - Td or Tdap) 03/05/2030   Pneumonia Vaccine  Completed   DEXA scan (bone density measurement)  Completed   Hepatitis C Screening  Completed   Zoster (Shingles) Vaccine  Completed   HPV Vaccine  Aged Out   Cologuard (Stool DNA test)  Discontinued    Advanced directives: yes  Conditions/risks identified: low fall risk  Next appointment: Follow up in one year for your annual wellness visit 12/15/2023 @11 :15 am telephone   Preventive Care 65 Years and Older, Female Preventive care refers to lifestyle choices and visits with your health care provider that can promote health and wellness. What does preventive care include? A yearly physical exam. This is also called an annual well check. Dental exams once or twice a year. Routine eye exams. Ask your health care provider how often you should have your eyes checked. Personal lifestyle choices, including: Daily care of your teeth and gums. Regular physical activity. Eating a healthy diet. Avoiding tobacco and drug use. Limiting alcohol use. Practicing safe  sex. Taking low-dose aspirin every day. Taking vitamin and mineral supplements as recommended by your health care provider. What happens during an annual well check? The services and screenings done by your health care provider during your annual well check will depend on your age, overall health, lifestyle risk factors, and family history of disease. Counseling  Your health care provider may ask you questions about your: Alcohol use. Tobacco use. Drug use. Emotional well-being. Home and relationship well-being. Sexual activity. Eating habits. History of falls. Memory and ability to understand (cognition). Work and work Astronomer. Reproductive health. Screening  You may have the following tests or measurements: Height, weight, and BMI. Blood pressure. Lipid and cholesterol levels. These may be checked every 5 years, or more frequently if you are over 76 years old. Skin check. Lung cancer screening. You may have this screening every year starting at age 39 if you have a 30-pack-year history of smoking and currently smoke or have quit within the past 15 years. Fecal occult blood test (FOBT) of the stool. You may have this test every year starting at age 87. Flexible sigmoidoscopy or colonoscopy. You may have a sigmoidoscopy every 5 years or a colonoscopy every 10 years starting at age 73. Hepatitis C blood test. Hepatitis B blood test. Sexually transmitted disease (STD) testing. Diabetes screening. This is done by checking your blood sugar (glucose) after you have not eaten for a while (  fasting). You may have this done every 1-3 years. Bone density scan. This is done to screen for osteoporosis. You may have this done starting at age 75. Mammogram. This may be done every 1-2 years. Talk to your health care provider about how often you should have regular mammograms. Talk with your health care provider about your test results, treatment options, and if necessary, the need for more  tests. Vaccines  Your health care provider may recommend certain vaccines, such as: Influenza vaccine. This is recommended every year. Tetanus, diphtheria, and acellular pertussis (Tdap, Td) vaccine. You may need a Td booster every 10 years. Zoster vaccine. You may need this after age 58. Pneumococcal 13-valent conjugate (PCV13) vaccine. One dose is recommended after age 38. Pneumococcal polysaccharide (PPSV23) vaccine. One dose is recommended after age 35. Talk to your health care provider about which screenings and vaccines you need and how often you need them. This information is not intended to replace advice given to you by your health care provider. Make sure you discuss any questions you have with your health care provider. Document Released: 06/06/2015 Document Revised: 01/28/2016 Document Reviewed: 03/11/2015 Elsevier Interactive Patient Education  2017 [employer].  Fall Prevention in the Home Falls can cause injuries. They can happen to people of all ages. There are many things you can do to make your home safe and to help prevent falls. What can I do on the outside of my home? Regularly fix the edges of walkways and driveways and fix any cracks. Remove anything that might make you trip as you walk through a door, such as a raised step or threshold. Trim any bushes or trees on the path to your home. Use bright outdoor lighting. Clear any walking paths of anything that might make someone trip, such as rocks or tools. Regularly check to see if handrails are loose or broken. Make sure that both sides of any steps have handrails. Any raised decks and porches should have guardrails on the edges. Have any leaves, snow, or ice cleared regularly. Use sand or salt on walking paths during winter. Clean up any spills in your garage right away. This includes oil or grease spills. What can I do in the bathroom? Use night lights. Install grab bars by the toilet and in the tub and shower.  Do not use towel bars as grab bars. Use non-skid mats or decals in the tub or shower. If you need to sit down in the shower, use a plastic, non-slip stool. Keep the floor dry. Clean up any water that spills on the floor as soon as it happens. Remove soap buildup in the tub or shower regularly. Attach bath mats securely with double-sided non-slip rug tape. Do not have throw rugs and other things on the floor that can make you trip. What can I do in the bedroom? Use night lights. Make sure that you have a light by your bed that is easy to reach. Do not use any sheets or blankets that are too big for your bed. They should not hang down onto the floor. Have a firm chair that has side arms. You can use this for support while you get dressed. Do not have throw rugs and other things on the floor that can make you trip. What can I do in the kitchen? Clean up any spills right away. Avoid walking on wet floors. Keep items that you use a lot in easy-to-reach places. If you need to reach something above you, use  a strong step stool that has a grab bar. Keep electrical cords out of the way. Do not use floor polish or wax that makes floors slippery. If you must use wax, use non-skid floor wax. Do not have throw rugs and other things on the floor that can make you trip. What can I do with my stairs? Do not leave any items on the stairs. Make sure that there are handrails on both sides of the stairs and use them. Fix handrails that are broken or loose. Make sure that handrails are as long as the stairways. Check any carpeting to make sure that it is firmly attached to the stairs. Fix any carpet that is loose or worn. Avoid having throw rugs at the top or bottom of the stairs. If you do have throw rugs, attach them to the floor with carpet tape. Make sure that you have a light switch at the top of the stairs and the bottom of the stairs. If you do not have them, ask someone to add them for you. What else  can I do to help prevent falls? Wear shoes that: Do not have high heels. Have rubber bottoms. Are comfortable and fit you well. Are closed at the toe. Do not wear sandals. If you use a stepladder: Make sure that it is fully opened. Do not climb a closed stepladder. Make sure that both sides of the stepladder are locked into place. Ask someone to hold it for you, if possible. Clearly mark and make sure that you can see: Any grab bars or handrails. First and last steps. Where the edge of each step is. Use tools that help you move around (mobility aids) if they are needed. These include: Canes. Walkers. Scooters. Crutches. Turn on the lights when you go into a dark area. Replace any light bulbs as soon as they burn out. Set up your furniture so you have a clear path. Avoid moving your furniture around. If any of your floors are uneven, fix them. If there are any pets around you, be aware of where they are. Review your medicines with your doctor. Some medicines can make you feel dizzy. This can increase your chance of falling. Ask your doctor what other things that you can do to help prevent falls. This information is not intended to replace advice given to you by your health care provider. Make sure you discuss any questions you have with your health care provider. Document Released: 03/06/2009 Document Revised: 10/16/2015 Document Reviewed: 06/14/2014 Elsevier Interactive Patient Education  2017 [employer].

## 2022-12-09 NOTE — Progress Notes (Signed)
Subjective:   Adrienne Anderson is a 82 y.o. female who presents for Medicare Annual (Subsequent) preventive examination.  Visit Complete: Virtual  I connected with  [name] on 12/09/22 by a audio enabled telemedicine application and verified that I am speaking with the correct person using two identifiers.  Patient Location: Home  Provider Location: Office/Clinic  I discussed the limitations of evaluation and management by telemedicine. The patient expressed understanding and agreed to proceed.  Patient Medicare AWV questionnaire was completed by the patient on (not done); I have confirmed that all information answered by patient is correct and no changes since this date.  Review of Systems        Objective:    Today's Vitals   12/09/22 1155  Weight: 175 lb (79.4 kg)  Height: 5\' 8"  (1.727 m)  PainSc: 2    Body mass index is 26.61 kg/m.     12/09/2022   12:08 PM 05/28/2021    3:16 PM 12/30/2020   11:00 AM 08/12/2020    6:37 AM 07/29/2020    6:41 AM 12/27/2019   10:55 AM 12/22/2018   11:06 AM  Advanced Directives  Does Patient Have a Medical Advance Directive? Yes No Yes Yes Yes Yes Yes  Type of Estate agent of [place];Living will  Healthcare Power of [place];Living will Healthcare Power of [place];Living will Healthcare Power of [place];Living will Healthcare Power of [place];Living will Healthcare Power of [place];Living will  Does patient want to make changes to medical advance directive?    No - Guardian declined No - Guardian declined  No - Patient declined  Copy of Healthcare Power of Attorney in Chart?   No - copy requested No - copy requested No - copy requested No - copy requested No - copy requested    Current Medications (verified) Outpatient Encounter Medications as of 12/09/2022  Medication Sig   brimonidine (ALPHAGAN) 0.15 % ophthalmic solution Place 1 drop into both eyes 2 (two) times daily. x30 days   Empagliflozin-metFORMIN HCl  (SYNJARDY) 12.5-500 MG TABS TAKE 1 TABLET TWICE DAILY   glucose blood (ONE TOUCH ULTRA TEST) test strip Use as instructed   latanoprost (XALATAN) 0.005 % ophthalmic solution Apply to eye.   MULTIPLE VITAMIN PO Take by mouth.   rosuvastatin (CRESTOR) 40 MG tablet TAKE 1 TABLET (40 MG TOTAL) BY MOUTH DAILY.   telmisartan (MICARDIS) 20 MG tablet TAKE 1 TABLET EVERY DAY   VITAMIN D PO Take 1,000 Units by mouth daily.   No facility-administered encounter medications on file as of 12/09/2022.    Allergies (verified) Clindamycin/lincomycin   History: Past Medical History:  Diagnosis Date   Cataract    Diabetes mellitus without complication (HCC)    Glaucoma    Hyperlipidemia    Hypertension    Obesity    Wears dentures    full upper   Past Surgical History:  Procedure Laterality Date   BREAST BIOPSY Bilateral    rt/clip-02/27/03, left - all neg   BREAST EXCISIONAL BIOPSY Right 1982   CATARACT EXTRACTION W/PHACO Right 07/29/2020   Procedure: CATARACT EXTRACTION PHACO AND INTRAOCULAR LENS PLACEMENT (IOC) RIGHT DIABETIC 4.08 00:33.1;  Surgeon: Galen Manila, MD;  Location: [facility];  Service: Ophthalmology;  Laterality: Right;  Diabetic - oral meds   CATARACT EXTRACTION W/PHACO Left 08/12/2020   Procedure: CATARACT EXTRACTION PHACO AND INTRAOCULAR LENS PLACEMENT (IOC) LEFT DIABETIC 5.32 00:32.2;  Surgeon: Galen Manila, MD;  Location[facility];  Service: Ophthalmology;  Laterality: Left;  Diabetic - oral meds   FINE NEEDLE ASPIRATION Left 05/20/2020   Dr. Gershon Crane thyroid -   VAGINAL HYSTERECTOMY  1986   Family History  Problem Relation Age of Onset   Depression Mother    Rheum arthritis Father    Hypertension Sister    Breast cancer Sister    Alcohol abuse Brother    Hypertension Sister    Heart disease Sister    Cancer Sister        lung   Hypertension Sister    Kidney disease Sister    COPD Sister    Heart failure Sister    Breast cancer  Cousin        pat cousin   Hypertension Sister    Breast cancer Paternal Aunt    Social History   Socioeconomic History   Marital status: Widowed    Spouse name: Jerolyn Shin   Number of children: 2   Years of education: Not on file   Highest education level: Bachelor's degree (e.g., BA, AB, BS)  Occupational History   Occupation: Retired  Tobacco Use   Smoking status: Never   Smokeless tobacco: Never   Tobacco comments:    smoking cessation materials not required  Vaping Use   Vaping status: Never Used  Substance and Sexual Activity   Alcohol use: No    Alcohol/week: 0.0 standard drinks of alcohol   Drug use: No   Sexual activity: Not Currently    Partners: Male  Other Topics Concern   Not on file  Social History Narrative   Widow since 2020, two children in [place]    Social Determinants of Health   Financial Resource Strain: Low Risk  (12/09/2022)   Overall Financial Resource Strain (CARDIA)    Difficulty of Paying Living Expenses: Not hard at all  Food Insecurity: No Food Insecurity (12/09/2022)   Hunger Vital Sign    Worried About Running Out of Food in the Last Year: Never true    Ran Out of Food in the Last Year: Never true  Transportation Needs: No Transportation Needs (12/09/2022)   PRAPARE - Administrator, Civil Service (Medical): No    Lack of Transportation (Non-Medical): No  Physical Activity: Inactive (12/09/2022)   Exercise Vital Sign    Days of Exercise per Week: 0 days    Minutes of Exercise per Session: 0 min  Stress: No Stress Concern Present (12/09/2022)   [employer] of Occupational Health - Occupational Stress Questionnaire    Feeling of Stress : Not at all  Social Connections: Moderately Integrated (12/09/2022)   Social Connection and Isolation Panel [NHANES]    Frequency of Communication with Friends and Family: Three times a week    Frequency of Social Gatherings with Friends and Family: Three times a week    Attends  Religious Services: More than 4 times per year    Active Member of Clubs or Organizations: Yes    Attends Banker Meetings: More than 4 times per year    Marital Status: Widowed    Tobacco Counseling Counseling given: Not Answered Tobacco comments: smoking cessation materials not required   Clinical Intake:  Pre-visit preparation completed: Yes  Pain : 0-10 Pain Score: 2  Pain Type: Chronic pain Pain Location: Back Pain Descriptors / Indicators: Aching Pain Onset: More than a month ago Pain Frequency: Intermittent     BMI - recorded: 26.61 Nutritional Status: BMI 25 -29 Overweight Nutritional Risks: None Diabetes: Yes CBG  done?: No Did pt. bring in CBG monitor from home?: No  How often do you need to have someone help you when you read instructions, pamphlets, or other written materials from your doctor or pharmacy?: 1 - Never  Interpreter Needed?: No  Comments: sister lives with pt Information entered by :: B.Lenor Provencher,LPN   Activities of Daily Living    12/09/2022   12:08 PM 08/12/2022    8:48 AM  In your present state of health, do you have any difficulty performing the following activities:  Hearing? 0 0  Vision? 0 0  Difficulty concentrating or making decisions? 0 0  Walking or climbing stairs? 0 0  Dressing or bathing? 0 0  Doing errands, shopping? 0 0  Preparing Food and eating ? N   Using the Toilet? N   In the past six months, have you accidently leaked urine? N   Do you have problems with loss of bowel control? N   Managing your Medications? N   Managing your Finances? N   Housekeeping or managing your Housekeeping? N     Patient Care Team: Alba Cory, MD as PCP - General (Family Medicine) Galen Manila, MD as Consulting Physician (Ophthalmology)  Indicate any recent Medical Services you may have received from other than [facility] providers in the past year (date may be approximate).     Assessment:   This is a routine  wellness examination for [place].  Hearing/Vision screen Hearing Screening - Comments:: Adequate hearing Vision Screening - Comments:: Adequate vision;has glaucoma-takes eye drops;readers [facility]  Dietary issues and exercise activities discussed:     Goals Addressed             This Visit's Progress    DIET - INCREASE WATER INTAKE   On track    Recommend to drink at least 6-8 8oz glasses of water per day.       Depression Screen    12/09/2022   12:06 PM 08/12/2022    8:48 AM 06/14/2022    9:00 AM 04/12/2022    8:03 AM 12/31/2021   10:37 AM 12/08/2021    8:05 AM 06/09/2021    8:27 AM  PHQ 2/9 Scores  PHQ - 2 Score 0 0 0 0 0 0 0  PHQ- 9 Score  0 0 0 0 0 0    Fall Risk    12/09/2022   12:00 PM 08/12/2022    8:47 AM 06/14/2022    9:00 AM 04/12/2022    8:03 AM 12/31/2021   10:35 AM  Fall Risk   Falls in the past year? 0 0 0 0 0  Number falls in past yr: 0 0  0   Injury with Fall? 0 0  0   Risk for fall due to : No Fall Risks No Fall Risks No Fall Risks No Fall Risks No Fall Risks  Follow up Education provided;Falls prevention discussed Falls prevention discussed Falls prevention discussed;Education provided Falls prevention discussed Education provided;Falls evaluation completed    MEDICARE RISK AT HOME:  Medicare Risk at Home - 12/09/22 1202     Any stairs in or around the home? Yes    If so, are there any without handrails? Yes    Home free of loose throw rugs in walkways, pet beds, electrical cords, etc? Yes    Adequate lighting in your home to reduce risk of falls? Yes    Life alert? No    Use of a cane, walker or w/c? No  Grab bars in the bathroom? Yes    Shower chair or bench in shower? Yes    Elevated toilet seat or a handicapped toilet? Yes             TIMED UP AND GO:  Was the test performed?  No    Cognitive Function:        12/09/2022   12:09 PM 12/31/2021   10:35 AM 12/27/2019   10:57 AM 12/22/2018   11:09 AM 12/20/2017     8:43 AM  6CIT Screen  What Year? 0 points 0 points 0 points 0 points 0 points  What month? 0 points 0 points 0 points 0 points 0 points  What time? 0 points 0 points 0 points 0 points 0 points  Count back from 20 0 points 0 points 0 points 0 points 0 points  Months in reverse 0 points 0 points 0 points 0 points 0 points  Repeat phrase 0 points 0 points 0 points 0 points 2 points  Total Score 0 points 0 points 0 points 0 points 2 points    Immunizations Immunization History  Administered Date(s) Administered   Fluad Quad(high Dose 65+) 01/24/2019, 02/24/2021   Influenza Split 03/25/2014   Influenza, High Dose Seasonal PF 03/10/2016, 03/28/2017, 02/02/2018, 03/07/2020   Influenza, Seasonal, Injecte, Preservative Fre 05/03/2012   Influenza,inj,Quad PF,6+ Mos 03/06/2015   Influenza-Unspecified 05/03/2012, 03/20/2022   Moderna SARS-COV2 Booster Vaccination 03/20/2022   Moderna Sars-Covid-2 Vaccination 06/29/2019, 07/27/2019, 03/19/2020, 09/10/2020   Pneumococcal Conjugate-13 10/30/2014   Pneumococcal Polysaccharide-23 05/03/2012   Respiratory Syncytial Virus Vaccine,Recomb Aduvanted(Arexvy) 03/20/2022   Tdap 08/03/2012, 03/05/2020   Zoster Recombinant(Shingrix) 02/02/2018, 04/04/2018   Zoster, Live 11/06/2012    TDAP status: Up to date  Flu Vaccine status: Up to date  Pneumococcal vaccine status: Up to date  Covid-19 vaccine status: Completed vaccines  Qualifies for Shingles Vaccine? Yes   Zostavax completed Yes   Shingrix Completed?: Yes  Screening Tests Health Maintenance  Topic Date Due   COVID-19 Vaccine (5 - 2023-24 season) 05/15/2022   INFLUENZA VACCINE  12/23/2022   HEMOGLOBIN A1C  02/12/2023   OPHTHALMOLOGY EXAM  04/03/2023   Diabetic kidney evaluation - eGFR measurement  06/15/2023   Diabetic kidney evaluation - Urine ACR  06/15/2023   FOOT EXAM  06/15/2023   MAMMOGRAM  12/06/2023   Medicare Annual Wellness (AWV)  12/09/2023   DTaP/Tdap/Td (3 - Td or Tdap)  03/05/2030   Pneumonia Vaccine 4+ Years old  Completed   DEXA SCAN  Completed   Hepatitis C Screening  Completed   Zoster Vaccines- Shingrix  Completed   HPV VACCINES  Aged Out   Fecal DNA (Cologuard)  Discontinued    Health Maintenance  Health Maintenance Due  Topic Date Due   COVID-19 Vaccine (5 - 2023-24 season) 05/15/2022    Colorectal cancer screening: No longer required.   Mammogram status: No longer required due to age.  Bone Density status: Completed yes. Results reflect: Bone density results: OSTEOPENIA. Repeat every 3 years.  Lung Cancer Screening: (Low Dose CT Chest recommended if Age 40-80 years, 20 pack-year currently smoking OR have quit w/in 15years.) does not qualify.   Lung Cancer Screening Referral: no  Additional Screening:  Hepatitis C Screening: does not qualify; Completed yes  Vision Screening: Recommended annual ophthalmology exams for early detection of glaucoma and other disorders of the eye. Is the patient up to date with their annual eye exam?  Yes  Who is the provider or what  is the name of the office in which the patient attends annual eye exams? Dr Melanie Crazier If pt is not established with a provider, would they like to be referred to a provider to establish care? No .   Dental Screening: Recommended annual dental exams for proper oral hygiene  Diabetic Foot Exam: Diabetic Foot Exam: Completed yes  Community Resource Referral / Chronic Care Management: CRR required this visit?  No   CCM required this visit?  No     Plan:     I have personally reviewed and noted the following in the patient's chart:   Medical and social history Use of alcohol, tobacco or illicit drugs  Current medications and supplements including opioid prescriptions. Patient is not currently taking opioid prescriptions. Functional ability and status Nutritional status Physical activity Advanced directives List of other physicians Hospitalizations, surgeries, and  ER visits in previous 12 months Vitals Screenings to include cognitive, depression, and falls Referrals and appointments  In addition, I have reviewed and discussed with patient certain preventive protocols, quality metrics, and best practice recommendations. A written personalized care plan for preventive services as well as general preventive health recommendations were provided to patient.     Sue Lush, LPN   [id number]   After Visit Summary: (MyChart) Due to this being a telephonic visit, the after visit summary with patients personalized plan was offered to patient via MyChart   Nurse Notes: The patient states she is doing well and questions at this time. She does mention she has been having a little more swelling in her ankles and will talk with PCP about it in visit next week.
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
## 2022-12-13 ENCOUNTER — Ambulatory Visit: Admission: RE | Admit: 2022-12-13 | Payer: Medicare PPO | Source: Ambulatory Visit | Admitting: *Deleted

## 2022-12-13 ENCOUNTER — Ambulatory Visit
Admission: RE | Admit: 2022-12-13 | Discharge: 2022-12-13 | Disposition: A | Discharge status: Home / Self Care | Payer: Medicare PPO | Source: Ambulatory Visit | Attending: Family Medicine | Admitting: Family Medicine

## 2022-12-13 ENCOUNTER — Ambulatory Visit: Payer: Medicare PPO | Admitting: Family Medicine

## 2022-12-13 ENCOUNTER — Encounter: Payer: Self-pay | Admitting: Family Medicine

## 2022-12-13 VITALS — BP 114/68 | HR 89 | Temp 97.9°F | Resp 14 | Ht 69.0 in | Wt 174.6 lb

## 2022-12-13 DIAGNOSIS — I10 Essential (primary) hypertension: Secondary | ICD-10-CM

## 2022-12-13 DIAGNOSIS — E1159 Type 2 diabetes mellitus with other circulatory complications: Secondary | ICD-10-CM

## 2022-12-13 DIAGNOSIS — E785 Hyperlipidemia, unspecified: Secondary | ICD-10-CM

## 2022-12-13 DIAGNOSIS — E1169 Type 2 diabetes mellitus with other specified complication: Secondary | ICD-10-CM | POA: Diagnosis not present

## 2022-12-13 DIAGNOSIS — M5136 Other intervertebral disc degeneration, lumbar region: Secondary | ICD-10-CM | POA: Diagnosis not present

## 2022-12-13 DIAGNOSIS — G8929 Other chronic pain: Secondary | ICD-10-CM | POA: Insufficient documentation

## 2022-12-13 DIAGNOSIS — D708 Other neutropenia: Secondary | ICD-10-CM

## 2022-12-13 DIAGNOSIS — G2581 Restless legs syndrome: Secondary | ICD-10-CM

## 2022-12-13 DIAGNOSIS — E042 Nontoxic multinodular goiter: Secondary | ICD-10-CM

## 2022-12-13 DIAGNOSIS — M545 Low back pain, unspecified: Secondary | ICD-10-CM | POA: Insufficient documentation

## 2022-12-13 DIAGNOSIS — I152 Hypertension secondary to endocrine disorders: Secondary | ICD-10-CM

## 2022-12-13 DIAGNOSIS — M48061 Spinal stenosis, lumbar region without neurogenic claudication: Secondary | ICD-10-CM | POA: Diagnosis not present

## 2022-12-13 DIAGNOSIS — M8588 Other specified disorders of bone density and structure, other site: Secondary | ICD-10-CM | POA: Diagnosis not present

## 2022-12-13 DIAGNOSIS — M47816 Spondylosis without myelopathy or radiculopathy, lumbar region: Secondary | ICD-10-CM | POA: Diagnosis not present

## 2022-12-13 DIAGNOSIS — M4807 Spinal stenosis, lumbosacral region: Secondary | ICD-10-CM | POA: Diagnosis not present

## 2022-12-13 LAB — POCT GLYCOSYLATED HEMOGLOBIN (HGB A1C): Hemoglobin A1C: 6.1 % — AB (ref 4.0–5.6)

## 2022-12-28 ENCOUNTER — Ambulatory Visit: Payer: Medicare PPO

## 2022-12-28 VITALS — BP 114/70

## 2022-12-28 DIAGNOSIS — E1159 Type 2 diabetes mellitus with other circulatory complications: Secondary | ICD-10-CM

## 2022-12-28 NOTE — Care Management (Signed)
Patient here for Blood pressure check per last note:  HTN: she is now off hctz and only taking micardis 40 mg and bp is still towards low end of normal, we decreased dose to  20 mg, however she noticed increase in swelling of her ankles and is taking 40 mg dose again, explained ARB's are not diuretics and she will try going down on the dose again   Blood pressure today is 114/70 currently taking micardis 20mg   Please advise if any changes

## 2023-01-02 ENCOUNTER — Ambulatory Visit (INDEPENDENT_AMBULATORY_CARE_PROVIDER_SITE_OTHER): Payer: Medicare PPO

## 2023-01-02 ENCOUNTER — Ambulatory Visit
Admission: EM | Admit: 2023-01-02 | Discharge: 2023-01-02 | Disposition: A | Discharge status: Home / Self Care | Payer: Medicare PPO | Attending: Emergency Medicine | Admitting: Emergency Medicine

## 2023-01-02 DIAGNOSIS — J180 Bronchopneumonia, unspecified organism: Secondary | ICD-10-CM

## 2023-01-02 DIAGNOSIS — R058 Other specified cough: Secondary | ICD-10-CM | POA: Diagnosis not present

## 2023-01-02 MED ORDER — BENZONATATE 100 MG PO CAPS: 200.0000 mg | ORAL_CAPSULE | Freq: Three times a day (TID) | ORAL | 0 refills | Status: DC

## 2023-01-02 MED ORDER — PROMETHAZINE-DM 6.25-15 MG/5ML PO SYRP: 5.0000 mL | ORAL_SOLUTION | Freq: Four times a day (QID) | ORAL | 0 refills | Status: DC | PRN

## 2023-01-02 MED ORDER — DOXYCYCLINE HYCLATE 100 MG PO CAPS: 100.0000 mg | ORAL_CAPSULE | Freq: Two times a day (BID) | ORAL | 0 refills | Status: AC

## 2023-01-02 NOTE — ED Triage Notes (Signed)
Pt c/o cough & congestion x2 wks. Denies any fevers. Has tried alka seltzer w/o relief.

## 2023-01-02 NOTE — Discharge Instructions (Addendum)
You are being diagnosed with pneumonia.  Your chest x-ray did show some patchy haziness around her left hilum, this is where your trachea separates and goes into each lung.  Please take the doxycycline 100 mg twice daily with food for 7 days for treatment of your bronchopneumonia.  Use the Tessalon Perles every 8 hours during the day as needed for cough.  Take them with a small sip of water.  You may experience numbness at the base of your tongue or metallic taste in your mouth, this is normal.  Use the Promethazine DM cough syrup at bedtime as needed for cough and congestion.  This medication will make you drowsy so make sure that you are mindful of that if you have to get midline to use the bathroom.  If you develop any worsening symptoms such as fever or shortness of breath either return for reevaluation, see your PCP, or seek care in the ER.

## 2023-01-02 NOTE — ED Provider Notes (Signed)
[facility]    CSN: [id number] Arrival date & time: 01/02/23  1132      History   Chief Complaint Chief Complaint  Patient presents with   Cough   Congestion    HPI Adrienne Anderson is a 82 y.o. female.   HPI  82 year old female past medical history significant for hypertension, hyperlipidemia, diabetes, and glaucoma presents for evaluation of 2 weeks worth of cough and chest congestion.  She denies any upper respiratory symptoms.  She reports that her cough is producing sputum that is a mixture of both green and clear.  She has not measured a fever and she denies shortness breath or wheezing.  No runny nose or nasal congestion.  Past Medical History:  Diagnosis Date   Cataract    Diabetes mellitus without complication (HCC)    Glaucoma    Hyperlipidemia    Hypertension    Obesity    Wears dentures    full upper    Patient Active Problem List   Diagnosis Date Noted   Other neutropenia (HCC) 12/08/2021   Back pain with radiculopathy 12/08/2021   Osteopenia of lumbar spine 12/08/2021   Benign hypertension 12/08/2021   Multinodular goiter 12/08/2021   Glaucoma 08/24/2014   Cataract 08/24/2014   Dyslipidemia associated with type 2 diabetes mellitus (HCC) 08/24/2014   Hypertension associated with diabetes (HCC) 08/24/2014   Dyslipidemia 08/24/2014    Past Surgical History:  Procedure Laterality Date   BREAST BIOPSY Bilateral    rt/clip-02/27/03, left - all neg   BREAST EXCISIONAL BIOPSY Right 1982   CATARACT EXTRACTION W/PHACO Right 07/29/2020   Procedure: CATARACT EXTRACTION PHACO AND INTRAOCULAR LENS PLACEMENT (IOC) RIGHT DIABETIC 4.08 00:33.1;  Surgeon: Galen Manila, MD;  Location: [facility];  Service: Ophthalmology;  Laterality: Right;  Diabetic - oral meds   CATARACT EXTRACTION W/PHACO Left 08/12/2020   Procedure: CATARACT EXTRACTION PHACO AND INTRAOCULAR LENS PLACEMENT (IOC) LEFT DIABETIC 5.32 00:32.2;  Surgeon: Galen Manila,  MD;  Location: [facility];  Service: Ophthalmology;  Laterality: Left;  Diabetic - oral meds   FINE NEEDLE ASPIRATION Left 05/20/2020   Dr. Gershon Crane thyroid -   VAGINAL HYSTERECTOMY  1986    OB History   No obstetric history on file.      Home Medications    Prior to Admission medications   Medication Sig Start Date End Date Taking? Authorizing Provider  benzonatate (TESSALON) 100 MG capsule Take 2 capsules (200 mg total) by mouth every 8 (eight) hours. 01/02/23  Yes Becky Augusta, NP  brimonidine (ALPHAGAN) 0.15 % ophthalmic solution Place 1 drop into both eyes 2 (two) times daily. x30 days 11/21/17  Yes [provider]  doxycycline (VIBRAMYCIN) 100 MG capsule Take 1 capsule (100 mg total) by mouth 2 (two) times daily for 7 days. 01/02/23 01/09/23 Yes Becky Augusta, NP  Empagliflozin-metFORMIN HCl [facility]) 12.5-500 MG TABS TAKE 1 TABLET TWICE DAILY 09/28/22  Yes Sowles, Danna Hefty, MD  glucose blood (ONE TOUCH ULTRA TEST) test strip Use as instructed 11/02/16  Yes Doren Custard, FNP  latanoprost (XALATAN) 0.005 % ophthalmic solution Apply to eye.   Yes [provider]  MULTIPLE VITAMIN PO Take by mouth.   Yes [provider]  promethazine-dextromethorphan (PROMETHAZINE-DM) 6.25-15 MG/5ML syrup Take 5 mLs by mouth 4 (four) times daily as needed. 01/02/23  Yes Becky Augusta, NP  rosuvastatin (CRESTOR) 40 MG tablet TAKE 1 TABLET (40 MG TOTAL) BY MOUTH DAILY. 05/09/22  Yes Alba Cory, MD  telmisartan (MICARDIS) 20 MG tablet TAKE 1 TABLET EVERY DAY 11/01/22  Yes Sowles, Danna Hefty, MD  VITAMIN D PO Take 1,000 Units by mouth daily.   Yes [provider]    Family History Family History  Problem Relation Age of Onset   Depression Mother    Rheum arthritis Father    Hypertension Sister    Breast cancer Sister    Alcohol abuse Brother    Hypertension Sister    Heart disease Sister    Cancer Sister        lung   Hypertension Sister    Kidney disease  Sister    COPD Sister    Heart failure Sister    Breast cancer Cousin        pat cousin   Hypertension Sister    Breast cancer Paternal Aunt     Social History Social History   Tobacco Use   Smoking status: Never   Smokeless tobacco: Never   Tobacco comments:    smoking cessation materials not required  Vaping Use   Vaping status: Never Used  Substance Use Topics   Alcohol use: No    Alcohol/week: 0.0 standard drinks of alcohol   Drug use: No     Allergies   Clindamycin/lincomycin   Review of Systems Review of Systems  Constitutional:  Negative for fever.  HENT:  Negative for congestion and rhinorrhea.   Respiratory:  Positive for cough. Negative for shortness of breath and wheezing.      Physical Exam Triage Vital Signs ED Triage Vitals  Encounter Vitals Group     BP 01/02/23 1136 130/78     Systolic BP Percentile --      Diastolic BP Percentile --      Pulse Rate 01/02/23 1136 74     Resp 01/02/23 1136 16     Temp 01/02/23 1136 98.5 F (36.9 C)     Temp Source 01/02/23 1136 Oral     SpO2 01/02/23 1136 96 %     Weight 01/02/23 1135 174 lb 9.6 oz (79.2 kg)     Height 01/02/23 1135 5\' 9"  (1.753 m)     Head Circumference --      Peak Flow --      Pain Score 01/02/23 1138 0     Pain Loc --      Pain Education --      Exclude from Growth Chart --    No data found.  Updated Vital Signs BP 130/78 (BP Location: Right Arm)   Pulse 74   Temp 98.5 F (36.9 C) (Oral)   Resp 16   Ht 5\' 9"  (1.753 m)   Wt 174 lb 9.6 oz (79.2 kg)   SpO2 96%   BMI 25.78 kg/m   Visual Acuity Right Eye Distance:   Left Eye Distance:   Bilateral Distance:    Right Eye Near:   Left Eye Near:    Bilateral Near:     Physical Exam Vitals and nursing note reviewed.  Constitutional:      Appearance: Normal appearance. She is not ill-appearing.  HENT:     Head: Normocephalic and atraumatic.  Cardiovascular:     Rate and Rhythm: Normal rate and regular rhythm.      Pulses: Normal pulses.     Heart sounds: Normal heart sounds. No murmur heard.    No friction rub. No gallop.  Pulmonary:     Effort: Pulmonary effort is normal.     Breath sounds:  Normal breath sounds. No wheezing, rhonchi or rales.  Skin:    General: Skin is warm and dry.     Capillary Refill: Capillary refill takes less than 2 seconds.     Findings: No erythema or rash.  Neurological:     Mental Status: She is alert.      UC Treatments / Results  Labs (all labs ordered are listed, but only abnormal results are displayed) Labs Reviewed - No data to display  EKG   Radiology DG Chest 2 View  Result Date: 01/02/2023 CLINICAL DATA:  Productive cough x 2 weeks. EXAM: CHEST - 2 VIEW COMPARISON:  08/24/2017 chest radiograph. FINDINGS: Stable cardiomediastinal silhouette with normal heart size. No pneumothorax. No pleural effusion. Lungs appear clear, with no acute consolidative airspace disease and no pulmonary edema. IMPRESSION: No active cardiopulmonary disease. Electronically Signed   By: Delbert Phenix M.D.   On: 01/02/2023 12:31    Procedures Procedures (including critical care time)  Medications Ordered in UC Medications - No data to display  Initial Impression / Assessment and Plan / UC Course  I have reviewed the triage vital signs and the nursing notes.  Pertinent labs & imaging results that were available during my care of the patient were reviewed by me and considered in my medical decision making (see chart for details).   Patient is a pleasant, nontoxic-appearing 82 year old female presenting for evaluation to [place] with respiratory complaints outlined HPI above.  She is not in any respiratory distress and she can speak in full sentence without dyspnea or tachypnea.  Her respiratory rate.  She 16 with a room air oxygen saturation 96%.  Lung sounds are clear to auscultation all fields.  Given that she has had 2 weeks of symptoms and she is complaining of Sputum production I  will obtain a chest x-ray to rule out the presence of pneumonia.  Chest x-ray independently reviewed and evaluated by me.  Impression: There is some patchy haziness in the left perihilar region.  Radiology overread is pending. Radiology impression states there is no active cardiopulmonary disease.  I will discharge patient on the diagnosis of bronchopneumonia and started on doxycycline 100 mg twice daily for 10 days along with Tessalon Perles and Promethazine DM cough syrup.   Final Clinical Impressions(s) / UC Diagnoses   Final diagnoses:  Bronchopneumonia     Discharge Instructions      You are being diagnosed with pneumonia.  Your chest x-ray did show some patchy haziness around her left hilum, this is where your trachea separates and goes into each lung.  Please take the doxycycline 100 mg twice daily with food for 7 days for treatment of your bronchopneumonia.  Use the Tessalon Perles every 8 hours during the day as needed for cough.  Take them with a small sip of water.  You may experience numbness at the base of your tongue or metallic taste in your mouth, this is normal.  Use the Promethazine DM cough syrup at bedtime as needed for cough and congestion.  This medication will make you drowsy so make sure that you are mindful of that if you have to get midline to use the bathroom.  If you develop any worsening symptoms such as fever or shortness of breath either return for reevaluation, see your PCP, or seek care in the ER.     ED Prescriptions     Medication Sig Dispense Auth. Provider   doxycycline (VIBRAMYCIN) 100 MG capsule Take 1 capsule (100 mg  total) by mouth 2 (two) times daily for 7 days. 14 capsule Becky Augusta, NP   benzonatate (TESSALON) 100 MG capsule Take 2 capsules (200 mg total) by mouth every 8 (eight) hours. 21 capsule Becky Augusta, NP   promethazine-dextromethorphan (PROMETHAZINE-DM) 6.25-15 MG/5ML syrup Take 5 mLs by mouth 4 (four) times daily as needed.  118 mL Becky Augusta, NP      PDMP not reviewed this encounter.   Becky Augusta, NP 01/02/23 1241

## 2023-02-04 ENCOUNTER — Other Ambulatory Visit: Payer: Self-pay | Admitting: Family Medicine

## 2023-02-04 DIAGNOSIS — E1159 Type 2 diabetes mellitus with other circulatory complications: Secondary | ICD-10-CM

## 2023-02-26 ENCOUNTER — Other Ambulatory Visit: Payer: Self-pay | Admitting: Family Medicine

## 2023-02-26 DIAGNOSIS — E1169 Type 2 diabetes mellitus with other specified complication: Secondary | ICD-10-CM

## 2023-02-26 DIAGNOSIS — E785 Hyperlipidemia, unspecified: Secondary | ICD-10-CM

## 2023-03-23 ENCOUNTER — Other Ambulatory Visit: Payer: Self-pay | Admitting: Family Medicine

## 2023-03-23 DIAGNOSIS — E1159 Type 2 diabetes mellitus with other circulatory complications: Secondary | ICD-10-CM

## 2023-03-23 DIAGNOSIS — E1169 Type 2 diabetes mellitus with other specified complication: Secondary | ICD-10-CM

## 2023-03-23 DIAGNOSIS — I152 Hypertension secondary to endocrine disorders: Secondary | ICD-10-CM

## 2023-04-08 LAB — HM DIABETES EYE EXAM
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
## 2023-04-15 ENCOUNTER — Ambulatory Visit: Payer: Medicare PPO | Admitting: Family Medicine

## 2023-04-15 ENCOUNTER — Encounter: Payer: Self-pay | Admitting: Family Medicine

## 2023-04-15 VITALS — BP 112/70 | HR 84 | Temp 97.8°F | Resp 16 | Ht 69.0 in | Wt 175.1 lb

## 2023-04-15 DIAGNOSIS — E1169 Type 2 diabetes mellitus with other specified complication: Secondary | ICD-10-CM

## 2023-04-15 DIAGNOSIS — G2581 Restless legs syndrome: Secondary | ICD-10-CM

## 2023-04-15 DIAGNOSIS — M545 Low back pain, unspecified: Secondary | ICD-10-CM

## 2023-04-15 DIAGNOSIS — Z23 Encounter for immunization: Secondary | ICD-10-CM

## 2023-04-15 DIAGNOSIS — D708 Other neutropenia: Secondary | ICD-10-CM

## 2023-04-15 DIAGNOSIS — E1159 Type 2 diabetes mellitus with other circulatory complications: Secondary | ICD-10-CM | POA: Diagnosis not present

## 2023-04-15 DIAGNOSIS — E785 Hyperlipidemia, unspecified: Secondary | ICD-10-CM

## 2023-04-15 DIAGNOSIS — E042 Nontoxic multinodular goiter: Secondary | ICD-10-CM | POA: Diagnosis not present

## 2023-04-15 DIAGNOSIS — I152 Hypertension secondary to endocrine disorders: Secondary | ICD-10-CM

## 2023-04-15 DIAGNOSIS — M8588 Other specified disorders of bone density and structure, other site: Secondary | ICD-10-CM

## 2023-04-15 DIAGNOSIS — G8929 Other chronic pain: Secondary | ICD-10-CM

## 2023-04-15 LAB — POCT GLYCOSYLATED HEMOGLOBIN (HGB A1C): Hemoglobin A1C: 6.7 % — AB (ref 4.0–5.6)

## 2023-04-15 MED ORDER — TELMISARTAN 20 MG PO TABS
20.0000 mg | ORAL_TABLET | Freq: Every day | ORAL | 0 refills | Status: DC
Start: 2023-04-15 — End: 2023-07-18

## 2023-04-15 NOTE — Patient Instructions (Signed)
Stop Telmisartan on January 13th and return on the January 16 th for bp check only , I will see you in Feb

## 2023-05-07 DIAGNOSIS — G2581 Restless legs syndrome: Secondary | ICD-10-CM | POA: Diagnosis not present

## 2023-05-07 DIAGNOSIS — R32 Unspecified urinary incontinence: Secondary | ICD-10-CM | POA: Diagnosis not present

## 2023-05-07 DIAGNOSIS — E785 Hyperlipidemia, unspecified: Secondary | ICD-10-CM | POA: Diagnosis not present

## 2023-05-07 DIAGNOSIS — M5440 Lumbago with sciatica, unspecified side: Secondary | ICD-10-CM | POA: Diagnosis not present

## 2023-05-07 DIAGNOSIS — M8580 Other specified disorders of bone density and structure, unspecified site: Secondary | ICD-10-CM | POA: Diagnosis not present

## 2023-05-07 DIAGNOSIS — M1990 Unspecified osteoarthritis, unspecified site: Secondary | ICD-10-CM | POA: Diagnosis not present

## 2023-05-07 DIAGNOSIS — I129 Hypertensive chronic kidney disease with stage 1 through stage 4 chronic kidney disease, or unspecified chronic kidney disease: Secondary | ICD-10-CM | POA: Diagnosis not present

## 2023-05-07 DIAGNOSIS — N182 Chronic kidney disease, stage 2 (mild): Secondary | ICD-10-CM | POA: Diagnosis not present

## 2023-05-07 DIAGNOSIS — H409 Unspecified glaucoma: Secondary | ICD-10-CM | POA: Diagnosis not present

## 2023-05-24 DIAGNOSIS — H401132 Primary open-angle glaucoma, bilateral, moderate stage: Secondary | ICD-10-CM | POA: Diagnosis not present

## 2023-05-26 DIAGNOSIS — H401132 Primary open-angle glaucoma, bilateral, moderate stage: Secondary | ICD-10-CM | POA: Diagnosis not present

## 2023-06-09 ENCOUNTER — Ambulatory Visit: Payer: Medicare PPO

## 2023-06-09 VITALS — BP 128/72

## 2023-06-09 DIAGNOSIS — I10 Essential (primary) hypertension: Secondary | ICD-10-CM

## 2023-06-09 NOTE — Progress Notes (Signed)
Patient is in office today for a nurse visit for Blood Pressure Check. Patient blood pressure was 128/72, Patient No chest pain, No shortness of breath, No dyspnea on exertion

## 2023-06-27 DIAGNOSIS — H401132 Primary open-angle glaucoma, bilateral, moderate stage: Secondary | ICD-10-CM | POA: Diagnosis not present

## 2023-07-18 ENCOUNTER — Ambulatory Visit: Payer: Medicare PPO | Admitting: Family Medicine

## 2023-07-18 ENCOUNTER — Encounter: Payer: Self-pay | Admitting: Family Medicine

## 2023-07-18 VITALS — BP 130/80 | HR 91 | Temp 97.8°F | Resp 16 | Ht 69.0 in | Wt 178.1 lb

## 2023-07-18 DIAGNOSIS — D708 Other neutropenia: Secondary | ICD-10-CM

## 2023-07-18 DIAGNOSIS — G8929 Other chronic pain: Secondary | ICD-10-CM | POA: Diagnosis not present

## 2023-07-18 DIAGNOSIS — E042 Nontoxic multinodular goiter: Secondary | ICD-10-CM | POA: Diagnosis not present

## 2023-07-18 DIAGNOSIS — E1169 Type 2 diabetes mellitus with other specified complication: Secondary | ICD-10-CM | POA: Diagnosis not present

## 2023-07-18 DIAGNOSIS — Z7984 Long term (current) use of oral hypoglycemic drugs: Secondary | ICD-10-CM

## 2023-07-18 DIAGNOSIS — E785 Hyperlipidemia, unspecified: Secondary | ICD-10-CM

## 2023-07-18 DIAGNOSIS — G2581 Restless legs syndrome: Secondary | ICD-10-CM | POA: Diagnosis not present

## 2023-07-18 DIAGNOSIS — M8588 Other specified disorders of bone density and structure, other site: Secondary | ICD-10-CM

## 2023-07-18 DIAGNOSIS — M545 Low back pain, unspecified: Secondary | ICD-10-CM | POA: Diagnosis not present
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
## 2023-07-19 ENCOUNTER — Encounter: Payer: Self-pay | Admitting: Family Medicine

## 2023-07-19 LAB — MICROALBUMIN / CREATININE URINE RATIO
Creatinine, Urine: 58 mg/dL (ref 20–275)
Microalb Creat Ratio: 112 mg/g{creat} — ABNORMAL HIGH (ref <30)
Microalb, Ur: 6.5 mg/dL

## 2023-07-19 LAB — COMPLETE METABOLIC PANEL WITH GFR
AG Ratio: 1.5 (calc) (ref 1.0–2.5)
ALT: 24 U/L (ref 6–29)
AST: 23 U/L (ref 10–35)
Albumin: 4.2 g/dL (ref 3.6–5.1)
Alkaline phosphatase (APISO): 74 U/L (ref 37–153)
BUN: 12 mg/dL (ref 7–25)
CO2: 30 mmol/L (ref 20–32)
Calcium: 9.2 mg/dL (ref 8.6–10.4)
Chloride: 108 mmol/L (ref 98–110)
Creat: 0.6 mg/dL (ref 0.60–0.95)
Globulin: 2.8 g/dL (ref 1.9–3.7)
Glucose, Bld: 116 mg/dL — ABNORMAL HIGH (ref 65–99)
Potassium: 4.1 mmol/L (ref 3.5–5.3)
Sodium: 145 mmol/L (ref 135–146)
Total Bilirubin: 0.6 mg/dL (ref 0.2–1.2)
Total Protein: 7 g/dL (ref 6.1–8.1)
eGFR: 91 mL/min/{1.73_m2} (ref >=60)

## 2023-07-19 LAB — CBC WITH DIFFERENTIAL/PLATELET
Absolute Lymphocytes: 1338 {cells}/uL (ref 850–3900)
Absolute Monocytes: 472 {cells}/uL (ref 200–950)
Basophils Absolute: 20 {cells}/uL (ref 0–200)
Basophils Relative: 0.5 %
Eosinophils Absolute: 51 {cells}/uL (ref 15–500)
Eosinophils Relative: 1.3 %
HCT: 38.7 % (ref 35.0–45.0)
Hemoglobin: 12.7 g/dL (ref 11.7–15.5)
MCH: 31.1 pg (ref 27.0–33.0)
MCHC: 32.8 g/dL (ref 32.0–36.0)
MCV: 94.9 fL (ref 80.0–100.0)
MPV: 11.4 fL (ref 7.5–12.5)
Monocytes Relative: 12.1 %
Neutro Abs: 2020 {cells}/uL (ref 1500–7800)
Neutrophils Relative %: 51.8 %
Platelets: 193 10*3/uL (ref 140–400)
RBC: 4.08 10*6/uL (ref 3.80–5.10)
RDW: 13.2 % (ref 11.0–15.0)
Total Lymphocyte: 34.3 %
WBC: 3.9 10*3/uL (ref 3.8–10.8)

## 2023-07-19 LAB — LIPID PANEL
Cholesterol: 154 mg/dL (ref <200)
HDL: 71 mg/dL (ref >=50)
LDL Cholesterol (Calc): 58 mg/dL
Non-HDL Cholesterol (Calc): 83 mg/dL (ref <130)
Total CHOL/HDL Ratio: 2.2 (calc) (ref <5.0)
Triglycerides: 186 mg/dL — ABNORMAL HIGH (ref <150)

## 2023-08-30 ENCOUNTER — Other Ambulatory Visit: Payer: Self-pay | Admitting: Family Medicine

## 2023-08-30 DIAGNOSIS — E1159 Type 2 diabetes mellitus with other circulatory complications: Secondary | ICD-10-CM

## 2023-08-30 DIAGNOSIS — E1169 Type 2 diabetes mellitus with other specified complication: Secondary | ICD-10-CM

## 2023-09-14 ENCOUNTER — Other Ambulatory Visit: Payer: Self-pay | Admitting: Family Medicine

## 2023-09-14 DIAGNOSIS — E1159 Type 2 diabetes mellitus with other circulatory complications: Secondary | ICD-10-CM

## 2023-09-14 DIAGNOSIS — E785 Hyperlipidemia, unspecified: Secondary | ICD-10-CM

## 2023-09-15 ENCOUNTER — Encounter: Payer: Self-pay | Admitting: Family Medicine

## 2023-09-15 ENCOUNTER — Ambulatory Visit: Payer: Medicare PPO | Admitting: Family Medicine

## 2023-09-15 VITALS — BP 134/74 | HR 90 | Resp 16 | Ht 69.0 in | Wt 177.2 lb

## 2023-09-15 DIAGNOSIS — E1159 Type 2 diabetes mellitus with other circulatory complications: Secondary | ICD-10-CM | POA: Diagnosis not present

## 2023-09-15 DIAGNOSIS — E785 Hyperlipidemia, unspecified: Secondary | ICD-10-CM

## 2023-09-15 DIAGNOSIS — E1169 Type 2 diabetes mellitus with other specified complication: Secondary | ICD-10-CM

## 2023-09-15 DIAGNOSIS — G2581 Restless legs syndrome: Secondary | ICD-10-CM

## 2023-09-15 DIAGNOSIS — M8588 Other specified disorders of bone density and structure, other site: Secondary | ICD-10-CM

## 2023-09-15 DIAGNOSIS — E042 Nontoxic multinodular goiter: Secondary | ICD-10-CM

## 2023-09-15 DIAGNOSIS — M545 Low back pain, unspecified: Secondary | ICD-10-CM

## 2023-09-15 DIAGNOSIS — G8929 Other chronic pain: Secondary | ICD-10-CM

## 2023-09-15 DIAGNOSIS — I152 Hypertension secondary to endocrine disorders: Secondary | ICD-10-CM

## 2023-09-15 DIAGNOSIS — Z1231 Encounter for screening mammogram for malignant neoplasm of breast: Secondary | ICD-10-CM

## 2023-09-15 DIAGNOSIS — D708 Other neutropenia: Secondary | ICD-10-CM

## 2023-09-15 DIAGNOSIS — Z7984 Long term (current) use of oral hypoglycemic drugs: Secondary | ICD-10-CM

## 2023-09-15 LAB — POCT GLYCOSYLATED HEMOGLOBIN (HGB A1C): Hemoglobin A1C: 6.3 % — AB (ref 4.0–5.6)

## 2023-09-15 MED ORDER — SYNJARDY 12.5-500 MG PO TABS
1.0000 | ORAL_TABLET | Freq: Two times a day (BID) | ORAL | 3 refills | Status: AC
Start: 2023-09-15 — End: ?
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
## 2023-09-20 ENCOUNTER — Ambulatory Visit: Payer: Self-pay

## 2023-09-20 ENCOUNTER — Other Ambulatory Visit: Payer: Self-pay | Admitting: Family Medicine

## 2023-09-20 ENCOUNTER — Encounter: Payer: Self-pay | Admitting: Family Medicine

## 2023-09-20 DIAGNOSIS — E1159 Type 2 diabetes mellitus with other circulatory complications: Secondary | ICD-10-CM

## 2023-09-20 MED ORDER — LOSARTAN POTASSIUM 25 MG PO TABS: 25.0000 mg | ORAL_TABLET | Freq: Every day | ORAL | 0 refills | Status: DC

## 2023-09-20 MED ORDER — LOSARTAN POTASSIUM 25 MG PO TABS
25.0000 mg | ORAL_TABLET | Freq: Every day | ORAL | 0 refills | Status: DC
Start: 2023-09-20 — End: 2024-01-02

## 2023-09-20 NOTE — Telephone Encounter (Signed)
 Copied from CRM [id number]. Topic: Clinical - Red Word Triage >> Sep 20, 2023  9:04 AM Adrienne Anderson B wrote: [facility] that prompted transfer to Nurse Triage:  Patient blood pressure is 158/92 was told to call if her pressure keep being high so she could get on a new rx   Chief Complaint: High blood pressure Symptoms: No symptoms Frequency: intermittent Pertinent Negatives: Patient denies headache Disposition: [] ED /[] Urgent Care (no appt availability in office) / [] Appointment(In office/virtual)/ []  [facility]/ [] Home Care/ [] Refused Recommended Disposition /[]  Mobile Bus/ []  Follow-up with PCP Additional Notes: Pt advised to notify PCP if BP was elevated to determine if pt should be restarted on Talmasartan. Pt states that she has 3 elevated reading since this weekend. No symptoms at this time. Pt unable to schedule appt for 4/30 due to prior obligation, next avail after that is 5/14 but Pt would like to be seen sooner if possible so her BP doesn't get too high. Will route to clinic for f/u and scheduling   Reason for Disposition  [1] Systolic BP  >= 130 OR Diastolic >= 80 AND [2] not taking BP medications  Answer Assessment - Initial Assessment Questions 1. BLOOD PRESSURE: "What is the blood pressure?" "Did you take at least two measurements 5 minutes apart?"     1. 158/92  and 2. 149/88  2. ONSET: "When did you take your blood pressure?"     Taken this morning  3. HOW: "How did you take your blood pressure?" (e.g., automatic home BP monitor, visiting nurse)     Home cuff  4. HISTORY: "Do you have a history of high blood pressure?"     Yes  5. MEDICINES: "Are you taking any medicines for blood pressure?" "Have you missed any doses recently?"     Not taking anything at this time, was weaned off by PCP  6. OTHER SYMPTOMS: "Do you have any symptoms?" (e.g., blurred vision, chest pain, difficulty breathing, headache, weakness)     No  7. PREGNANCY: "Is there any  chance you are pregnant?" "When was your last menstrual period?"     No  Protocols used: Blood Pressure - High-A-AH

## 2023-09-20 NOTE — Telephone Encounter (Signed)
 Pt agreed for short supply to be sent to [facility]

## 2023-09-26 ENCOUNTER — Ambulatory Visit
Admission: EM | Admit: 2023-09-26 | Discharge: 2023-09-26 | Disposition: A | Discharge status: Home / Self Care | Attending: Emergency Medicine | Admitting: Emergency Medicine

## 2023-09-26 ENCOUNTER — Ambulatory Visit (INDEPENDENT_AMBULATORY_CARE_PROVIDER_SITE_OTHER)

## 2023-09-26 ENCOUNTER — Encounter: Payer: Self-pay | Admitting: Emergency Medicine

## 2023-09-26 DIAGNOSIS — J019 Acute sinusitis, unspecified: Secondary | ICD-10-CM | POA: Diagnosis not present

## 2023-09-26 DIAGNOSIS — J209 Acute bronchitis, unspecified: Secondary | ICD-10-CM | POA: Diagnosis not present

## 2023-09-26 MED ORDER — ALBUTEROL SULFATE HFA 108 (90 BASE) MCG/ACT IN AERS: 1.0000 | INHALATION_SPRAY | RESPIRATORY_TRACT | 0 refills | Status: DC | PRN

## 2023-09-26 MED ORDER — AEROCHAMBER MV MISC: 1 refills | Status: DC

## 2023-09-26 MED ORDER — FLUTICASONE PROPIONATE 50 MCG/ACT NA SUSP: 2.0000 | Freq: Every day | NASAL | 0 refills | Status: DC

## 2023-09-26 MED ORDER — PREDNISONE 20 MG PO TABS: 40.0000 mg | ORAL_TABLET | Freq: Every day | ORAL | 0 refills | Status: AC

## 2023-09-26 MED ORDER — AMOXICILLIN-POT CLAVULANATE 875-125 MG PO TABS: 1.0000 | ORAL_TABLET | Freq: Two times a day (BID) | ORAL | 0 refills | Status: DC

## 2023-09-26 NOTE — ED Provider Notes (Signed)
 HPI  SUBJECTIVE:  Adrienne Anderson is a 83 y.o. female who presents with 7 days of nasal congestion, green rhinorrhea, postnasal drip, cough productive of green sputum, wheezing, chest congestion.  No fevers, sinus pain or pressure, shortness of breath, dyspnea on exertion.  She is able to sleep at night without waking up coughing.  No antibiotics in the past month.  No antipyretic in the past 6 hours.  She has tried Alka-Seltzer plus, leftover cough syrup and Tylenol without improvement in her symptoms.  Symptoms are worse with exposure to cold air. She has a past medical history of well-controlled diabetes, hyperlipidemia, hypertension, pneumonia, bronchitis.  No history of pulmonary disease.  PCP: [facility].  Past Medical History:  Diagnosis Date   Cataract    Diabetes mellitus without complication (HCC)    Glaucoma    Hyperlipidemia    Hypertension    Obesity    Wears dentures    full upper    Past Surgical History:  Procedure Laterality Date   BREAST BIOPSY Bilateral    rt/clip-02/27/03, left - all neg   BREAST EXCISIONAL BIOPSY Right 1982   CATARACT EXTRACTION W/PHACO Right 07/29/2020   Procedure: CATARACT EXTRACTION PHACO AND INTRAOCULAR LENS PLACEMENT (IOC) RIGHT DIABETIC 4.08 00:33.1;  Surgeon: Clair Crews, MD;  Location: [facility];  Service: Ophthalmology;  Laterality: Right;  Diabetic - oral meds   CATARACT EXTRACTION W/PHACO Left 08/12/2020   Procedure: CATARACT EXTRACTION PHACO AND INTRAOCULAR LENS PLACEMENT (IOC) LEFT DIABETIC 5.32 00:32.2;  Surgeon: Clair Crews, MD;  Location: [facility];  Service: Ophthalmology;  Laterality: Left;  Diabetic - oral meds   FINE NEEDLE ASPIRATION Left 05/20/2020   Dr. Shelvy Dickens thyroid  -   VAGINAL HYSTERECTOMY  1986    Family History  Problem Relation Age of Onset   Depression Mother    Rheum arthritis Father    Hypertension Sister    Breast cancer Sister    Alcohol abuse Brother     Hypertension Sister    Heart disease Sister    Cancer Sister        lung   Hypertension Sister    Kidney disease Sister    COPD Sister    Heart failure Sister    Breast cancer Cousin        pat cousin   Hypertension Sister    Breast cancer Paternal Aunt     Social History   Tobacco Use   Smoking status: Never   Smokeless tobacco: Never   Tobacco comments:    smoking cessation materials not required  Vaping Use   Vaping status: Never Used  Substance Use Topics   Alcohol use: No    Alcohol/week: 0.0 standard drinks of alcohol   Drug use: No    No current facility-administered medications for this encounter.  Current Outpatient Medications:    albuterol  (VENTOLIN  HFA) 108 (90 Base) MCG/ACT inhaler, Inhale 1-2 puffs into the lungs every 4 (four) hours as needed for wheezing or shortness of breath., Disp: 1 each, Rfl: 0   amoxicillin-clavulanate (AUGMENTIN) 875-125 MG tablet, Take 1 tablet by mouth every 12 (twelve) hours., Disp: 14 tablet, Rfl: 0   fluticasone  (FLONASE) 50 MCG/ACT nasal spray, Place 2 sprays into both nostrils daily., Disp: 16 g, Rfl: 0   losartan  (COZAAR ) 25 MG tablet, Take 1 tablet (25 mg total) by mouth daily., Disp: 15 tablet, Rfl: 0   Spacer/Aero-Holding Chambers (AEROCHAMBER MV) inhaler, Use as instructed, Disp: 1 each, Rfl: 1  brimonidine  (ALPHAGAN ) 0.2 % ophthalmic solution, Place 1 drop into both eyes 2 (two) times daily., Disp: , Rfl:    Empagliflozin -metFORMIN  HCl (SYNJARDY ) 12.5-500 MG TABS, Take 1 tablet by mouth 2 (two) times daily., Disp: 180 tablet, Rfl: 3   glucose blood (ONE TOUCH ULTRA TEST) test strip, Use as instructed, Disp: 100 each, Rfl: 12   latanoprost (XALATAN) 0.005 % ophthalmic solution, Place 1 drop into both eyes at bedtime., Disp: , Rfl:    losartan  (COZAAR ) 25 MG tablet, Take 1 tablet (25 mg total) by mouth daily., Disp: 90 tablet, Rfl: 0   MULTIPLE VITAMIN PO, Take by mouth., Disp: , Rfl:    rosuvastatin  (CRESTOR ) 40 MG  tablet, TAKE 1 TABLET EVERY DAY, Disp: 90 tablet, Rfl: 3   VITAMIN D PO, Take 1,000 Units by mouth daily., Disp: , Rfl:   Allergies  Allergen Reactions   Clindamycin/Lincomycin Nausea Only     ROS  As noted in HPI.   Physical Exam  BP (!) 182/80 (BP Location: Left Arm)   Pulse 78   Temp 97.9 F (36.6 C) (Oral)   Resp 16   SpO2 97%   Constitutional: Well developed, well nourished, no acute distress Eyes: PERRL, EOMI, conjunctiva normal bilaterally HENT: Normocephalic, atraumatic,mucus membranes moist.  Purulent nasal congestion.  Erythematous, swollen turbinates.  No maxillary, frontal sinus tenderness.  No postnasal drip. Respiratory: Fuhs wheezing throughout all lung fields.  Fair air movement.  No anterior, lateral chest wall tenderness Cardiovascular: Normal rate and rhythm, no murmurs, no gallops, no rubs GI: nondistended skin: No rash, skin intact Musculoskeletal: no deformities Neurologic: Alert & oriented x 3, CN III-XII grossly intact, no motor deficits, sensation grossly intact Psychiatric: Speech and behavior appropriate   ED Course   Medications - No data to display  Orders Placed This Encounter  Procedures   DG Chest 2 View    Standing Status:   Standing    Number of Occurrences:   1    Reason for Exam (SYMPTOM  OR DIAGNOSIS REQUIRED):   cough   No results found for this or any previous visit (from the past 24 hours). DG Chest 2 View Result Date: 09/26/2023 CLINICAL DATA:  Cough for 1 week. EXAM: CHEST - 2 VIEW COMPARISON:  01/02/2023 FINDINGS: The cardiomediastinal contours are stable. The lungs are clear. Pulmonary vasculature is normal. No consolidation, pleural effusion, or pneumothorax. Mild thoracic spondylosis. No acute osseous abnormalities are seen. IMPRESSION: No acute findings. Electronically Signed   By: Chadwick Colonel M.D.   On: 09/26/2023 18:14    ED Clinical Impression  1. Acute non-recurrent sinusitis, unspecified location   2.  Bronchitis with bronchospasm      ED Assessment/Plan    1.  URI-suspect early sinusitis.  Saline nasal irrigation, Flonase, Mucinex.  Wait-and-see prescription of Augmentin.  2.  Bronchitis with bronchospasm.  Chest x-ray negative for pneumonia.  Discussed this with patient.  Albuterol  inhaler with a spacer, prednisone 40 mg for 5 days.  Discussed with patient that this will elevate her sugars.  Wait-and-see prescription of Augmentin.  Follow-up with PCP as needed.  ER return precautions given.  Reviewed imaging independently.  No pneumonia. See radiology report for full details.  Discussed  imaging, MDM, treatment plan, and plan for follow-up with patient Discussed sn/sx that should prompt return to the ED. patient agrees with plan.   Meds ordered this encounter  Medications   fluticasone  (FLONASE) 50 MCG/ACT nasal spray    Sig: Place 2  sprays into both nostrils daily.    Dispense:  16 g    Refill:  0   amoxicillin-clavulanate (AUGMENTIN) 875-125 MG tablet    Sig: Take 1 tablet by mouth every 12 (twelve) hours.    Dispense:  14 tablet    Refill:  0   albuterol  (VENTOLIN  HFA) 108 (90 Base) MCG/ACT inhaler    Sig: Inhale 1-2 puffs into the lungs every 4 (four) hours as needed for wheezing or shortness of breath.    Dispense:  1 each    Refill:  0   Spacer/Aero-Holding Chambers (AEROCHAMBER MV) inhaler    Sig: Use as instructed    Dispense:  1 each    Refill:  1      *This clinic note was created using Dragon dictation software. Therefore, there may be occasional mistakes despite careful proofreading. ?    Ethlyn Herd, MD 09/27/23 [phone]

## 2023-09-26 NOTE — ED Triage Notes (Signed)
 Pt presents with a cough x 1 week. She developed chest congestion 3 days ago.

## 2023-09-26 NOTE — Discharge Instructions (Signed)
 Start Mucinex to keep the mucous thin and to decongest you.    Most sinus infections are viral and do not need antibiotics unless you have a high fever, have had this for 10 days, or you get better and then get sick again.  I would wait a day or 2 to fill the Augmentin.  Use a NeilMed sinus rinse with distilled water as often as you want to to reduce nasal congestion. Follow the directions on the box.  Flonase nasal congestion and postnasal drip.  Take two puffs from your albuterol  inhaler with your spacer every 4 hours for 2 days, then every 6 hours for 2 days, then as needed. You can back off if you start to improve  sooner. Finish the steroids unless your doctor tells you to stop.  If you start the antibiotics, make sure to finish them, even if you feel better. . Return to the ER if you get worse, have a fever >100.4, or any other concerns.   If the spacer is too expensive at the pharmacy, you can get an AeroChamber Z-Stat off of [employer] for about $10-$15.  Go to www.goodrx.com  or www.costplusdrugs.com to look up your medications. This will give you a list of where you can find your prescriptions at the most affordable prices. Or ask the pharmacist what the cash price is, or if they have any other discount programs available to help make your medication more affordable. This can be less expensive than what you would pay with insurance.    Go to www.goodrx.com to look up your medications. This will give you a list of where you can find your prescriptions at the most affordable prices. Or you can ask the pharmacist what the cash price is. This is frequently cheaper than going through insurance.

## 2023-12-07 ENCOUNTER — Other Ambulatory Visit: Payer: Self-pay | Admitting: Family Medicine

## 2023-12-07 DIAGNOSIS — E1159 Type 2 diabetes mellitus with other circulatory complications: Secondary | ICD-10-CM

## 2023-12-17 ENCOUNTER — Other Ambulatory Visit: Payer: Self-pay | Admitting: Family Medicine

## 2023-12-17 DIAGNOSIS — E1169 Type 2 diabetes mellitus with other specified complication: Secondary | ICD-10-CM

## 2023-12-17 DIAGNOSIS — E785 Hyperlipidemia, unspecified: Secondary | ICD-10-CM

## 2023-12-19 NOTE — Telephone Encounter (Signed)
 Requested Prescriptions  Refused Prescriptions Disp Refills   rosuvastatin  (CRESTOR ) 40 MG tablet [Pharmacy Med Name: ROSUVASTATIN  CALCIUM  40 MG Oral Tablet] 90 tablet 3    Sig: TAKE 1 TABLET EVERY DAY     Cardiovascular:  Antilipid - Statins 2 Failed - 12/19/2023  1:47 PM      Failed - Lipid Panel in normal range within the last 12 months    Cholesterol, Total  Date Value Ref Range Status  11/04/2015 154 100 - 199 mg/dL Final   Cholesterol  Date Value Ref Range Status  07/18/2023 154 <200 mg/dL Final   LDL Cholesterol (Calc)  Date Value Ref Range Status  07/18/2023 58 mg/dL (calc) Final    Comment:    Reference range: <100 . Desirable range <100 mg/dL for primary prevention;   <70 mg/dL for patients with CHD or diabetic patients  with > or = 2 CHD risk factors. SABRA LDL-C is now calculated using the Martin-Hopkins  calculation, which is a validated novel method providing  better accuracy than the Friedewald equation in the  estimation of LDL-C.  Gladis APPLETHWAITE et al. SANDREA. 7986;689(80): 2061-2068  (http://education.[employer].com/faq/FAQ164)    HDL  Date Value Ref Range Status  07/18/2023 71 > OR = 50 mg/dL Final  93/86/7982 53 >60 mg/dL Final   Triglycerides  Date Value Ref Range Status  07/18/2023 186 (H) <150 mg/dL Final         Passed - Cr in normal range and within 360 days    Creat  Date Value Ref Range Status  07/18/2023 0.60 0.60 - 0.95 mg/dL Final   Creatinine, Urine  Date Value Ref Range Status  07/18/2023 58 20 - 275 mg/dL Final         Passed - Patient is not pregnant      Passed - Valid encounter within last 12 months    Recent Outpatient Visits           3 months ago Dyslipidemia associated with type 2 diabetes mellitus [facility])   [facility] Glenard Mire, MD   5 months ago Dyslipidemia associated with type 2 diabetes mellitus [facility])   [facility] Sowles, Krichna, MD       Future  Appointments             In 4 weeks Sowles, Krichna, MD [facility], [facility]

## 2023-12-30 DIAGNOSIS — H401132 Primary open-angle glaucoma, bilateral, moderate stage: Secondary | ICD-10-CM | POA: Diagnosis not present

## 2024-01-02 ENCOUNTER — Other Ambulatory Visit: Payer: Self-pay | Admitting: Family Medicine

## 2024-01-02 DIAGNOSIS — E1159 Type 2 diabetes mellitus with other circulatory complications: Secondary | ICD-10-CM

## 2024-01-17 ENCOUNTER — Ambulatory Visit: Admitting: Family Medicine

## 2024-01-17 ENCOUNTER — Encounter: Payer: Self-pay | Admitting: Family Medicine

## 2024-01-17 VITALS — BP 118/66 | HR 87 | Resp 16 | Ht 69.0 in | Wt 179.4 lb

## 2024-01-17 DIAGNOSIS — G2581 Restless legs syndrome: Secondary | ICD-10-CM | POA: Insufficient documentation

## 2024-01-17 DIAGNOSIS — M8588 Other specified disorders of bone density and structure, other site: Secondary | ICD-10-CM | POA: Diagnosis not present

## 2024-01-17 DIAGNOSIS — E1159 Type 2 diabetes mellitus with other circulatory complications: Secondary | ICD-10-CM

## 2024-01-17 DIAGNOSIS — G8929 Other chronic pain: Secondary | ICD-10-CM | POA: Insufficient documentation

## 2024-01-17 DIAGNOSIS — I152 Hypertension secondary to endocrine disorders: Secondary | ICD-10-CM | POA: Diagnosis not present

## 2024-01-17 DIAGNOSIS — D708 Other neutropenia: Secondary | ICD-10-CM

## 2024-01-17 DIAGNOSIS — M545 Low back pain, unspecified: Secondary | ICD-10-CM | POA: Diagnosis not present

## 2024-01-17 DIAGNOSIS — E1169 Type 2 diabetes mellitus with other specified complication: Secondary | ICD-10-CM | POA: Diagnosis not present

## 2024-01-17 DIAGNOSIS — E042 Nontoxic multinodular goiter: Secondary | ICD-10-CM

## 2024-01-17 DIAGNOSIS — Z7984 Long term (current) use of oral hypoglycemic drugs: Secondary | ICD-10-CM

## 2024-01-17 DIAGNOSIS — E785 Hyperlipidemia, unspecified: Secondary | ICD-10-CM

## 2024-01-17 LAB — POCT GLYCOSYLATED HEMOGLOBIN (HGB A1C): Hemoglobin A1C: 6.4 % — AB (ref 4.0–5.6)

## 2024-01-17 MED ORDER — ROSUVASTATIN CALCIUM 40 MG PO TABS
40.0000 mg | ORAL_TABLET | Freq: Every day | ORAL | 3 refills | Status: AC
Start: 2024-01-17 — End: ?

## 2024-01-17 MED ORDER — TELMISARTAN 20 MG PO TABS: 20.0000 mg | ORAL_TABLET | Freq: Every day | ORAL | 1 refills | Status: DC

## 2024-01-17 NOTE — Patient Instructions (Signed)
 Telmisartan  in place of losartan  Take  half pill to keep bp between 120-139, take full pill if bp consistently above  140

## 2024-01-17 NOTE — Progress Notes (Signed)
 Name: Adrienne Anderson   MRN: [id number]    DOB: [birth year]   Date:01/17/2024       Progress Note  Subjective  Chief Complaint  Chief Complaint  Patient presents with   Medical Management of Chronic Issues    Pt states since starting losartan  she has noticed some swelling in her L leg mainly   Discussed the use of AI scribe software for clinical note transcription with the patient, who gave verbal consent to proceed.  History of Present Illness Adrienne Anderson is an 83 year old female with type 2 diabetes and hypertension who presents for a four-month follow-up visit.  Her type 2 diabetes is well-controlled with an A1c of 6.4%, slightly up from 6.3% four months ago. She experiences no symptoms of hypoglycemia and adheres to a diabetic diet and medication regimen, including Synjardy  12.5/500 mg taken twice daily, which is covered by her insurance.  Her hypertension was previously managed with losartan  25 mg, but she experienced swelling while on this medication. She recalls previously taking telmisartan  HCTZ, which was stopped in 2023. Her blood pressure was 146 systolic at the last visit after stopping all blood pressure medications, but it has since been controlled with losartan . No dizziness upon standing or orthopnea is reported.  She has a history of dyslipidemia associated with diabetes and is currently taking rosuvastatin  40 mg. She is due for a refill in October.  She has a multinodular goiter but reports no issues with swallowing. She also has osteopenia in her lumbar spine and is scheduled for a bone density test on September 8th. She follows a regimen of physical activity, high calcium  diet, and vitamin D supplementation.  She has  leukopenia with low neutrophil counts, which is monitored annually. No anemia is reported.  She experiences occasional restless leg symptoms and low back pain, which she manages with Tylenol. The back pain does not radiate down her leg.    Patient  Active Problem List   Diagnosis Date Noted   Chronic midline low back pain without sciatica 01/17/2024   RLS (restless legs syndrome) 01/17/2024   Other neutropenia (HCC) 12/08/2021   Back pain with radiculopathy 12/08/2021   Osteopenia of lumbar spine 12/08/2021   Benign hypertension 12/08/2021   Multinodular goiter 12/08/2021   Glaucoma 08/24/2014   Cataract 08/24/2014   Dyslipidemia associated with type 2 diabetes mellitus (HCC) 08/24/2014   Hypertension associated with diabetes (HCC) 08/24/2014    Past Surgical History:  Procedure Laterality Date   BREAST BIOPSY Bilateral    rt/clip-02/27/03, left - all neg   BREAST EXCISIONAL BIOPSY Right 1982   CATARACT EXTRACTION W/PHACO Right 07/29/2020   Procedure: CATARACT EXTRACTION PHACO AND INTRAOCULAR LENS PLACEMENT (IOC) RIGHT DIABETIC 4.08 00:33.1;  Surgeon: Jaye Fallow, MD;  Location: [facility];  Service: Ophthalmology;  Laterality: Right;  Diabetic - oral meds   CATARACT EXTRACTION W/PHACO Left 08/12/2020   Procedure: CATARACT EXTRACTION PHACO AND INTRAOCULAR LENS PLACEMENT (IOC) LEFT DIABETIC 5.32 00:32.2;  Surgeon: Jaye Fallow, MD;  Location[facility];  Service: Ophthalmology;  Laterality: Left;  Diabetic - oral meds   FINE NEEDLE ASPIRATION Left 05/20/2020   Dr. Cherilyn thyroid  -   VAGINAL HYSTERECTOMY  1986    Family History  Problem Relation Age of Onset   Depression Mother    Rheum arthritis Father    Hypertension Sister    Breast cancer Sister    Alcohol abuse Brother    Hypertension Sister    Heart disease  Sister    Cancer Sister        lung   Hypertension Sister    Kidney disease Sister    COPD Sister    Heart failure Sister    Breast cancer Cousin        pat cousin   Hypertension Sister    Breast cancer Paternal Aunt     Social History   Tobacco Use   Smoking status: Never   Smokeless tobacco: Never   Tobacco comments:    smoking cessation materials not required   Substance Use Topics   Alcohol use: No    Alcohol/week: 0.0 standard drinks of alcohol     Current Outpatient Medications:    Empagliflozin -metFORMIN  HCl (SYNJARDY ) 12.5-500 MG TABS, Take 1 tablet by mouth 2 (two) times daily., Disp: 180 tablet, Rfl: 3   glucose blood (ONE TOUCH ULTRA TEST) test strip, Use as instructed, Disp: 100 each, Rfl: 12   latanoprost (XALATAN) 0.005 % ophthalmic solution, Place 1 drop into both eyes at bedtime., Disp: , Rfl:    MULTIPLE VITAMIN PO, Take by mouth., Disp: , Rfl:    SIMBRINZA 1-0.2 % SUSP, Apply to eye., Disp: , Rfl:    telmisartan  (MICARDIS ) 20 MG tablet, Take 1 tablet (20 mg total) by mouth daily., Disp: 90 tablet, Rfl: 1   VITAMIN D PO, Take 1,000 Units by mouth daily., Disp: , Rfl:    rosuvastatin  (CRESTOR ) 40 MG tablet, Take 1 tablet (40 mg total) by mouth daily., Disp: 90 tablet, Rfl: 3  Allergies  Allergen Reactions   Clindamycin/Lincomycin Nausea Only    I personally reviewed active problem list, medication list, allergies, family history with the patient/caregiver today.   ROS  Ten systems reviewed and is negative except as mentioned in HPI    Objective Physical Exam VITALS: BP- 118/66 MEASUREMENTS: Weight- 179. CONSTITUTIONAL: Patient appears well-developed and well-nourished. No distress. HEENT: Head atraumatic, normocephalic, neck supple. CARDIOVASCULAR: Normal rate, regular rhythm and normal heart sounds. No murmur heard. Mild edema in ankles, no pain. PULMONARY: Effort normal and breath sounds normal. Lungs clear to auscultation. No respiratory distress. ABDOMINAL: There is no tenderness or distention. MUSCULOSKELETAL: Normal gait. Without gross motor or sensory deficit. PSYCHIATRIC: Patient has a normal mood and affect. Behavior is normal. Judgment and thought content normal.  Vitals:   01/17/24 0932  BP: 118/66  Pulse: 87  Resp: 16  SpO2: 97%  Weight: 179 lb 6.4 oz (81.4 kg)  Height: 5' 9 (1.753 m)    Body  mass index is 26.49 kg/m.  Recent Results (from the past 2160 hours)  POCT glycosylated hemoglobin (Hb A1C)     Status: Abnormal   Collection Time: 01/17/24  9:37 AM  Result Value Ref Range   Hemoglobin A1C 6.4 (A) 4.0 - 5.6 %   HbA1c POC (<> result, manual entry)     HbA1c, POC (prediabetic range)     HbA1c, POC (controlled diabetic range)      PHQ2/9:    01/17/2024    9:25 AM 07/18/2023   10:38 AM 04/15/2023    8:53 AM 12/13/2022    8:47 AM 12/09/2022   12:06 PM  Depression screen PHQ 2/9  Decreased Interest 0 0 0 0 0  Down, Depressed, Hopeless 0 0 0 0 0  PHQ - 2 Score 0 0 0 0 0  Altered sleeping  0 0 0   Tired, decreased energy  0 0 0   Change in appetite  0 0 0  Feeling bad or failure about yourself   0 0 0   Trouble concentrating  0 0 0   Moving slowly or fidgety/restless  0 0 0   Suicidal thoughts  0 0 0   PHQ-9 Score  0 0 0   Difficult doing work/chores  Not difficult at all       phq 9 is negative  Fall Risk:    01/17/2024    9:25 AM 07/18/2023   10:32 AM 04/15/2023    8:53 AM 12/13/2022    8:47 AM 12/09/2022   12:00 PM  Fall Risk   Falls in the past year? 0 0 0 0 0  Number falls in past yr: 0 0   0  Injury with Fall? 0 0   0  Risk for fall due to : No Fall Risks No Fall Risks No Fall Risks No Fall Risks No Fall Risks  Follow up Falls evaluation completed Falls prevention discussed;Education provided;Falls evaluation completed Falls prevention discussed Falls prevention discussed Education provided;Falls prevention discussed      Assessment & Plan Type 2 diabetes mellitus with dyslipidemia and HTN Well controlled with A1c of 6.4%. - Continue Synjardy  12.5/500 mg, one tablet twice daily.  Hypertension Blood pressure improved to 118/66 on losartan  25 mg. Suspected losartan -related swelling, though unlikely. Plan to switch to telmisartan  due to previous efficacy. - Discontinue losartan  25 mg. - Initiate telmisartan  (Micardis ) 20 mg, adjust dose based on  blood pressure. - Monitor blood pressure at home, target 120-139 mmHg.  Dyslipidemia Managed with rosuvastatin  40 mg. - Refill rosuvastatin  40 mg prescription, due in October.  Bilateral lower extremity edema Edema at ankles, likely non-cardiac. Possible factors: diuretic discontinuation, heat, dietary salt. - Consider compression stockings. - Elevate legs when sitting. - Monitor swelling, especially with temperature changes.  Osteopenia of lumbar spine Previously identified, low fracture risk. Awaiting bone density results. - Continue high calcium  diet and vitamin D. - Await bone density test results for medication decision.  Chronic neutropenia Chronic low white count, no symptoms. Last count slightly improved. - Monitor white blood cell count annually.  Chronic low back pain Managed with occasional Tylenol, no radicular symptoms. - Continue Tylenol as needed.  General Health Maintenance Discussed flu vaccination timing. - Advise flu shot in late September or October. - Document flu shot if received externally.

## 2024-01-30 ENCOUNTER — Ambulatory Visit
Admission: RE | Admit: 2024-01-30 | Discharge: 2024-01-30 | Disposition: A | Discharge status: Home / Self Care | Source: Ambulatory Visit | Attending: Family Medicine | Admitting: Family Medicine

## 2024-01-30 ENCOUNTER — Ambulatory Visit: Payer: Self-pay | Admitting: Family Medicine

## 2024-01-30 DIAGNOSIS — M8589 Other specified disorders of bone density and structure, multiple sites: Secondary | ICD-10-CM | POA: Diagnosis not present

## 2024-01-30 DIAGNOSIS — Z1231 Encounter for screening mammogram for malignant neoplasm of breast: Secondary | ICD-10-CM | POA: Insufficient documentation

## 2024-01-30 DIAGNOSIS — M8588 Other specified disorders of bone density and structure, other site: Secondary | ICD-10-CM | POA: Diagnosis not present

## 2024-01-30 DIAGNOSIS — Z780 Asymptomatic menopausal state: Secondary | ICD-10-CM | POA: Diagnosis not present

## 2024-02-27 ENCOUNTER — Ambulatory Visit (INDEPENDENT_AMBULATORY_CARE_PROVIDER_SITE_OTHER)

## 2024-02-27 DIAGNOSIS — Z23 Encounter for immunization: Secondary | ICD-10-CM | POA: Diagnosis not present

## 2024-03-28 DIAGNOSIS — H401132 Primary open-angle glaucoma, bilateral, moderate stage: Secondary | ICD-10-CM | POA: Diagnosis not present

## 2024-03-28 DIAGNOSIS — H43813 Vitreous degeneration, bilateral: Secondary | ICD-10-CM | POA: Diagnosis not present

## 2024-03-29 ENCOUNTER — Ambulatory Visit (INDEPENDENT_AMBULATORY_CARE_PROVIDER_SITE_OTHER)

## 2024-03-29 VITALS — BP 126/74 | Ht 69.0 in | Wt 177.4 lb

## 2024-03-29 DIAGNOSIS — Z0000 Encounter for general adult medical examination without abnormal findings: Secondary | ICD-10-CM

## 2024-03-29 NOTE — Progress Notes (Signed)
 Subjective:   Adrienne Anderson is a 83 y.o. female who presents for a Medicare Annual Wellness Visit.  Allergies (verified) Clindamycin/lincomycin   History: Past Medical History:  Diagnosis Date   Cataract    Diabetes mellitus without complication (HCC)    Glaucoma    Hyperlipidemia    Hypertension    Obesity    Wears dentures    full upper   Past Surgical History:  Procedure Laterality Date   BREAST BIOPSY Bilateral    rt/clip-02/27/03, left - all neg   BREAST EXCISIONAL BIOPSY Right 1982   CATARACT EXTRACTION W/PHACO Right 07/29/2020   Procedure: CATARACT EXTRACTION PHACO AND INTRAOCULAR LENS PLACEMENT (IOC) RIGHT DIABETIC 4.08 00:33.1;  Surgeon: Jaye Fallow, MD;  Location: [facility];  Service: Ophthalmology;  Laterality: Right;  Diabetic - oral meds   CATARACT EXTRACTION W/PHACO Left 08/12/2020   Procedure: CATARACT EXTRACTION PHACO AND INTRAOCULAR LENS PLACEMENT (IOC) LEFT DIABETIC 5.32 00:32.2;  Surgeon: Jaye Fallow, MD;  Location: [facility];  Service: Ophthalmology;  Laterality: Left;  Diabetic - oral meds   FINE NEEDLE ASPIRATION Left 05/20/2020   Dr. Cherilyn thyroid  -   VAGINAL HYSTERECTOMY  1986   Family History  Problem Relation Age of Onset   Depression Mother    Rheum arthritis Father    Hypertension Sister    Breast cancer Sister    Alcohol abuse Brother    Hypertension Sister    Heart disease Sister    Cancer Sister        lung   Hypertension Sister    Kidney disease Sister    COPD Sister    Heart failure Sister    Breast cancer Cousin        pat cousin   Hypertension Sister    Breast cancer Paternal Aunt    Social History   Occupational History   Occupation: Retired  Tobacco Use   Smoking status: Never   Smokeless tobacco: Never   Tobacco comments:    smoking cessation materials not required  Vaping Use   Vaping status: Never Used  Substance and Sexual Activity   Alcohol use: No    Alcohol/week: 0.0  standard drinks of alcohol   Drug use: No   Sexual activity: Not Currently    Partners: Male   Tobacco Counseling Counseling given: Not Answered Tobacco comments: smoking cessation materials not required  SDOH Screenings   Food Insecurity: No Food Insecurity (12/09/2022)  Housing: Low Risk  (12/09/2022)  Transportation Needs: No Transportation Needs (12/09/2022)  Utilities: Not At Risk (12/09/2022)  Alcohol Screen: Low Risk  (12/09/2022)  Depression (PHQ2-9): Low Risk  (01/17/2024)  Financial Resource Strain: Low Risk  (12/09/2022)  Physical Activity: Inactive (12/09/2022)  Social Connections: Unknown (12/09/2022)  Stress: No Stress Concern Present (12/09/2022)  Tobacco Use: Low Risk  (03/29/2024)  Health Literacy: Adequate Health Literacy (12/09/2022)   Depression Screen    01/17/2024    9:25 AM 07/18/2023   10:38 AM 04/15/2023    8:53 AM 12/13/2022    8:47 AM 12/09/2022   12:06 PM 08/12/2022    8:48 AM 06/14/2022    9:00 AM  PHQ 2/9 Scores  PHQ - 2 Score 0 0 0 0 0 0 0  PHQ- 9 Score  0  0  0   0  0      Data saved with a previous flowsheet row definition     Goals Addressed   None    Fall Screening Falls in  the past year?: 0 Number of falls in past year: 0 Was there an injury with Fall?: 0 Fall Risk Category Calculator: 0 Patient Fall Risk Level: Low Fall Risk  Fall Risk Patient at Risk for Falls Due to: No Fall Risks Fall risk Follow up: Falls evaluation completed        Objective:    Today's Vitals   03/29/24 1515  BP: 126/74  Weight: 177 lb 6.4 oz (80.5 kg)  Height: 5' 9 (1.753 m)   Body mass index is 26.2 kg/m.  Current Medications (verified) Outpatient Encounter Medications as of 03/29/2024  Medication Sig   Empagliflozin -metFORMIN  HCl (SYNJARDY ) 12.5-500 MG TABS Take 1 tablet by mouth 2 (two) times daily.   glucose blood (ONE TOUCH ULTRA TEST) test strip Use as instructed   latanoprost (XALATAN) 0.005 % ophthalmic solution Place 1 drop into both eyes at  bedtime.   MULTIPLE VITAMIN PO Take by mouth.   rosuvastatin  (CRESTOR ) 40 MG tablet Take 1 tablet (40 mg total) by mouth daily.   SIMBRINZA 1-0.2 % SUSP Apply to eye.   telmisartan  (MICARDIS ) 20 MG tablet Take 1 tablet (20 mg total) by mouth daily.   VITAMIN D PO Take 1,000 Units by mouth daily.   No facility-administered encounter medications on file as of 03/29/2024.   Hearing/Vision screen No results found. Immunizations and Health Maintenance Health Maintenance  Topic Date Due   Medicare Annual Wellness (AWV)  12/09/2023   COVID-19 Vaccine (6 - 2025-26 season) 01/23/2024   OPHTHALMOLOGY EXAM  06/26/2024   Diabetic kidney evaluation - eGFR measurement  07/17/2024   Diabetic kidney evaluation - Urine ACR  07/17/2024   FOOT EXAM  07/17/2024   HEMOGLOBIN A1C  07/19/2024   Mammogram  01/29/2025   DTaP/Tdap/Td (3 - Td or Tdap) 03/05/2030   Pneumococcal Vaccine: 50+ Years  Completed   Influenza Vaccine  Completed   DEXA SCAN  Completed   Zoster Vaccines- Shingrix   Completed   Meningococcal B Vaccine  Aged Out   Hepatitis C Screening  Discontinued   Fecal DNA (Cologuard)  Discontinued        Assessment/Plan:  This is a routine wellness examination for Adrienne Anderson.  Patient Care Team: Sowles, Krichna, MD as PCP - General (Family Medicine) Jaye Fallow, MD as Consulting Physician (Ophthalmology)  I have personally reviewed and noted the following in the patient's chart:   Medical and social history Use of alcohol, tobacco or illicit drugs  Current medications and supplements including opioid prescriptions. Functional ability and status Nutritional status Physical activity Advanced directives List of other physicians Hospitalizations, surgeries, and ER visits in previous 12 months Vitals Screenings to include cognitive, depression, and falls Referrals and appointments  No orders of the defined types were placed in this encounter.  In addition, I have reviewed and  discussed with patient certain preventive protocols, quality metrics, and best practice recommendations. A written personalized care plan for preventive services as well as general preventive health recommendations were provided to patient.   Jhonnie GORMAN Das, LPN   88/07/7972   No follow-ups on file.  After Visit Summary: (In Person-Printed) AVS printed and given to the patient  Nurse Notes: NONE

## 2024-03-29 NOTE — Patient Instructions (Addendum)
 [name],  Thank you for taking the time for your Medicare Wellness Visit. I appreciate your continued commitment to your health goals. Please review the care plan we discussed, and feel free to reach out if I can assist you further.  Please note that Annual Wellness Visits do not include a physical exam. Some assessments may be limited, especially if the visit was conducted virtually. If needed, we may recommend an in-person follow-up with your provider.  Ongoing Care Seeing your primary care provider every 3 to 6 months helps us  monitor your health and provide consistent, personalized care.   Referrals If a referral was made during today's visit and you haven't received any updates within two weeks, please contact the referred provider directly to check on the status.  Recommended Screenings:  Health Maintenance  Topic Date Due   COVID-19 Vaccine (6 - 2025-26 season) 01/23/2024   Eye exam for diabetics  06/26/2024   Yearly kidney function blood test for diabetes  07/17/2024   Yearly kidney health urinalysis for diabetes  07/17/2024   Complete foot exam   07/17/2024   Hemoglobin A1C  07/19/2024   Breast Cancer Screening  01/29/2025   Medicare Annual Wellness Visit  03/29/2025   DEXA scan (bone density measurement)  01/30/2027   DTaP/Tdap/Td vaccine (3 - Td or Tdap) 03/05/2030   Pneumococcal Vaccine for age over 24  Completed   Flu Shot  Completed   Zoster (Shingles) Vaccine  Completed   Meningitis B Vaccine  Aged Out   Hepatitis C Screening  Discontinued   Cologuard (Stool DNA test)  Discontinued       03/29/2024    3:21 PM  Advanced Directives  Does Patient Have a Medical Advance Directive? Yes  Type of Adrienne Anderson;Living will  Does patient want to make changes to medical advance directive? No - Patient declined  Copy of Healthcare Power of Attorney in Chart? Yes - validated most recent copy scanned in chart (See row information)     Vision: Annual vision screenings are recommended for early detection of glaucoma, cataracts, and diabetic retinopathy. These exams can also reveal signs of chronic conditions such as diabetes and high blood pressure.  Dental: Annual dental screenings help detect early signs of oral cancer, gum disease, and other conditions linked to overall health, including heart disease and diabetes.  Please see the attached documents for additional preventive care recommendations.   NEXT AWV 04/04/25 @ 9:30 AM IN PERSON Take care & I will see you next year!  Adrienne Anderson

## 2024-04-02 DIAGNOSIS — H401132 Primary open-angle glaucoma, bilateral, moderate stage: Secondary | ICD-10-CM | POA: Diagnosis not present

## 2024-04-02 DIAGNOSIS — H43813 Vitreous degeneration, bilateral: Secondary | ICD-10-CM | POA: Diagnosis not present

## 2024-04-02 DIAGNOSIS — Z961 Presence of intraocular lens: Secondary | ICD-10-CM | POA: Diagnosis not present

## 2024-04-02 LAB — OPHTHALMOLOGY REPORT-SCANNED

## 2024-05-28 ENCOUNTER — Encounter: Payer: Self-pay | Admitting: Family Medicine

## 2024-05-28 ENCOUNTER — Ambulatory Visit: Admitting: Family Medicine

## 2024-05-28 VITALS — BP 128/74 | HR 69 | Resp 18 | Ht 69.0 in | Wt 176.7 lb

## 2024-05-28 DIAGNOSIS — E042 Nontoxic multinodular goiter: Secondary | ICD-10-CM

## 2024-05-28 DIAGNOSIS — M8588 Other specified disorders of bone density and structure, other site: Secondary | ICD-10-CM

## 2024-05-28 DIAGNOSIS — I152 Hypertension secondary to endocrine disorders: Secondary | ICD-10-CM

## 2024-05-28 DIAGNOSIS — Z7984 Long term (current) use of oral hypoglycemic drugs: Secondary | ICD-10-CM

## 2024-05-28 DIAGNOSIS — E1129 Type 2 diabetes mellitus with other diabetic kidney complication: Secondary | ICD-10-CM

## 2024-05-28 DIAGNOSIS — R809 Proteinuria, unspecified: Secondary | ICD-10-CM

## 2024-05-28 DIAGNOSIS — E1159 Type 2 diabetes mellitus with other circulatory complications: Secondary | ICD-10-CM

## 2024-05-28 DIAGNOSIS — E785 Hyperlipidemia, unspecified: Secondary | ICD-10-CM

## 2024-05-28 DIAGNOSIS — M545 Low back pain, unspecified: Secondary | ICD-10-CM

## 2024-05-28 DIAGNOSIS — G8929 Other chronic pain: Secondary | ICD-10-CM

## 2024-05-28 DIAGNOSIS — E1169 Type 2 diabetes mellitus with other specified complication: Secondary | ICD-10-CM

## 2024-05-28 LAB — POCT GLYCOSYLATED HEMOGLOBIN (HGB A1C): Hemoglobin A1C: 6.2 % — AB (ref 4.0–5.6)

## 2024-05-28 MED ORDER — TELMISARTAN 20 MG PO TABS: 20.0000 mg | ORAL_TABLET | Freq: Every day | ORAL | 1 refills | Status: AC

## 2024-05-28 NOTE — Progress Notes (Signed)
 Name: DENIM START   MRN: [id number]    DOB: [birth year]   Date:05/28/2024       Progress Note  Subjective  Chief Complaint  Chief Complaint  Patient presents with   Medical Management of Chronic Issues   Discussed the use of AI scribe software for clinical note transcription with the patient, who gave verbal consent to proceed.  History of Present Illness Adrienne Anderson is an 84 year old female with type 2 diabetes, dyslipidemia, and hypertension who presents for routine follow-up.  Her type 2 diabetes is managed with Synjardy  12.5/500 mg twice daily. Her A1c was 6.2, with previous values of 6.4 in August, 6.3 before that, and 6.7 last year. She reports no increased hunger, thirst, or frequent urination.  She is on rosuvastatin  for dyslipidemia and reports no muscle aches. Her lipid panel last year showed satisfactory cholesterol levels with borderline triglycerides.  She takes Micardis  20 mg for hypertension, and her blood pressure at the visit was 128/74. She reports some ankle swelling that goes down at night and denies calf pain.  She has a history of chronic leukopenia, which is monitored annually, with recent blood work showing normal range.  She has microalbuminuria and takes SGL2 agonist  and Micardis .  She has a history of goiter but currently has no issues with swallowing. She denies symptoms of restless leg syndrome.  She experiences chronic mid-low back pain attributed to posture, which is not severe and does not radiate down her legs.  She has osteopenia. Her last bone density scan in September showed a 5.4% risk of major fracture and a 1.1% risk of hip fracture over the next ten years.  She reports bothersome hair growth under her chin, which is more prominent on the left side and grows quickly.    Patient Active Problem List   Diagnosis Date Noted   Chronic midline low back pain without sciatica 01/17/2024   RLS (restless legs syndrome) 01/17/2024   Other  neutropenia 12/08/2021   Back pain with radiculopathy 12/08/2021   Osteopenia of lumbar spine 12/08/2021   Benign hypertension 12/08/2021   Multinodular goiter 12/08/2021   Glaucoma 08/24/2014   Cataract 08/24/2014   Dyslipidemia associated with type 2 diabetes mellitus (HCC) 08/24/2014   Hypertension associated with diabetes (HCC) 08/24/2014    Past Surgical History:  Procedure Laterality Date   BREAST BIOPSY Bilateral    rt/clip-02/27/03, left - all neg   BREAST EXCISIONAL BIOPSY Right 1982   CATARACT EXTRACTION W/PHACO Right 07/29/2020   Procedure: CATARACT EXTRACTION PHACO AND INTRAOCULAR LENS PLACEMENT (IOC) RIGHT DIABETIC 4.08 00:33.1;  Surgeon: Jaye Fallow, MD;  Location: [facility];  Service: Ophthalmology;  Laterality: Right;  Diabetic - oral meds   CATARACT EXTRACTION W/PHACO Left 08/12/2020   Procedure: CATARACT EXTRACTION PHACO AND INTRAOCULAR LENS PLACEMENT (IOC) LEFT DIABETIC 5.32 00:32.2;  Surgeon: Jaye Fallow, MD;  Location: [facility];  Service: Ophthalmology;  Laterality: Left;  Diabetic - oral meds   FINE NEEDLE ASPIRATION Left 05/20/2020   Dr. Cherilyn thyroid  -   VAGINAL HYSTERECTOMY  1986    Family History  Problem Relation Age of Onset   Depression Mother    Rheum arthritis Father    Hypertension Sister    Breast cancer Sister    Alcohol abuse Brother    Hypertension Sister    Heart disease Sister    Cancer Sister        lung   Hypertension Sister    Kidney disease  Sister    COPD Sister    Heart failure Sister    Breast cancer Cousin        pat cousin   Hypertension Sister    Breast cancer Paternal Aunt     Social History   Tobacco Use   Smoking status: Never   Smokeless tobacco: Never   Tobacco comments:    smoking cessation materials not required  Substance Use Topics   Alcohol use: No    Alcohol/week: 0.0 standard drinks of alcohol    Current Medications[1]  Allergies[2]  I personally reviewed  active problem list, medication list, allergies, family history with the patient/caregiver today.   ROS  Ten systems reviewed and is negative except as mentioned in HPI    Objective Physical Exam  CONSTITUTIONAL: Patient appears well-developed and well-nourished. No distress. HEENT: Head atraumatic, normocephalic, neck supple. CARDIOVASCULAR: Normal rate, regular rhythm and normal heart sounds. No murmur heard. Mild right ankle swelling. No calf pain on palpation. PULMONARY: Effort normal and breath sounds normal. No respiratory distress. MUSCULOSKELETAL: Normal gait. Without gross motor or sensory deficit. PSYCHIATRIC: Patient has a normal mood and affect. Behavior is normal. Judgment and thought content normal.  Vitals:   05/28/24 1023  BP: 128/74  Pulse: 69  Resp: 18  SpO2: 94%  Weight: 176 lb 11.2 oz (80.2 kg)  Height: 5' 9 (1.753 m)    Body mass index is 26.09 kg/m.  Recent Results (from the past 2160 hours)  OPHTHALMOLOGY REPORT-SCANNED     Status: None   Collection Time: 04/02/24  3:09 PM  Result Value Ref Range   HM Diabetic Eye Exam No Retinopathy No Retinopathy    Comment: Abstracted by HIM   A Comment    POCT glycosylated hemoglobin (Hb A1C)     Status: Abnormal   Collection Time: 05/28/24 10:33 AM  Result Value Ref Range   Hemoglobin A1C 6.2 (A) 4.0 - 5.6 %   HbA1c POC (<> result, manual entry)     HbA1c, POC (prediabetic range)     HbA1c, POC (controlled diabetic range)        PHQ2/9:    05/28/2024   10:22 AM 03/29/2024    3:30 PM 01/17/2024    9:25 AM 07/18/2023   10:38 AM 04/15/2023    8:53 AM  Depression screen PHQ 2/9  Decreased Interest 0 0 0 0 0  Down, Depressed, Hopeless 0 0 0 0 0  PHQ - 2 Score 0 0 0 0 0  Altered sleeping 0 0  0 0  Tired, decreased energy 0 0  0 0  Change in appetite 0 0  0 0  Feeling bad or failure about yourself  0 0  0 0  Trouble concentrating 0 0  0 0  Moving slowly or fidgety/restless 0 0  0 0  Suicidal thoughts  0 0  0 0  PHQ-9 Score 0 0  0  0   Difficult doing work/chores Not difficult at all Not difficult at all  Not difficult at all      Data saved with a previous flowsheet row definition    phq 9 is negative  Fall Risk:    05/28/2024   10:22 AM 03/29/2024    3:21 PM 01/17/2024    9:25 AM 07/18/2023   10:32 AM 04/15/2023    8:53 AM  Fall Risk   Falls in the past year? 0 0 0 0 0  Number falls in past yr: 0 0 0 0  Injury with Fall? 0 0  0  0    Risk for fall due to : No Fall Risks No Fall Risks No Fall Risks No Fall Risks No Fall Risks  Follow up Falls evaluation completed Falls evaluation completed;Falls prevention discussed Falls evaluation completed Falls prevention discussed;Education provided;Falls evaluation completed Falls prevention discussed     Data saved with a previous flowsheet row definition     Assessment & Plan Type 2 diabetes mellitus with microalbuminuria, hypertension, and dyslipidemia Diabetes well-controlled with A1c 6.2. Blood pressure stable at 128/74 mmHg on reduced Micardis . Dyslipidemia managed with rosuvastatin . Microalbuminuria present, kidney function stable. No Synjardy  side effects. Mild ankle swelling likely positional. - Ordered blood work: glucose, renal/hepatic function, lipid panel, anemia screening. - Continue Synjardy  12.5/500 mg BID. - Continue rosuvastatin . - Continue Micardis  20 mg. - Monitor kidney function and microalbuminuria. - Advised on dietary calcium  and vitamin D. - Discussed non-pharmacological facial hair removal.  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 Previous WBC count normal. - Ordered annual blood work for WBC monitoring.  Osteopenia of lumbar spine Osteopenia with 10-year major fracture risk 5.4%, hip fracture risk 1.1%.  - Encouraged physical activity, high calcium  diet. - Recommended daily vitamin D supplementation.  Chronic midline low back pain Intermittent, non-severe pain possibly posture-related. No radicular  symptoms. - Continue current management without pharmacological intervention.        [1]  Current Outpatient Medications:    Empagliflozin -metFORMIN  HCl (SYNJARDY ) 12.5-500 MG TABS, Take 1 tablet by mouth 2 (two) times daily., Disp: 180 tablet, Rfl: 3   glucose blood (ONE TOUCH ULTRA TEST) test strip, Use as instructed, Disp: 100 each, Rfl: 12   latanoprost (XALATAN) 0.005 % ophthalmic solution, Place 1 drop into both eyes at bedtime., Disp: , Rfl:    MULTIPLE VITAMIN PO, Take by mouth., Disp: , Rfl:    rosuvastatin  (CRESTOR ) 40 MG tablet, Take 1 tablet (40 mg total) by mouth daily., Disp: 90 tablet, Rfl: 3   SIMBRINZA 1-0.2 % SUSP, Apply to eye., Disp: , Rfl:    telmisartan  (MICARDIS ) 20 MG tablet, Take 1 tablet (20 mg total) by mouth daily., Disp: 90 tablet, Rfl: 1   VITAMIN D PO, Take 1,000 Units by mouth daily., Disp: , Rfl:  [2]  Allergies Allergen Reactions   Clindamycin/Lincomycin Nausea Only

## 2024-05-29 LAB — LIPID PANEL
Cholesterol: 157 mg/dL
HDL: 74 mg/dL
LDL Cholesterol (Calc): 64 mg/dL
Non-HDL Cholesterol (Calc): 83 mg/dL
Total CHOL/HDL Ratio: 2.1 (calc)
Triglycerides: 102 mg/dL

## 2024-05-29 LAB — CBC WITH DIFFERENTIAL/PLATELET
Absolute Lymphocytes: 983 {cells}/uL (ref 850–3900)
Absolute Monocytes: 378 {cells}/uL (ref 200–950)
Basophils Absolute: 19 {cells}/uL (ref 0–200)
Basophils Relative: 0.7 %
Eosinophils Absolute: 19 {cells}/uL (ref 15–500)
Eosinophils Relative: 0.7 %
HCT: 39.6 % (ref 35.9–46.0)
Hemoglobin: 12.8 g/dL (ref 11.7–15.5)
MCH: 30.7 pg (ref 27.0–33.0)
MCHC: 32.3 g/dL (ref 31.6–35.4)
MCV: 95 fL (ref 81.4–101.7)
MPV: 11.4 fL (ref 7.5–12.5)
Monocytes Relative: 14 %
Neutro Abs: 1301 {cells}/uL — ABNORMAL LOW (ref 1500–7800)
Neutrophils Relative %: 48.2 %
Platelets: 205 Thousand/uL (ref 140–400)
RBC: 4.17 Million/uL (ref 3.80–5.10)
RDW: 13.6 % (ref 11.0–15.0)
Total Lymphocyte: 36.4 %
WBC: 2.7 Thousand/uL — ABNORMAL LOW (ref 3.8–10.8)

## 2024-05-29 LAB — MICROALBUMIN / CREATININE URINE RATIO
Creatinine, Urine: 50 mg/dL (ref 20–275)
Microalb Creat Ratio: 100 mg/g{creat} — ABNORMAL HIGH
Microalb, Ur: 5 mg/dL

## 2024-05-29 LAB — COMPREHENSIVE METABOLIC PANEL WITH GFR
AG Ratio: 1.6 (calc) (ref 1.0–2.5)
ALT: 22 U/L (ref 6–29)
AST: 21 U/L (ref 10–35)
Albumin: 4.3 g/dL (ref 3.6–5.1)
Alkaline phosphatase (APISO): 82 U/L (ref 37–153)
BUN: 12 mg/dL (ref 7–25)
CO2: 29 mmol/L (ref 20–32)
Calcium: 9.3 mg/dL (ref 8.6–10.4)
Chloride: 108 mmol/L (ref 98–110)
Creat: 0.6 mg/dL (ref 0.60–0.95)
Globulin: 2.7 g/dL (ref 1.9–3.7)
Glucose, Bld: 139 mg/dL — ABNORMAL HIGH (ref 65–99)
Potassium: 3.6 mmol/L (ref 3.5–5.3)
Sodium: 142 mmol/L (ref 135–146)
Total Bilirubin: 0.7 mg/dL (ref 0.2–1.2)
Total Protein: 7 g/dL (ref 6.1–8.1)
eGFR: 90 mL/min/1.73m2

## 2024-05-30 ENCOUNTER — Ambulatory Visit: Payer: Self-pay | Admitting: Family Medicine

## 2024-05-30 ENCOUNTER — Other Ambulatory Visit: Payer: Self-pay | Admitting: Family Medicine

## 2024-05-30 DIAGNOSIS — D708 Other neutropenia: Secondary | ICD-10-CM

## 2024-09-27 ENCOUNTER — Ambulatory Visit: Admitting: Family Medicine

## 2025-04-04 ENCOUNTER — Ambulatory Visit
